# Patient Record
Sex: Male | Born: 1935 | Race: White | Hispanic: No | Marital: Married | Smoking: Former smoker
Health system: Southern US, Community
[De-identification: ages and names within clinical notes are randomized; demographics above are authoritative.]

## PROBLEM LIST (undated history)

## (undated) DIAGNOSIS — B019 Varicella without complication: Secondary | ICD-10-CM

## (undated) DIAGNOSIS — I10 Essential (primary) hypertension: Secondary | ICD-10-CM

## (undated) DIAGNOSIS — I2510 Atherosclerotic heart disease of native coronary artery without angina pectoris: Secondary | ICD-10-CM

## (undated) DIAGNOSIS — K219 Gastro-esophageal reflux disease without esophagitis: Secondary | ICD-10-CM

## (undated) DIAGNOSIS — Z85820 Personal history of malignant melanoma of skin: Secondary | ICD-10-CM

## (undated) DIAGNOSIS — I252 Old myocardial infarction: Secondary | ICD-10-CM

## (undated) DIAGNOSIS — N289 Disorder of kidney and ureter, unspecified: Secondary | ICD-10-CM

## (undated) DIAGNOSIS — N189 Chronic kidney disease, unspecified: Secondary | ICD-10-CM

## (undated) DIAGNOSIS — Z87440 Personal history of urinary (tract) infections: Secondary | ICD-10-CM

## (undated) DIAGNOSIS — B059 Measles without complication: Secondary | ICD-10-CM

## (undated) DIAGNOSIS — B269 Mumps without complication: Secondary | ICD-10-CM

## (undated) HISTORY — PX: OTHER SURGICAL HISTORY: SHX169

## (undated) HISTORY — DX: Mumps without complication: B26.9

## (undated) HISTORY — DX: Old myocardial infarction: I25.2

## (undated) HISTORY — PX: HIP SURGERY: SHX245

## (undated) HISTORY — DX: Measles without complication: B05.9

## (undated) HISTORY — DX: Varicella without complication: B01.9

## (undated) HISTORY — DX: Personal history of malignant melanoma of skin: Z85.820

## (undated) HISTORY — DX: Personal history of urinary (tract) infections: Z87.440

## (undated) SURGERY — DIALYSIS/PERMA CATHETER REMOVAL
Laterality: Right

---
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
## 1985-01-14 HISTORY — PX: OTHER SURGICAL HISTORY: SHX169

## 1996-12-22 HISTORY — PX: CORONARY ANGIOPLASTY: SHX604

## 1999-02-09 HISTORY — PX: OTHER SURGICAL HISTORY: SHX169

## 2000-12-05 HISTORY — PX: OTHER SURGICAL HISTORY: SHX169

## 2002-04-05 LAB — HM COLONOSCOPY

## 2002-05-25 HISTORY — PX: MELANOMA EXCISION: SHX5266

## 2006-07-14 ENCOUNTER — Ambulatory Visit: Payer: Self-pay

## 2006-11-10 ENCOUNTER — Ambulatory Visit: Payer: Self-pay | Admitting: Family Medicine

## 2006-11-24 DIAGNOSIS — E1121 Type 2 diabetes mellitus with diabetic nephropathy: Secondary | ICD-10-CM | POA: Insufficient documentation

## 2008-02-25 ENCOUNTER — Ambulatory Visit: Payer: Self-pay | Admitting: Family Medicine

## 2008-11-14 HISTORY — PX: TYMPANOPLASTY: SHX33

## 2009-01-25 ENCOUNTER — Ambulatory Visit: Payer: Self-pay | Admitting: Family Medicine

## 2009-02-09 ENCOUNTER — Ambulatory Visit: Payer: Self-pay | Admitting: Unknown Physician Specialty

## 2009-03-07 ENCOUNTER — Ambulatory Visit: Payer: Self-pay | Admitting: Unknown Physician Specialty

## 2010-08-02 ENCOUNTER — Ambulatory Visit: Payer: Self-pay | Admitting: Unknown Physician Specialty

## 2010-08-27 ENCOUNTER — Ambulatory Visit: Payer: Self-pay | Admitting: Unknown Physician Specialty

## 2010-08-27 DIAGNOSIS — N281 Cyst of kidney, acquired: Secondary | ICD-10-CM | POA: Insufficient documentation

## 2010-10-22 ENCOUNTER — Ambulatory Visit: Payer: Self-pay | Admitting: Vascular Surgery

## 2012-01-14 DIAGNOSIS — A048 Other specified bacterial intestinal infections: Secondary | ICD-10-CM | POA: Insufficient documentation

## 2012-02-15 HISTORY — PX: OTHER SURGICAL HISTORY: SHX169

## 2012-03-04 ENCOUNTER — Emergency Department: Payer: Self-pay | Admitting: Emergency Medicine

## 2012-03-04 LAB — CBC
HCT: 44.2 % (ref 40.0–52.0)
MCH: 29.6 pg (ref 26.0–34.0)
MCV: 89 fL (ref 80–100)
Platelet: 254 10*3/uL (ref 150–440)
RBC: 4.95 10*6/uL (ref 4.40–5.90)
RDW: 14.9 % — ABNORMAL HIGH (ref 11.5–14.5)

## 2012-03-04 LAB — COMPREHENSIVE METABOLIC PANEL
Albumin: 3.4 g/dL (ref 3.4–5.0)
Anion Gap: 9 (ref 7–16)
BUN: 29 mg/dL — ABNORMAL HIGH (ref 7–18)
Calcium, Total: 9 mg/dL (ref 8.5–10.1)
Creatinine: 1.84 mg/dL — ABNORMAL HIGH (ref 0.60–1.30)
EGFR (African American): 40 — ABNORMAL LOW
EGFR (Non-African Amer.): 35 — ABNORMAL LOW
Glucose: 176 mg/dL — ABNORMAL HIGH (ref 65–99)
Sodium: 143 mmol/L (ref 136–145)
Total Protein: 6.8 g/dL (ref 6.4–8.2)

## 2012-03-04 LAB — APTT: Activated PTT: 27.9 secs (ref 23.6–35.9)

## 2012-03-04 LAB — PROTIME-INR: Prothrombin Time: 12.6 secs (ref 11.5–14.7)

## 2012-03-04 LAB — CK TOTAL AND CKMB (NOT AT ARMC)
CK, Total: 58 U/L (ref 35–232)
CK-MB: 2 ng/mL (ref 0.5–3.6)

## 2012-03-04 LAB — TROPONIN I: Troponin-I: <0.02 ng/mL

## 2012-05-14 DIAGNOSIS — I729 Aneurysm of unspecified site: Secondary | ICD-10-CM | POA: Insufficient documentation

## 2012-05-14 DIAGNOSIS — I739 Peripheral vascular disease, unspecified: Secondary | ICD-10-CM | POA: Insufficient documentation

## 2012-05-14 DIAGNOSIS — I714 Abdominal aortic aneurysm, without rupture: Secondary | ICD-10-CM | POA: Insufficient documentation

## 2012-05-20 ENCOUNTER — Ambulatory Visit: Payer: Self-pay | Admitting: Cardiology

## 2012-10-05 ENCOUNTER — Other Ambulatory Visit: Payer: Self-pay | Admitting: Unknown Physician Specialty

## 2012-10-05 LAB — CLOSTRIDIUM DIFFICILE BY PCR

## 2012-10-07 LAB — STOOL CULTURE

## 2013-02-01 ENCOUNTER — Ambulatory Visit (INDEPENDENT_AMBULATORY_CARE_PROVIDER_SITE_OTHER): Payer: Medicare HMO | Admitting: Podiatry

## 2013-02-01 ENCOUNTER — Ambulatory Visit (INDEPENDENT_AMBULATORY_CARE_PROVIDER_SITE_OTHER): Payer: Medicare HMO

## 2013-02-01 ENCOUNTER — Encounter: Payer: Self-pay | Admitting: Podiatry

## 2013-02-01 VITALS — BP 188/86 | HR 51 | Resp 16 | Ht 71.0 in | Wt 163.0 lb

## 2013-02-01 DIAGNOSIS — M7750 Other enthesopathy of unspecified foot: Secondary | ICD-10-CM

## 2013-02-01 DIAGNOSIS — M778 Other enthesopathies, not elsewhere classified: Secondary | ICD-10-CM

## 2013-02-01 DIAGNOSIS — M109 Gout, unspecified: Secondary | ICD-10-CM

## 2013-02-01 DIAGNOSIS — M79671 Pain in right foot: Secondary | ICD-10-CM

## 2013-02-01 DIAGNOSIS — M779 Enthesopathy, unspecified: Secondary | ICD-10-CM

## 2013-02-01 DIAGNOSIS — M79609 Pain in unspecified limb: Secondary | ICD-10-CM

## 2013-02-01 NOTE — Progress Notes (Signed)
   Subjective:    Patient ID: John Robinson, male    DOB: [birth year], 80 y.o.   MRN: [id number]  HPI Comments: Its my right foot and ankle. It hurts and it was swollen. The swelling has gotten better. The swelling happened over night. i cant wear dress shoes only sneakers. All this started happening two weeks ago yesterday. i seen dr Caryn Section and he rubbed it and had blood work done. i went back to him for the results and he said it was infection in my foot or ankle. Dr Caryn Section gave me cephalexin for my foot.  Foot Pain Associated symptoms include coughing and headaches.      Review of Systems  HENT: Positive for sinus pressure and trouble swallowing.   Eyes: Negative.   Respiratory: Positive for cough.   Cardiovascular:       Calf pain when walking  Gastrointestinal: Positive for diarrhea.  Endocrine: Negative.   Genitourinary: Negative.   Musculoskeletal:       Joint pain Difficulty walking  Skin: Negative.   Allergic/Immunologic: Negative.   Neurological: Positive for headaches.  Hematological: Negative.   Psychiatric/Behavioral: Negative.        Objective:   Physical Exam: I have reviewed his past medical history medications allergies surgeries social history. His vital signs are slightly elevated today with mild increase in blood pressure. He states that this is been continuing to increase over the past few weeks. Pulses are strongly palpable right foot diminished left foot. He does have a history of peripheral vascular disease. Neurologic sensorium is intact bilateral. Deep tendon reflexes are intact bilateral muscle strength is 5 over 5 dorsiflexors plantar flexors inverters everters all intrinsic musculature is intact. Orthopedic evaluation demonstrates some tenderness on palpation overlying the lateral ankle and the dorsal lateral aspect of the right foot. Radiographic evaluation does not demonstrate any type of osseous abnormalities other than some early osteoarthritic  changes about the distal fibula. The foot is mildly warm to touch but the patient states that his for ever getting better. Radiographic evaluation does not demonstrate any major anomalies.        Assessment & Plan:  Assessment: Probable gouty capsulitis right lateral foot and ankle. Care rule out some type of associated hypertension. Concerned about renal artery stenosis possibly resulting in his hypertension.  Plan: We discussed the etiology pathology conservative versus surgical therapies at this point with this ever getting better I don't think there is any reason to treat the foot. However I did suggest that the hydrochlorothiazide may be a culprit. I also suggested that he followup with his primary Dr. or his vascular surgeon for his hypertension and to continue checking the blood pressure daily and making notes of it.

## 2013-02-02 ENCOUNTER — Emergency Department: Payer: Self-pay | Admitting: Emergency Medicine

## 2013-02-02 LAB — BASIC METABOLIC PANEL
Anion Gap: 4 — ABNORMAL LOW (ref 7–16)
BUN: 38 mg/dL — AB (ref 7–18)
CO2: 26 mmol/L (ref 21–32)
Calcium, Total: 9.5 mg/dL (ref 8.5–10.1)
Chloride: 106 mmol/L (ref 98–107)
Creatinine: 2.11 mg/dL — ABNORMAL HIGH (ref 0.60–1.30)
EGFR (African American): 34 — ABNORMAL LOW
EGFR (Non-African Amer.): 29 — ABNORMAL LOW
Glucose: 149 mg/dL — ABNORMAL HIGH (ref 65–99)
Osmolality: 284 (ref 275–301)
Potassium: 4.4 mmol/L (ref 3.5–5.1)
Sodium: 136 mmol/L (ref 136–145)

## 2013-02-02 LAB — CBC WITH DIFFERENTIAL/PLATELET
BASOS ABS: 0.1 10*3/uL (ref 0.0–0.1)
Basophil %: 0.9 %
Eosinophil #: 0.2 10*3/uL (ref 0.0–0.7)
Eosinophil %: 2.2 %
HCT: 39.5 % — ABNORMAL LOW (ref 40.0–52.0)
HGB: 13.3 g/dL (ref 13.0–18.0)
LYMPHS ABS: 2 10*3/uL (ref 1.0–3.6)
Lymphocyte %: 20.4 %
MCH: 30.1 pg (ref 26.0–34.0)
MCHC: 33.5 g/dL (ref 32.0–36.0)
MCV: 90 fL (ref 80–100)
MONOS PCT: 6.3 %
Monocyte #: 0.6 x10 3/mm (ref 0.2–1.0)
Neutrophil #: 7 10*3/uL — ABNORMAL HIGH (ref 1.4–6.5)
Neutrophil %: 70.2 %
PLATELETS: 356 10*3/uL (ref 150–440)
RBC: 4.4 10*6/uL (ref 4.40–5.90)
RDW: 14.2 % (ref 11.5–14.5)
WBC: 10 10*3/uL (ref 3.8–10.6)

## 2013-03-26 LAB — HEMOGLOBIN A1C, FINGERSTICK: Hemoglobin A1C: 6.2

## 2013-05-05 ENCOUNTER — Ambulatory Visit: Payer: Self-pay | Admitting: Family Medicine

## 2013-05-19 ENCOUNTER — Ambulatory Visit: Payer: Self-pay | Admitting: Family Medicine

## 2013-06-23 ENCOUNTER — Ambulatory Visit: Payer: Self-pay | Admitting: Family Medicine

## 2013-07-09 LAB — HEMOGLOBIN A1C, FINGERSTICK
HEMOGLOBIN A1C: 6.4
Microalb, Ur: 50

## 2013-07-14 ENCOUNTER — Ambulatory Visit: Payer: Self-pay | Admitting: Family Medicine

## 2013-10-05 LAB — HEMOGLOBIN A1C, FINGERSTICK: HEMOGLOBIN A1C: 5.7

## 2013-11-04 ENCOUNTER — Ambulatory Visit: Payer: Self-pay | Admitting: Vascular Surgery

## 2013-11-04 LAB — CBC WITH DIFFERENTIAL/PLATELET
Basophil #: 0.1 10*3/uL (ref 0.0–0.1)
Basophil %: 0.8 %
EOS ABS: 0.3 10*3/uL (ref 0.0–0.7)
Eosinophil %: 4.4 %
HCT: 30.2 % — AB (ref 40.0–52.0)
HGB: 9.9 g/dL — ABNORMAL LOW (ref 13.0–18.0)
LYMPHS ABS: 1.5 10*3/uL (ref 1.0–3.6)
Lymphocyte %: 19.5 %
MCH: 29.3 pg (ref 26.0–34.0)
MCHC: 32.7 g/dL (ref 32.0–36.0)
MCV: 90 fL (ref 80–100)
Monocyte #: 0.6 x10 3/mm (ref 0.2–1.0)
Monocyte %: 8 %
Neutrophil #: 5 10*3/uL (ref 1.4–6.5)
Neutrophil %: 67.3 %
Platelet: 233 10*3/uL (ref 150–440)
RBC: 3.37 10*6/uL — AB (ref 4.40–5.90)
RDW: 14.4 % (ref 11.5–14.5)
WBC: 7.5 10*3/uL (ref 3.8–10.6)

## 2013-11-04 LAB — BASIC METABOLIC PANEL
Anion Gap: 9 (ref 7–16)
BUN: 65 mg/dL — ABNORMAL HIGH (ref 7–18)
CO2: 23 mmol/L (ref 21–32)
Calcium, Total: 8.4 mg/dL — ABNORMAL LOW (ref 8.5–10.1)
Chloride: 112 mmol/L — ABNORMAL HIGH (ref 98–107)
Creatinine: 4.02 mg/dL — ABNORMAL HIGH (ref 0.60–1.30)
EGFR (Non-African Amer.): 15 — ABNORMAL LOW
GFR CALC AF AMER: 19 — AB
Glucose: 179 mg/dL — ABNORMAL HIGH (ref 65–99)
Osmolality: 310 (ref 275–301)
POTASSIUM: 4.6 mmol/L (ref 3.5–5.1)
Sodium: 144 mmol/L (ref 136–145)

## 2013-11-04 LAB — PROTIME-INR
INR: 1
PROTHROMBIN TIME: 12.9 s (ref 11.5–14.7)

## 2013-11-11 ENCOUNTER — Ambulatory Visit: Payer: Self-pay | Admitting: Vascular Surgery

## 2013-11-28 ENCOUNTER — Emergency Department: Payer: Self-pay | Admitting: Emergency Medicine

## 2013-11-28 LAB — COMPREHENSIVE METABOLIC PANEL
ALK PHOS: 166 U/L — AB
ALT: 20 U/L
ANION GAP: 7 (ref 7–16)
AST: 24 U/L (ref 15–37)
Albumin: 3.3 g/dL — ABNORMAL LOW (ref 3.4–5.0)
BILIRUBIN TOTAL: 0.4 mg/dL (ref 0.2–1.0)
BUN: 70 mg/dL — ABNORMAL HIGH (ref 7–18)
CALCIUM: 8.7 mg/dL (ref 8.5–10.1)
CHLORIDE: 112 mmol/L — AB (ref 98–107)
Co2: 25 mmol/L (ref 21–32)
Creatinine: 4.39 mg/dL — ABNORMAL HIGH (ref 0.60–1.30)
EGFR (African American): 17 — ABNORMAL LOW
EGFR (Non-African Amer.): 14 — ABNORMAL LOW
GLUCOSE: 148 mg/dL — AB (ref 65–99)
Osmolality: 310 (ref 275–301)
Potassium: 4.3 mmol/L (ref 3.5–5.1)
Sodium: 144 mmol/L (ref 136–145)
Total Protein: 7 g/dL (ref 6.4–8.2)

## 2013-11-28 LAB — PROTIME-INR
INR: 0.9
Prothrombin Time: 12.3 secs (ref 11.5–14.7)

## 2013-11-28 LAB — CBC
HCT: 36.6 % — ABNORMAL LOW (ref 40.0–52.0)
HGB: 11.7 g/dL — ABNORMAL LOW (ref 13.0–18.0)
MCH: 28.7 pg (ref 26.0–34.0)
MCHC: 31.9 g/dL — ABNORMAL LOW (ref 32.0–36.0)
MCV: 90 fL (ref 80–100)
Platelet: 307 10*3/uL (ref 150–440)
RBC: 4.06 10*6/uL — ABNORMAL LOW (ref 4.40–5.90)
RDW: 14.1 % (ref 11.5–14.5)
WBC: 11.6 10*3/uL — ABNORMAL HIGH (ref 3.8–10.6)

## 2013-11-28 LAB — TROPONIN I: Troponin-I: <0.02 ng/mL

## 2013-11-28 LAB — APTT: Activated PTT: 30.9 secs (ref 23.6–35.9)

## 2014-02-02 LAB — HEMOGLOBIN A1C, FINGERSTICK: Hemoglobin A1C: 6.4

## 2014-03-18 LAB — LIPID PANEL
BUN: 68
CREATININE: 5.66
Glucose: 188
HCT: 30.7
HEMOGLOBIN A1C: 6.3
HGB: 10 g/dL
PLATELETS: 217
POTASSIUM: 5.1 mmol/L
Sodium: 140
WBC: 8.5

## 2014-03-30 ENCOUNTER — Inpatient Hospital Stay: Payer: Self-pay | Admitting: Internal Medicine

## 2014-04-01 DIAGNOSIS — R079 Chest pain, unspecified: Secondary | ICD-10-CM | POA: Diagnosis not present

## 2014-04-06 DIAGNOSIS — I745 Embolism and thrombosis of iliac artery: Secondary | ICD-10-CM | POA: Insufficient documentation

## 2014-04-06 DIAGNOSIS — Z9889 Other specified postprocedural states: Secondary | ICD-10-CM | POA: Insufficient documentation

## 2014-04-06 DIAGNOSIS — I219 Acute myocardial infarction, unspecified: Secondary | ICD-10-CM | POA: Insufficient documentation

## 2014-04-08 ENCOUNTER — Ambulatory Visit: Payer: Self-pay | Admitting: Family Medicine

## 2014-04-10 ENCOUNTER — Emergency Department: Payer: Self-pay | Admitting: Emergency Medicine

## 2014-04-10 LAB — CBC
HCT: 29.2 % — ABNORMAL LOW (ref 40.0–52.0)
HGB: 9.7 g/dL — AB (ref 13.0–18.0)
MCH: 29.2 pg (ref 26.0–34.0)
MCHC: 33 g/dL (ref 32.0–36.0)
MCV: 89 fL (ref 80–100)
Platelet: 262 10*3/uL (ref 150–440)
RBC: 3.3 10*6/uL — ABNORMAL LOW (ref 4.40–5.90)
RDW: 15.4 % — AB (ref 11.5–14.5)
WBC: 9.1 10*3/uL (ref 3.8–10.6)

## 2014-04-10 LAB — BASIC METABOLIC PANEL
Anion Gap: 9 (ref 7–16)
BUN: 99 mg/dL — ABNORMAL HIGH
CHLORIDE: 112 mmol/L — AB
CO2: 20 mmol/L — AB
CREATININE: 7.13 mg/dL — AB
Calcium, Total: 9 mg/dL
EGFR (Non-African Amer.): 7 — ABNORMAL LOW
GFR CALC AF AMER: 8 — AB
GLUCOSE: 164 mg/dL — AB
POTASSIUM: 4.7 mmol/L
Sodium: 141 mmol/L

## 2014-04-10 LAB — PROTIME-INR
INR: 0.9
Prothrombin Time: 12 secs

## 2014-04-10 LAB — TROPONIN I: TROPONIN-I: 0.03 ng/mL

## 2014-04-11 LAB — CBC WITH DIFFERENTIAL/PLATELET
BASOS ABS: 0.1 10*3/uL (ref 0.0–0.1)
Basophil %: 0.9 %
EOS PCT: 4.4 %
Eosinophil #: 0.3 10*3/uL (ref 0.0–0.7)
HCT: 28.2 % — AB (ref 40.0–52.0)
HGB: 8.8 g/dL — AB (ref 13.0–18.0)
LYMPHS PCT: 17 %
Lymphocyte #: 1.2 10*3/uL (ref 1.0–3.6)
MCH: 28.3 pg (ref 26.0–34.0)
MCHC: 31.1 g/dL — ABNORMAL LOW (ref 32.0–36.0)
MCV: 91 fL (ref 80–100)
Monocyte #: 0.6 x10 3/mm (ref 0.2–1.0)
Monocyte %: 8.7 %
NEUTROS PCT: 69 %
Neutrophil #: 4.9 10*3/uL (ref 1.4–6.5)
Platelet: 236 10*3/uL (ref 150–440)
RBC: 3.1 10*6/uL — AB (ref 4.40–5.90)
RDW: 16.1 % — ABNORMAL HIGH (ref 11.5–14.5)
WBC: 7.2 10*3/uL (ref 3.8–10.6)

## 2014-04-11 LAB — TROPONIN I: TROPONIN-I: 0.05 ng/mL — AB

## 2014-04-11 LAB — APTT: Activated PTT: 33.2 secs (ref 23.6–35.9)

## 2014-04-11 LAB — HEPARIN LEVEL (UNFRACTIONATED): Anti-Xa(Unfractionated): 2 IU/mL — ABNORMAL HIGH (ref 0.30–0.70)

## 2014-04-12 DIAGNOSIS — I2510 Atherosclerotic heart disease of native coronary artery without angina pectoris: Secondary | ICD-10-CM | POA: Insufficient documentation

## 2014-04-12 DIAGNOSIS — I160 Hypertensive urgency: Secondary | ICD-10-CM | POA: Insufficient documentation

## 2014-04-12 DIAGNOSIS — N186 End stage renal disease: Secondary | ICD-10-CM | POA: Insufficient documentation

## 2014-04-12 DIAGNOSIS — R079 Chest pain, unspecified: Secondary | ICD-10-CM | POA: Insufficient documentation

## 2014-05-02 ENCOUNTER — Ambulatory Visit
Admit: 2014-05-02 | Disposition: A | Discharge status: Home / Self Care | Payer: Self-pay | Attending: Vascular Surgery | Admitting: Vascular Surgery

## 2014-05-07 NOTE — Op Note (Signed)
PATIENT NAME:  John Robinson, SIPPEL MR#:  [id number] DATE OF BIRTH:  1932-02-06  DATE OF PROCEDURE:  11/11/2013  PREOPERATIVE DIAGNOSES: 1.  Chronic kidney disease nearing dialysis dependence.  2.  Abdominal aortic aneurysm.  3.  Peripheral vascular disease.  4.  Hypertension.   POSTOPERATIVE DIAGNOSES: 1.  Chronic kidney disease nearing dialysis dependence.  2.  Abdominal aortic aneurysm.  3.  Peripheral vascular disease.  4.  Hypertension.   PROCEDURE:  Left brachiocephalic arteriovenous fistula creation.   SURGEON:  Algernon Huxley, MD   ANESTHESIA:  General.   ESTIMATED BLOOD LOSS:  25 mL.   INDICATION FOR PROCEDURE:  This is a 81 year old gentleman with chronic kidney disease nearing dialysis dependence. He was referred to [place] for evaluation for AV fistula creation. We planned left arm AV fistula creation. On his noninvasive studies, his cephalic vein looked marginal or unusable with basilic vein that looked to be adequate. I have told him we would explore his left arm, and if the cephalic vein was usable, we would use this, and if not, we would plan on placing a basilic AV fistula, which would require a second stage operation. He agreed with proceeding. Risks and benefits were discussed. Informed consent was obtained.   DESCRIPTION OF PROCEDURE:  The patient was brought to the operative suite, and after an adequate level of general anesthesia was obtained, the left upper extremity was sterilely prepped and draped and a sterile surgical field was created. A small incision was created at the antecubital fossa, and I identified a basilic vein which was usable. I then extended the incision about 1 to 1.5 cm laterally to evaluate the cephalic vein. The median antecubital vein and the cephalic vein came over. The cephalic vein actually looked like a potentially usable vein. It was a little smaller than the basilic vein but appeared to be of reasonable size. I elected to explore this and pass dilators,  and if this were usable, use this for fistula creation. The brachial artery was dissected out and encircled with vessel loops proximally and distally. Control was then pulled up on the vessel loops, and and anterior wall arteriotomy was created with an 11 blade and extended with Potts scissors. The patient was given 3000 units of intravenous heparin. Two cephalic vein branches were ligated and divided between silk ties. It was ligated distally in the median antecubital vein prior to its joining the basilic vein. It was marked for orientation. I then sequentially passed [facility] dilators up to 3 mm without any difficulty and felt this would be our best option for fistula creation. It was cut and beveled to match the arteriotomy, and both vessels were locally heparinized. An anastomosis was created with a running 6-0 Prolene suture in the usual fashion. The vessel was flushed and de-aired prior to release of control. Two 6-0 Prolene patch sutures were used for hemostasis, and hemostasis was achieved. Surgicel was placed. There was an excellent thrill present within the AV fistula and a good pulse in the brachial artery distally. At this point, I elected to terminate the procedure. The wound was closed with 3-0 Vicryl and 4-0 Monocryl. Dermabond was placed as a dressing. The patient was awakened from anesthesia and taken to the recovery room in stable condition, having tolerated the procedure well.    ____________________________ Algernon Huxley, MD jsd:nb D: 11/11/2013 14:31:24 ET T: 11/12/2013 00:21:28 ET JOB#: [id number]  cc: Algernon Huxley, MD, <Dictator> Algernon Huxley MD ELECTRONICALLY SIGNED 11/17/2013 8:56

## 2014-05-15 NOTE — Discharge Summary (Signed)
PATIENT NAME:  John Robinson, John Robinson MR#:  [id number] DATE OF BIRTH:  Sep 19, 1932  DATE OF ADMISSION:  03/30/2014 DATE OF DISCHARGE:  04/02/2014  ADMITTING DIAGNOSIS:   Malignant hypertension.   DISCHARGE DIAGNOSES:    1. Malignant essential hypertension.  2. Hypertensive encephalopathy with headache.  3. Hyperkalemia due to renal failure. 4. Acidosis due to renal failure.  5. Chest pain with hypertension episodes while in the hospital, resolved.  6. Normal echocardiogram. 7. History of hypertension. 8. History of hyperlipidemia. 9. History of coronary artery disease status post coronary artery bypass grafting.  10. Peripheral vascular disease status post multiple bypass surgeries.  11. Tobacco abuse in remote past   12. Renal artery stenosis status post stent placement in the remote past. 13. Chronic kidney disease stage V. 14. History of gout, stable.    DISCHARGE CONDITION:  Stable.   DISCHARGE MEDICATIONS:  The patient is to continue omeprazole 20 mg p.o. daily as needed, Brilinta 90 mg twice daily, clonidine 0.1 mg 3 times daily, cilostazol 100 mg twice daily, Nitrostat 0.4 mg sublingual every 5 minutes as needed, atorvastatin 80 mg p.o. daily,  glipizide 5 mg p.o. daily, Welchol 625 mg 3 tablets twice daily, multivitamin once daily, aspirin 81 mg p.o. daily, sodium bicarbonate 650 mg twice daily, labetalol 100 mg twice daily.   The patient is not to take metoprolol or hydrochlorothiazide.     HOME OXYGEN:   None.  DIET:   Low sodium, low fat, low cholesterol, carbohydrate controlled diet, regular consistency.  ACTIVITY LIMITATIONS:  As tolerated.   FOLLOWUP APPOINTMENT: With Dr. Caryn Section in 2 days after discharge, Dr. Merita Norton 2 days after discharge, primary cardiology in 2 days after discharge.    CONSULTANTS:  Care management, social work, Dr. Candiss Norse.   RADIOLOGIC STUDIES:   Chest x-ray PA and lateral 03/30/2014, revealing chronic bronchitic changes without acute abnormality.    The  patient is a 81 year old Caucasian male with past medical history significant for history of CKD who presents to the hospital with complaints of elevated blood pressure as well as headaches. Please refer to Dr. Keenan Bachelor admission note on 03/30/2014.  On arrival to the hospital, the patient's vital signs:  Temperature was 97.9, pulse was 64, respiratory rate was 18, blood pressure 214/74.  O2 saturations were 100% on room air.   Physical exam was unremarkable.   The patient's lab data done on arrival to the Emergency Room revealed BUN and creatinine of 81 and 6.45, glucose 146, potassium 5.3, bicarbonate 18. Calcium level was low at 8.8. Liver enzymes: Albumin level of 2.9, otherwise liver enzymes were unremarkable. Cardiac enzymes x 5 were within normal limits.  The patient's white blood cell count was normal at 9. Hemoglobin was 10. Platelet count was 266,000.   Absolute neutrophil count was 6.6. Urinalysis revealed more than 500 protein, 1 red blood cell, 1 white blood cell, no bacteria, less than 1 epithelial cell was found.  EKG showed sinus rhythm with first degree AV block, right bundle branch block, as well as right anterior fascicular block.   The patient's radiologic studies, chest x-ray, showed chronic bronchitic changes.  The patient was admitted to the hospital for further evaluation.  He was started on labetalol and hydralazine and was initiated on diuretic Lasix. With this, his condition significantly improved.   His blood pressure improved and he felt much more comfortable.   With blood pressure improvement, his headache also resolved.  He was seen and followed by Dr. Merita Norton while  in the hospital and felt that patient is approaching very closely to end-stage renal disease, need for hemodialysis, although no hemodialysis was performed while he was in the hospital.  Since the patient's malignant essential hypertension resolved and his condition overall improved, it was felt the patient is stable to be  discharged home today on 04/02/2014.  He is to continue on medications for blood pressure management. In regards to hyperkalemia, the patient's hyperkalemia resolved with diuretic Lasix. It is recommended to initiate the patient on Lasix if needed if he develops more swelling or more acceleration of blood pressure. In regards to acidosis, the patient's acidosis improved with bicarbonate orally. The patient is to continue bicarbonate at home and followup with nephrologist for further recommendations.  Regarding chest pains, the patient developed an episode of hypertension while he was in the hospital and with that, he had mild chest discomfort. His cardiac enzymes were cycled and they were completely within normal limits.   The patient was seen by Dr. Clayborn Bigness although no note by Dr. Clayborn Bigness was available.  I  had a chance to discuss the case with DrClayborn Bigness who felt the patient should followup with his primary cardiologist for further recommendations, possible stress test as outpatient.   The patient did have echocardiogram while he was in the hospital which revealed ejection fraction of 65-70%, normal global left ventricular systolic function, impaired relaxation pattern of left ventricular diastolic filling, mild concentric left ventricular hypertrophy was noted as well as mildly increased left ventricular posterior wall thickness.  No changes in medication regimen except of blood pressure management were recommended by Dr. Clayborn Bigness.   In regards to his chronic medical problems such as hyperlipidemia, coronary artery disease, peripheral vascular disease, CKD, gout, the patient is to continue his outpatient management and followup.   The patient is being discharged in stable condition with the above mentioned medications and followup.   On the date of discharge, temperature was 97.6, pulse was 76, respiration equals 18, blood pressure ranging from 09-735 systolic and 32-99 diastolic.  O2 saturations were 97-98%  on room air at rest as well as on exertion.     TIME SPENT:   Forty minutes.   CC:   Primary cardiology.    ____________________________ Theodoro Grist, MD rv:tr D: 04/02/2014 16:12:21 ET T: 04/02/2014 16:18:15 ET JOB#: [id number]  cc: Theodoro Grist, MD, <Dictator> Kirstie Peri. Caryn Section, MD Darrick Penna. Woodfin Ganja, MD (Dictation Anomaly)  Tiye Huwe MD ELECTRONICALLY SIGNED 04/17/2014 20:38

## 2014-05-15 NOTE — Op Note (Signed)
PATIENT NAME:  John Robinson, John Robinson MR#:  [id number] DATE OF BIRTH:  December 20, 1932  DATE OF PROCEDURE:  05/02/2014  PREOPERATIVE DIAGNOSES: 1. End-stage renal disease.  2. Poorly maturing.  3. Infiltration with attempted dialysis left arm arteriovenous fistula.  4. Abdominal aortic aneurysm.  5. Peripheral arterial disease, status post previous surgery.  6. Coronary disease.  7. Hypertension.  8. Diabetes.   POSTOPERATIVE DIAGNOSES: 1. End-stage renal disease.  2. Poorly maturing.  3. Infiltration with attempted dialysis left arm arteriovenous fistula.  4. Abdominal aortic aneurysm.  5. Peripheral arterial disease, status post previous surgery.  6. Coronary disease.  7. Hypertension.  8. Diabetes.   PROCEDURES: 1. Ultrasound guidance for vascular access to left brachiocephalic arteriovenous fistula.  2. Left upper extremity fistulogram and central venogram.  3. Percutaneous transluminal angioplasty of proximal and mid upper arm cephalic vein with a 5 mm diameter angioplasty balloon.  4. Percutaneous transluminal angioplasty of cephalic vein and subclavian confluence with 5 mm diameter angioplasty balloon.  5. Viabahn covered stent placement to proximal to mid upper arm cephalic vein with 6 mm diameter x 10 cm length Viabahn covered stent for stenosis and extravasation after angioplasty.  6. Viabahn covered stent placement 6 mm diameter x 15 cm length covered stent to the cephalic vein and subclavian vein confluence and the proximal upper arm cephalic vein for greater than 50% residual stenosis in 2 locations after angioplasty.   SURGEON: Algernon Huxley, MD   ANESTHESIA: Local with moderate conscious sedation.   ESTIMATED BLOOD LOSS: 25 mL.   INDICATION FOR PROCEDURE: An 81 year old gentleman with end-stage renal disease. He had a left brachiocephalic AV fistula that was recently attempted to use for dialysis with immediate infiltration and poor maturation of the AV fistula. Even though it  had been  present for  several months, it was not yet usable for dialysis.  Fistulogram was performed for further evaluation and potential treatment. Risks and benefits were discussed. Informed consent was obtained.   DESCRIPTION OF PROCEDURE: The patient is brought to the vascular suite. The left upper extremity was sterilely prepped and draped and a sterile surgical field was created.  It was accessed near the arterial and venous anastomosis with a micropuncture needle under direct ultrasound guidance and a permanent image was recorded. A micropuncture wire and sheath were placed. Imaging through this showed a near occlusive stenosis in the proximal upper arm cephalic vein with collaterals around this. The vessel then continued with significant narrowing and tortuosity.  There was then a separate and distinct near occlusive stenosis within about 3 cm from the cephalic vein and subclavian vein confluence from the cephalic vein. The central venous circulation was then patent.  This was actually quite difficult to navigate through due to the small size of the vessel and the tortuosity.  Using Glidewire and a Kumpe catheter, I was able to navigate through the first stenosis below was unable to get  past the cephalic vein and subclavian confluence stenosis. I had to use a gold tip Glidewire and a Glide catheter to get across this and confirm intraluminal flow in the subclavian vein. I then replaced an 0.018 wire. A 5 mm diameter x 15 cm length angioplasty balloon was then taken.  I initially treated at the cephalic vein and subclavian vein confluence at burst pressure.  A narrowed area at this location would not budge and remained highly stenotic. I then transition to the separate and distinct location in the proximal and mid upper arm  cephalic vein. A 5 mm diameter angioplasty balloon was again inflated at about 12 to 14 atmospheres.  The waste broke; however, there was extravasation after angioplasty and residual  stenosis was seen. There was also the separate and distinct residual stenosis near the cephalic vein and subclavian confluence.  For both of these lesions, I elected to place two Viabahn covered stents.  A 6 mm diameter x 15 cm length Viabahn covered stent was placed within 1 cm of the cephalic vein and subclavian vein confluence and the cephalic vein encompassing the lesion and then a separate 6 mm diameter x 10 cm length Viabahn covered stent was placed in the proximal down to almost the mid upper arm cephalic vein to cover the area of extravasation and residual stenosis.  High pressure balloon was used to open the stent after placement and a completion angiogram following this showed some spasm in the vein just proximal to the stent were an area of stenosis was not present previously. This improved a little with some nitroglycerin and removal of the wire. The stents were now widely patent with the area of stenosis being resolved and the central venous circulation remained patent. At this point, I elected to terminate the procedure. The sheath was removed, 4-0 Monocryl pursestring suture was placed, pressure was held, sterile dressing was placed. The patient tolerated the procedure well and was taken to the recovery room in stable condition.    ____________________________ Algernon Huxley, MD jsd:tr D: 05/02/2014 17:04:00 ET T: 05/02/2014 20:30:15 ET JOB#: [id number]  cc: Algernon Huxley, MD, <Dictator> Algernon Huxley MD ELECTRONICALLY SIGNED 05/05/2014 12:54

## 2014-05-15 NOTE — H&P (Signed)
PATIENT NAME:  John Robinson, John Robinson MR#:  [id number] DATE OF BIRTH:  Jan 27, 1932  DATE OF ADMISSION:  03/30/2014  PRIMARY CARE PHYSICIAN: Kirstie Peri. Caryn Section, MD   CARDIOLOGIST: Isaias Cowman, MD    NEPHROLOGIST:  Dr. 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 Apparently patient was doing well up until the morning of this admission when he decided to go to his primary care physician, Dr. Caryn Section. He took his usual home medications, however, on arrival to PCP's office his blood pressure was noted to be high at 200s. He was sent home with instructions to take some clonidine. He returned home, took clonidine, however, his blood pressure remained  very high. He was also having headaches and decided to come to the Emergency Room for further evaluation. In the Emergency Room, his creatinine was noted to be 6.45. He was noted to be edematous with lower extremity swelling and his blood pressure remained high 214/74. Hospitalist services were contacted for admission.   PAST MEDICAL HISTORY: Significant for history of coronary artery disease, status post stents, history of aortic aneurysm operation for the same, history of peripheral vascular disease, status post iliofemoral bypass, since 1987 multiple procedures, hypertension, hyperlipidemia, history of carotid artery stenosis, history of CKD stage V.   SURGERIES:  Appendectomy in the remote past, 30 years ago patient had a hip fracture as well as arm fracture after fall, history of stenting in his lower extremities as well as in his kidneys as well as his heart.   ALLERGIES: None.   FAMILY HISTORY: No diabetes in patient's family. The patient's father had black lung, also lung  cancer, died of it. The patient's mother had pancreatic cancer.   SOCIAL HISTORY: The patient is married and has 2 children who live close by, used to smoke 1 pack a day for at least 60 years, quit 1 year ago. He was a Administrator.   REVIEW OF SYSTEMS:  CONSTITUTIONAL: Positive for headaches for the past 1 week, some blurring of vision intermittently, some sleepiness in the daytime and snoring, sinus congestion, coughing as well as intermittent wheezing, some phlegm production, which is white, admits of having some lower extremity swelling, bruising for the past 2 or 3 years. Denies fever, chills, fatigue, weakness, weight loss or gain.   EYES: Denies any double vision, glaucoma, or cataracts.   EARS: Denies tinnitus, sinus pains,  difficulty swallowing.  RESPIRATORY:  Denies any hemoptysis, asthma, COPD.  CARDIOVASCULAR: Denies any chest pains, orthopnea, arrhythmias, palpitations or syncope.  GASTROINTESTINAL: Denies any nausea, vomiting, diarrhea, or constipation.  GENITOURINARY: Denies dysuria, hematuria, frequency, incontinence.  ENDOCRINE: Denies any polydipsia, nocturia, thyroid problems, heat or cold intolerance or thirst.  HEMATOLOGIC: Denies anemia, easy bruising, bleeding, or swollen glands.  SKIN: Denies any acne, rashes, lesions, change in moles.  MUSCULOSKELETAL: Denies arthritis, cramps, swelling.  NEUROLOGIC: Denies numbness, epilepsy, or tremor.  PSYCHIATRIC: Denies anxiety, insomnia, depression.   PHYSICAL EXAMINATION:  VITAL SIGNS:  On arrival to the hospital patient's vital signs: Temperature was 97.9, pulse was 64, respiration rate was 18, blood pressure 170/73, however, upon further evaluation his blood pressure is 214/74, O2 saturations were 100% on room air.  GENERAL: This is a well-developed, well-nourished Caucasian male in no significant distress laying  on the stretcher.  HEENT: His pupils equal, reactive to light. Extraocular movements intact, no icterus , no  conjunctivitis. Has normal hearing. No pharyngeal erythema. Mucosa is moist.  NECK: No masses. Supple, nontender. Thyroid is not enlarged. No adenopathy. No JVD or carotid bruits bilaterally. Full range of motion.  LUNGS: Clear to auscultation but diminished breath sounds bilaterally especially posteriorly at bases.  No rales, rhonchi, or wheezing. No labored inspirations, increased dullness to percussion, not in overt respiratory distress.  CARDIOVASCULAR: S1, S2 appreciated. Rhythm is regular. Faint 2/6 systolic murmur was heard precordial.   CHEST:  Nontender to palpation.  EXTREMITIES:  Two plus lower extremity edema.  No calf tenderness or cyanosis was noted.  ABDOMEN: Soft, nontender. Bowel sounds normal. No splenomegaly or masses were noted.  RECTAL: Deferred.  MUSCLE STRENGTH: Able to move all extremities. No cyanosis, degenerative joint disease, or kyphosis. No CVA tenderness on percussion.  SKIN: Did not reveal any rashes, lesions, erythema, nodularity, induration. It was warm and dry to palpation. The patient did have lots of bruises throughout his upper extremities.  LYMPHATIC: No adenopathy in the cervical region.  NEUROLOGIC: Cranial nerves grossly intact. Sensory is intact. No dysarthria or aphasia. The patient is alert, oriented to time, person, and place, cooperative. Memory is somewhat impaired but no significant confusion, agitation, or depression noted.   EKG: Sinus rhythm with first degree AV block, right bundle branch block, left anterior fascicular block, nonspecific ST, T changes, no significant change since prior EKG done in November 2015.   LABORATORY DATA: BMP: Glucose of 146, BUN and creatinine were 81 and 6.45, potassium 5.3, bicarbonate 18, calcium 8.8. Liver enzymes not checked. Cardiac enzymes: Troponin was less than 0.03. White blood cell count is normal at 9.0, hemoglobin 10.0, platelet count is 256.  Absolute neutrophil count is 6.6. Urinalysis: Straw clear urine, 50  mg/dL of glucose, negative for bilirubin or ketones, specific gravity 1.005, pH was 5.0, 1+ blood, more than 500 protein, negative for nitrites or leukocyte esterase, 1 red blood cell, 1 white blood cell, no bacteria, less than 1 epithelial cell, amorphous crystals were also present.   RADIOLOGIC STUDIES: None.   ASSESSMENT AND PLAN:  1.  Malignant essential hypertension. Admit patient to medical floor. Start him on labetalol IV as well as orally, continue his outpatient medications, add metoprolol, also add hydralazine as needed.  2.  Headache likely hypertensive encephalopathy. Follow headaches clinically with blood pressure improvement.  3.  Acute on chronic renal failure. We will start patient on Lasix for fluid overload. We will follow patient's function closely. We will get nephrology consultation.  4.  Hyperkalemia. We will continue with Lasix and add bicarbonate, following potassium levels closely.  5.  Acidosis likely due to renal failure. Continue sodium bicarbonate orally when blood pressure is better controlled.   TIME SPENT: 50 minutes.    ____________________________ Theodoro Grist, MD rv:AT D: 03/30/2014 19:26:51 ET T: 03/31/2014 00:39:38 ET JOB#: [id number]  cc: Theodoro Grist, MD, <Dictator> Kirstie Peri. Caryn Section, MD  Theodoro Grist MD ELECTRONICALLY SIGNED 04/17/2014 20:30

## 2014-05-15 NOTE — Consult Note (Signed)
PATIENT NAME:  John Robinson, John Robinson MR#:  [id number] DATE OF BIRTH:  1932/07/05  DATE OF CONSULTATION:  04/01/2014  REFERRING PHYSICIAN:  Theodoro Grist, MD  CONSULTING PHYSICIAN:  Ramondo Dietze D. Clayborn Bigness, MD  PRIMARY PHYSICIAN: Kirstie Peri. Caryn Section, MD   CARDIOLOGIST: Isaias Cowman, MD  NEPHROLOGIST: Murlean Iba, MD   INDICATION: Hypertensive urgency and angina.   HISTORY OF PRESENT ILLNESS: The patient is a 81 year old white male with a history of chronic renal insufficiency, stage 5; a history of coronary artery disease; peripheral vascular disease, multiple interventions; hypertension; hyperlipidemia; who presented to the hospital with elevated blood pressure difficult to control at home, systolics of 416. Seen by his primary physician and advised to come to the Emergency Room. He was given clonidine with some improvement, but symptoms persisted. Noted to have elevated creatinine and lower leg swelling with his elevated blood pressure. Complains of vague chest pain symptoms and anginal symptoms as well.   PAST MEDICAL HISTORY: Coronary artery disease, peripheral vascular disease, aortic aneurysm, claudication, hypertension, hyperlipidemia, carotid stenosis, renal insufficiency.   PAST SURGICAL HISTORY: Appendectomy, arm surgery,  fracture, stenting of lower extremity, aneurysm repair.   ALLERGIES: None.   FAMILY HISTORY: Black lung, prostate cancer.   SOCIAL HISTORY: Married, 2 children. Long history of smoking but quit. Retired Administrator.  REVIEW OF SYSTEMS: No blackout spell, no syncope. He has had a headache. Some blurred vision. No nausea or vomiting. Denies fever, chills, or sweats. No weight loss, no weight gain. No hemoptysis, hematemesis. No bright red blood per rectum. No vision change or hearing change. Denies any significant sputum production or cough.   PHYSICAL EXAMINATION:  VITAL SIGNS: Blood pressure was 200/70, pulse of 65, respiratory rate of 14, afebrile.  HEENT:  Normocephalic, atraumatic. Pupils equal, round, reactive to light.  NECK: Supple. No significant JVD, bruits, or adenopathy.  LUNGS: Bilateral rhonchi. No wheezing or rales.  HEART: Regular rate and rhythm, S4, systolic ejection murmur at the apex. PMI nondisplaced.  ABDOMEN: Benign. EXTREMITIES: Bilateral decreased pulses with healed scars.  NEUROLOGIC: Intact.  SKIN: Normal.   EKG: Sinus rhythm, first-degree AV block, right bundle branch block, nonspecific ST-T wave changes, left anterior hemiblock. EKG essentially unchanged.  LABORATORY DATA: Glucose 146, BUN of 81, creatinine of 6.45, potassium 5.3, bicarbonate of 18, calcium 8.8. Troponin 0.03. White count of 9, hemoglobin of 10, platelet count of 256,000. UA unremarkable.   ASSESSMENT:  1.  Malignant hypertension.  2.  Headaches.  3.  Acute on chronic renal insufficiency.  4.  Hyperkalemia.  5.  Mild acidosis.  6.  Peripheral vascular disease.  7.  Chronic obstructive pulmonary disease.   PLAN: 1.  Recommend aggressive blood pressure therapy. Continue labetalol. Hydralazine also will be helpful. Continue metoprolol.  2.  For headaches, probably related to encephalopathy related to hypertension. Blood pressure control should be paramount. Mild to moderate pain, aggressive therapy intravenously will be helpful.  3.  For renal insufficiency, recommend nephrology input. The patient has evident dialysis in his future. Hopefully we will try to avoid that. Avoid ACE inhibitors for now, as well. 4.  Hyperkalemia. Lasix therapy. Add low-dose bicarbonate. If it does not respond, consider Kayexalate.  5.  Acidosis. Probably related to renal insufficiency. 6.  Continue blood pressure management and control as necessary. 7.  Inhalers as necessary for COPD symptoms. 8.  Consider statin therapy for hyperlipidemia. Treat the patient medically for now. Hopefully, the blood pressure will improve.    ____________________________ Loran Senters.  John Pryer,  MD ddc:ST D: 04/02/2014 18:36:00 ET T: 04/02/2014 22:46:01 ET JOB#: [id number]  cc: Smokey Melott D. Clayborn Bigness, MD, <Dictator> Yolonda Kida MD ELECTRONICALLY SIGNED 04/04/2014 13:33

## 2014-05-17 ENCOUNTER — Other Ambulatory Visit: Payer: Self-pay | Admitting: Vascular Surgery

## 2014-05-17 DIAGNOSIS — Z992 Dependence on renal dialysis: Principal | ICD-10-CM

## 2014-05-17 DIAGNOSIS — N186 End stage renal disease: Secondary | ICD-10-CM

## 2014-05-18 ENCOUNTER — Encounter
Admission: RE | Disposition: A | Discharge status: Home / Self Care | Payer: Commercial Managed Care - HMO | Source: Ambulatory Visit | Attending: Vascular Surgery

## 2014-05-18 ENCOUNTER — Ambulatory Visit
Admission: RE | Admit: 2014-05-18 | Discharge: 2014-05-18 | Disposition: A | Discharge status: Home / Self Care | Payer: Commercial Managed Care - HMO | Source: Ambulatory Visit | Attending: Vascular Surgery | Admitting: Vascular Surgery

## 2014-05-18 ENCOUNTER — Encounter: Payer: Self-pay | Admitting: *Deleted

## 2014-05-18 ENCOUNTER — Ambulatory Visit: Payer: Self-pay

## 2014-05-18 DIAGNOSIS — Z992 Dependence on renal dialysis: Secondary | ICD-10-CM | POA: Diagnosis not present

## 2014-05-18 DIAGNOSIS — Y999 Unspecified external cause status: Secondary | ICD-10-CM | POA: Insufficient documentation

## 2014-05-18 DIAGNOSIS — I714 Abdominal aortic aneurysm, without rupture: Secondary | ICD-10-CM | POA: Diagnosis not present

## 2014-05-18 DIAGNOSIS — N186 End stage renal disease: Secondary | ICD-10-CM | POA: Diagnosis not present

## 2014-05-18 DIAGNOSIS — T82898A Other specified complication of vascular prosthetic devices, implants and grafts, initial encounter: Secondary | ICD-10-CM | POA: Diagnosis present

## 2014-05-18 DIAGNOSIS — F17200 Nicotine dependence, unspecified, uncomplicated: Secondary | ICD-10-CM | POA: Diagnosis not present

## 2014-05-18 DIAGNOSIS — I701 Atherosclerosis of renal artery: Secondary | ICD-10-CM | POA: Insufficient documentation

## 2014-05-18 DIAGNOSIS — E785 Hyperlipidemia, unspecified: Secondary | ICD-10-CM | POA: Insufficient documentation

## 2014-05-18 DIAGNOSIS — E119 Type 2 diabetes mellitus without complications: Secondary | ICD-10-CM | POA: Insufficient documentation

## 2014-05-18 DIAGNOSIS — I252 Old myocardial infarction: Secondary | ICD-10-CM | POA: Insufficient documentation

## 2014-05-18 DIAGNOSIS — I739 Peripheral vascular disease, unspecified: Secondary | ICD-10-CM | POA: Insufficient documentation

## 2014-05-18 DIAGNOSIS — I120 Hypertensive chronic kidney disease with stage 5 chronic kidney disease or end stage renal disease: Secondary | ICD-10-CM | POA: Insufficient documentation

## 2014-05-18 DIAGNOSIS — I2510 Atherosclerotic heart disease of native coronary artery without angina pectoris: Secondary | ICD-10-CM | POA: Insufficient documentation

## 2014-05-18 DIAGNOSIS — I774 Celiac artery compression syndrome: Secondary | ICD-10-CM | POA: Insufficient documentation

## 2014-05-18 DIAGNOSIS — T82868A Thrombosis of vascular prosthetic devices, implants and grafts, initial encounter: Secondary | ICD-10-CM

## 2014-05-18 HISTORY — DX: Atherosclerotic heart disease of native coronary artery without angina pectoris: I25.10

## 2014-05-18 HISTORY — DX: Gastro-esophageal reflux disease without esophagitis: K21.9

## 2014-05-18 HISTORY — PX: PERIPHERAL VASCULAR CATHETERIZATION: SHX172C

## 2014-05-18 LAB — GLUCOSE, CAPILLARY: Glucose-Capillary: 96 mg/dL (ref 70–99)

## 2014-05-18 LAB — POTASSIUM: Potassium: 4.2 mmol/L (ref 3.5–5.1)

## 2014-05-18 SURGERY — A/V SHUNTOGRAM/FISTULAGRAM
Anesthesia: Moderate Sedation

## 2014-05-18 MED ORDER — SODIUM CHLORIDE 0.9 % IV SOLN
INTRAVENOUS | Status: DC
  Administered 2014-05-18: 15:00:00 via INTRAVENOUS

## 2014-05-18 MED ORDER — FENTANYL CITRATE (PF) 100 MCG/2ML IJ SOLN
INTRAMUSCULAR | Status: DC | PRN
  Administered 2014-05-18: 50 ug via INTRAVENOUS

## 2014-05-18 MED ORDER — HEPARIN SODIUM (PORCINE) 1000 UNIT/ML IJ SOLN
INTRAMUSCULAR | Status: DC | PRN
  Administered 2014-05-18: 3000 [IU] via INTRAVENOUS

## 2014-05-18 MED ORDER — MIDAZOLAM HCL 2 MG/2ML IJ SOLN
INTRAMUSCULAR | Status: DC | PRN
  Administered 2014-05-18: 2 mg via INTRAVENOUS

## 2014-05-18 MED ORDER — IOHEXOL 300 MG/ML  SOLN
INTRAMUSCULAR | Status: DC | PRN
  Administered 2014-05-18: 50 mL via INTRAVENOUS

## 2014-05-18 MED ORDER — FENTANYL CITRATE (PF) 100 MCG/2ML IJ SOLN
INTRAMUSCULAR | Status: AC
  Filled 2014-05-18: qty 2

## 2014-05-18 MED ORDER — CEFAZOLIN SODIUM 1-5 GM-% IV SOLN: 1.0000 g | Freq: Once | INTRAVENOUS | Status: DC

## 2014-05-18 MED ORDER — MIDAZOLAM HCL 5 MG/5ML IJ SOLN
INTRAMUSCULAR | Status: AC
  Filled 2014-05-18: qty 5

## 2014-05-18 MED ORDER — HEPARIN SODIUM (PORCINE) 1000 UNIT/ML IJ SOLN
INTRAMUSCULAR | Status: AC
  Filled 2014-05-18: qty 1

## 2014-05-18 SURGICAL SUPPLY — 11 items
BALLN LUTONIX DCB 6X80X130 (BALLOONS) ×4
CANNULA 5F STIFF (CANNULA) ×4
DEVICE PRESTO INFLATION (MISCELLANEOUS) ×4
DRAPE BRACHIAL (DRAPES) ×4
KIT 5FR STIFF NT/TG (MISCELLANEOUS) ×4
PACK ANGIOGRAPHY (CUSTOM PROCEDURE TRAY) ×4
SHEATH BRITE TIP 6FRX5.5 (SHEATH) ×4
STENT VIABAHN 6X100X120 (Permanent Stent) ×4 IMPLANT
TOWEL OR 17X26 4PK STRL BLUE (TOWEL DISPOSABLE) ×4
WIRE G 018X200 V18 (WIRE) ×4
WIRE MAGIC TOR.035 180C (WIRE) ×4

## 2014-05-18 NOTE — H&P (Signed)
[facility] History & Physical Update  The patient was interviewed and re-examined.  The patient's previous History and Physical has been reviewed and is unchanged.  There is no change in the plan of care.  DEW,JASON, MD  05/18/2014, 2:41 PM

## 2014-05-18 NOTE — Op Note (Signed)
[facility]    OPERATIVE NOTE   PROCEDURE: 1.  Left brachiocephalic arteriovenous fistula cannulation under ultrasound guidance 2.  left arm fistulagram 3.  PTA of mid upper arm cephalic vein stenosis with 6 mm diameter Lutonix drug coated angioplasty balloon 4.  Covered stent for stenosis and extravasation of mid upper arm cephalic vein after PTA  PRE-OPERATIVE DIAGNOSIS: 1. ESRD 2. Poorly functioning left arm AVF with inability to cannulate and extravasation  POST-OPERATIVE DIAGNOSIS: same as above   SURGEON: Leotis Pain, MD  ANESTHESIA: local with MCS  ESTIMATED BLOOD LOSS: minimal  FINDING(S): 1. 70-80% stenosis of mid upper arm cephalic vein  SPECIMEN(S):  None  CONTRAST: 30 cc  INDICATIONS: John Robinson is a 81 y.o. male who presents with malfunctioning left brachiocephalic arteriovenous fistula.  The patient is scheduled for left arm fistulagram.  The patient is aware the risks include but are not limited to: bleeding, infection, thrombosis of the cannulated access, and possible anaphylactic reaction to the contrast.  The patient is aware of the risks of the procedure and elects to proceed forward.  DESCRIPTION: After full informed written consent was obtained, the patient was brought back to the angiography suite and placed supine upon the angiography table.  The patient was connected to monitoring equipment.  The left arm was prepped and draped in the standard fashion for a percutaneous access intervention.  Under ultrasound guidance, the left brachiocephalic arteriovenous fistula was cannulated with a micropuncture needle near the anastomosis in an antegrade fashion under direct ultrasound guidance and a permanent image was performed.  The microwire was advanced into the fistula and the needle was exchanged for the a microsheath.  I then upsized to a 6 Fr Sheath and imaging was performed.  Hand injections were completed to image the access  including the central venous system. This demonstrated no central venous stenosis issues.  Based on the images, this patient will need: treatment of the mid arm cephalic vein stenosis of about 70-80%. I then gave the patient 3000 units of intravenous heparin.  I then crossed the stenosis with a Magic Tourqe wire.  Based on the imaging, a 6 mm x 80 mm  Lutonix drug coated  angioplasty balloon was selected.  The balloon was centered around the 70-80% mid upper arm cephalic vein stenosis and inflated to 10 ATM for 1 minute(s).  While the balloon was inflated the arterial anastomosis was evaluated and only mild perianastomotic stenosis was seen.  On completion imaging, a 60% residual stenosis as well as extravasation was present.  I then exchanged for an 0.018 wire and placed a 6 mm diameter by 10 cm length Viabahn covered stent with an excellent angiographic completion result and <10% residual stenosis.   Based on the completion imaging, no further intervention is necessary.  The wire and balloon were removed from the sheath.  A 4-0 Monocryl purse-string suture was sewn around the sheath.  The sheath was removed while tying down the suture.  A sterile bandage was applied to the puncture site.  COMPLICATIONS: none  CONDITION: stable   John Robinson  05/18/2014 5:17 PM

## 2014-05-18 NOTE — Procedures (Signed)
Note already done

## 2014-05-18 NOTE — Discharge Instructions (Signed)
Vascular Access for Hemodialysis A vascular access is a connection between two blood vessels that allows blood to be easily removed from the body and returned to the body during hemodialysis. Hemodialysis is a procedure in which a machine outside of the body filters the blood. There are three types of vascular accesses:   Arteriovenous fistula. This is a connection between an artery and a vein (usually in the arm) that is made by sewing them together. Blood in the artery flows directly into the vein, causing it to get larger over time. This makes it easier for the vein to be used for hemodialysis. An arteriovenous fistula takes 1-6 months to develop after surgery.   Arteriovenous graft. This is a connection between an artery and a vein in the arm that is made with a tube. An arteriovenous graft can be used within 2-3 weeks of surgery.   Venous catheter. This is a thin, flexible tube that is placed in a large vein (usually in the neck, chest, or groin). A venous catheter for hemodialysis contains two tubes that come out of the skin. A venous catheter can be used right away. It is usually used as a temporary access if you need hemodialysis before a fistula or graft has developed. It may also be used as a permanent access if a fistula or graft cannot be created. WHICH TYPE OF ACCESS IS BEST FOR ME? The type of access that is best for you depends on the size and strength of your veins.  A fistula is usually the preferred type of access. It can last several years and is less likely than the other types of accesses to become infected or to cause blood clots within a blood vessel (thrombosis). However, a fistula is not an option for everyone. If your veins are not the right size, a graft may be used instead. Grafts require you to have strong veins. If your veins are not strong enough for a graft, a catheter may be used. Catheters are more likely than fistulas and grafts to become infected or to have thrombosis.   Sometimes, only one type of access is an option. Your health care provider will help you determine which type of access is best for you.  HOW IS A VASCULAR ACCESS USED? The way the access is used depends on the type of access:   If the access is a fistula or graft, two needles are inserted through the skin into the access before each hemodialysis session. Blood leaves the body through one of the needles and travels through a tube to the hemodialysis machine (dialyzer). It then flows through another tube and returns to the body through the second needle.   If the access is a catheter, one tube is connected directly to the tube that leads to the dialyzer and the other is connected to a tube that leads away from the dialyzer. Blood leaves the body through one tube and returns to the body through the other.  WHAT KIND OF PROBLEMS CAN OCCUR WITH VASCULAR ACCESSES?  Blood clots within a blood vessel (thrombosis). Thrombosis can lead to a narrowing of a blood vessel or tube (stenosis). If thrombosis occurs frequently, another access site may be created as a backup.   Infection.  These problems are most likely to occur with a venous catheter and least likely to occur with an arteriovenous fistula.  HOW DO I CARE FOR MY VASCULAR ACCESS? Wear a medical alert bracelet. This tells health care providers that you are   a dialysis patient in the case of an emergency and allows them to care for your veins appropriately. If you have a graft or fistula:   A "bruit" is a noise that is heard with a stethoscope and a "thrill" is a vibration felt over the graft or fistula. The presence of the bruit and thrill indicates that the access is working. You will be taught to feel for the thrill each day. If this is not felt, the access may be clotted. Call your health care provider.   You may use the arm where your vascular access is located freely after the site heals. Keep the following in mind:   Avoid pressure on  the arm.   Avoid lifting heavy objects with the arm.   Avoid sleeping on the arm.   Avoid wearing tight-sleeved shirts or jewelry around the graft or fistula.   Do not allow blood pressure monitoring or needle punctures on the side where the graft or fistula is located.   With permission from your health care provider, you may do exercises to help with blood flow through a fistula. These exercises involve squeezing a rubber ball or other soft objects as instructed. SEEK MEDICAL CARE IF:   Chills develop.   You have an oral temperature above 102 F (38.9 C).  Swelling around the graft or fistula gets worse.   New pain develops.   Pus or other fluid (drainage) is seen at the vascular access site.   Skin redness or red streaking is seen on the skin around, above, or below the vascular access. SEEK IMMEDIATE MEDICAL CARE IF:   Pain, numbness, or an unusual pale skin color develops in the hand on the side of your fistula.   Dizziness or weakness develops that you have not had before.   The vascular access has bleeding that cannot be easily controlled. Document Released: 03/23/2002 Document Revised: 05/17/2013 Document Reviewed: 05/19/2012 [employer] Patient Information 2015 [employer]. This information is not intended to replace advice given to you by your health care provider. Make sure you discuss any questions you have with your health care provider.  

## 2014-05-18 NOTE — Progress Notes (Signed)
Pt bp somewhat elevated in post procedure, however has not taken bp meds today, thus checking with Dr Lucky Cowboy, to take meds after he gets home this afternoon, and may go home.

## 2014-05-18 NOTE — Progress Notes (Signed)
Dr schnier informed pt took brilinta yesterday

## 2014-05-24 ENCOUNTER — Encounter: Payer: Self-pay | Admitting: Vascular Surgery

## 2014-06-07 ENCOUNTER — Encounter: Payer: Self-pay | Admitting: *Deleted

## 2014-06-08 ENCOUNTER — Ambulatory Visit
Admission: RE | Admit: 2014-06-08 | Discharge: 2014-06-08 | Disposition: A | Discharge status: Home / Self Care | Payer: Commercial Managed Care - HMO | Source: Ambulatory Visit | Attending: Vascular Surgery | Admitting: Vascular Surgery

## 2014-06-08 ENCOUNTER — Encounter: Payer: Self-pay | Admitting: *Deleted

## 2014-06-08 ENCOUNTER — Encounter
Admission: RE | Disposition: A | Discharge status: Home / Self Care | Payer: Self-pay | Source: Ambulatory Visit | Attending: Vascular Surgery

## 2014-06-08 DIAGNOSIS — L309 Dermatitis, unspecified: Secondary | ICD-10-CM | POA: Insufficient documentation

## 2014-06-08 DIAGNOSIS — Z794 Long term (current) use of insulin: Secondary | ICD-10-CM | POA: Insufficient documentation

## 2014-06-08 DIAGNOSIS — I120 Hypertensive chronic kidney disease with stage 5 chronic kidney disease or end stage renal disease: Secondary | ICD-10-CM | POA: Diagnosis not present

## 2014-06-08 DIAGNOSIS — N186 End stage renal disease: Secondary | ICD-10-CM | POA: Insufficient documentation

## 2014-06-08 DIAGNOSIS — N2889 Other specified disorders of kidney and ureter: Secondary | ICD-10-CM | POA: Insufficient documentation

## 2014-06-08 DIAGNOSIS — I739 Peripheral vascular disease, unspecified: Secondary | ICD-10-CM | POA: Insufficient documentation

## 2014-06-08 DIAGNOSIS — E785 Hyperlipidemia, unspecified: Secondary | ICD-10-CM | POA: Insufficient documentation

## 2014-06-08 DIAGNOSIS — Z452 Encounter for adjustment and management of vascular access device: Secondary | ICD-10-CM | POA: Insufficient documentation

## 2014-06-08 DIAGNOSIS — Z992 Dependence on renal dialysis: Secondary | ICD-10-CM | POA: Diagnosis not present

## 2014-06-08 DIAGNOSIS — I701 Atherosclerosis of renal artery: Secondary | ICD-10-CM | POA: Diagnosis not present

## 2014-06-08 DIAGNOSIS — I70219 Atherosclerosis of native arteries of extremities with intermittent claudication, unspecified extremity: Secondary | ICD-10-CM | POA: Diagnosis not present

## 2014-06-08 DIAGNOSIS — I774 Celiac artery compression syndrome: Secondary | ICD-10-CM | POA: Diagnosis not present

## 2014-06-08 DIAGNOSIS — F17200 Nicotine dependence, unspecified, uncomplicated: Secondary | ICD-10-CM | POA: Diagnosis not present

## 2014-06-08 DIAGNOSIS — I252 Old myocardial infarction: Secondary | ICD-10-CM | POA: Insufficient documentation

## 2014-06-08 DIAGNOSIS — M5412 Radiculopathy, cervical region: Secondary | ICD-10-CM | POA: Insufficient documentation

## 2014-06-08 DIAGNOSIS — Z720 Tobacco use: Secondary | ICD-10-CM | POA: Insufficient documentation

## 2014-06-08 DIAGNOSIS — Z7982 Long term (current) use of aspirin: Secondary | ICD-10-CM | POA: Diagnosis not present

## 2014-06-08 DIAGNOSIS — K219 Gastro-esophageal reflux disease without esophagitis: Secondary | ICD-10-CM | POA: Insufficient documentation

## 2014-06-08 DIAGNOSIS — Z85820 Personal history of malignant melanoma of skin: Secondary | ICD-10-CM | POA: Insufficient documentation

## 2014-06-08 DIAGNOSIS — R42 Dizziness and giddiness: Secondary | ICD-10-CM | POA: Insufficient documentation

## 2014-06-08 DIAGNOSIS — D489 Neoplasm of uncertain behavior, unspecified: Secondary | ICD-10-CM | POA: Insufficient documentation

## 2014-06-08 DIAGNOSIS — M9979 Connective tissue and disc stenosis of intervertebral foramina of abdomen and other regions: Secondary | ICD-10-CM | POA: Insufficient documentation

## 2014-06-08 DIAGNOSIS — I10 Essential (primary) hypertension: Secondary | ICD-10-CM | POA: Insufficient documentation

## 2014-06-08 DIAGNOSIS — N184 Chronic kidney disease, stage 4 (severe): Secondary | ICD-10-CM | POA: Insufficient documentation

## 2014-06-08 DIAGNOSIS — I714 Abdominal aortic aneurysm, without rupture: Secondary | ICD-10-CM | POA: Insufficient documentation

## 2014-06-08 DIAGNOSIS — Z79899 Other long term (current) drug therapy: Secondary | ICD-10-CM | POA: Diagnosis not present

## 2014-06-08 DIAGNOSIS — M5090 Cervical disc disorder, unspecified, unspecified cervical region: Secondary | ICD-10-CM | POA: Insufficient documentation

## 2014-06-08 DIAGNOSIS — R609 Edema, unspecified: Secondary | ICD-10-CM | POA: Insufficient documentation

## 2014-06-08 DIAGNOSIS — J309 Allergic rhinitis, unspecified: Secondary | ICD-10-CM | POA: Insufficient documentation

## 2014-06-08 DIAGNOSIS — S0083XA Contusion of other part of head, initial encounter: Secondary | ICD-10-CM | POA: Insufficient documentation

## 2014-06-08 DIAGNOSIS — E119 Type 2 diabetes mellitus without complications: Secondary | ICD-10-CM | POA: Diagnosis not present

## 2014-06-08 DIAGNOSIS — M436 Torticollis: Secondary | ICD-10-CM | POA: Insufficient documentation

## 2014-06-08 DIAGNOSIS — J410 Simple chronic bronchitis: Secondary | ICD-10-CM | POA: Insufficient documentation

## 2014-06-08 DIAGNOSIS — Z9889 Other specified postprocedural states: Secondary | ICD-10-CM | POA: Insufficient documentation

## 2014-06-08 HISTORY — PX: PERIPHERAL VASCULAR CATHETERIZATION: SHX172C

## 2014-06-08 LAB — GLUCOSE, CAPILLARY: Glucose-Capillary: 126 mg/dL — ABNORMAL HIGH (ref 65–99)

## 2014-06-08 SURGERY — DIALYSIS/PERMA CATHETER REMOVAL
Anesthesia: Moderate Sedation

## 2014-06-08 MED ORDER — LIDOCAINE-EPINEPHRINE 1 %-1:100000 IJ SOLN
INTRAMUSCULAR | Status: DC | PRN
  Administered 2014-06-08: 10 mL via INTRADERMAL

## 2014-06-08 SURGICAL SUPPLY — 3 items
KIT REMOVAL (MISCELLANEOUS) ×2 IMPLANT
PACK ANGIOGRAPHY (CUSTOM PROCEDURE TRAY) ×2 IMPLANT
TOWEL OR 17X26 4PK STRL BLUE (TOWEL DISPOSABLE) ×2 IMPLANT

## 2014-06-08 NOTE — Discharge Instructions (Signed)

## 2014-06-08 NOTE — Op Note (Signed)
        OPERATIVE NOTE   Preoperative diagnosis:   1. ESRD with functional permanent access  Postoperative diagnosis:  1. ESRD with functional permanent access  Procedure:  Removal of right jugular Permcath  Surgeon:  Leotis Pain, MD  Anesthesia:  Local  EBL:  Minimal  Indication for the Procedure:  The patient has a functional permanent dialysis access and no longer needs their permcath.  This can be removed.  Risks and benefits are discussed and informed consent is obtained.  Description of the Procedure:  The patient's right neck, chest and existing catheter were sterilely prepped and draped. The area around the catheter was anesthetized copiously with 1% lidocaine. The catheter was dissected out with curved hemostats until the cuff was freed from the surrounding fibrous sheath. The fiber sheath was transected, and the catheter was then removed in its entirety using gentle traction. Pressure was held and sterile dressings were placed. The patient tolerated the procedure well and was taken to the recovery room in stable condition.     [name]  06/08/2014, 1:56 PM

## 2014-06-08 NOTE — H&P (Signed)
[facility] History & Physical Update  The patient was interviewed and re-examined.  The patient's previous History and Physical has been reviewed and is unchanged.  There is no change in the plan of care.  DEW,JASON, MD  06/08/2014, 1:03 PM

## 2014-06-10 ENCOUNTER — Encounter: Payer: Self-pay | Admitting: Vascular Surgery

## 2014-06-27 ENCOUNTER — Ambulatory Visit: Payer: Self-pay | Admitting: Family Medicine

## 2014-06-28 ENCOUNTER — Ambulatory Visit (INDEPENDENT_AMBULATORY_CARE_PROVIDER_SITE_OTHER): Payer: Commercial Managed Care - HMO | Admitting: Family Medicine

## 2014-06-28 ENCOUNTER — Encounter: Payer: Self-pay | Admitting: Family Medicine

## 2014-06-28 VITALS — BP 120/72 | HR 81 | Temp 98.3°F | Resp 16 | Wt 165.0 lb

## 2014-06-28 DIAGNOSIS — E1121 Type 2 diabetes mellitus with diabetic nephropathy: Secondary | ICD-10-CM

## 2014-06-28 DIAGNOSIS — N186 End stage renal disease: Secondary | ICD-10-CM | POA: Diagnosis not present

## 2014-06-28 DIAGNOSIS — E119 Type 2 diabetes mellitus without complications: Secondary | ICD-10-CM | POA: Diagnosis not present

## 2014-06-28 DIAGNOSIS — Z992 Dependence on renal dialysis: Secondary | ICD-10-CM | POA: Diagnosis not present

## 2014-06-28 DIAGNOSIS — I150 Renovascular hypertension: Secondary | ICD-10-CM

## 2014-06-28 DIAGNOSIS — D692 Other nonthrombocytopenic purpura: Secondary | ICD-10-CM

## 2014-06-28 LAB — POCT GLYCOSYLATED HEMOGLOBIN (HGB A1C): Hemoglobin A1C: 5.9

## 2014-06-28 MED ORDER — LISINOPRIL 10 MG PO TABS: 10.0000 mg | ORAL_TABLET | Freq: Every day | ORAL | Status: DC

## 2014-06-28 MED ORDER — CARVEDILOL 25 MG PO TABS: 50.0000 mg | ORAL_TABLET | Freq: Two times a day (BID) | ORAL | Status: DC

## 2014-06-28 NOTE — Progress Notes (Signed)
Patient: John Robinson Male    DOB: 06-25-1932   81 y.o.   MRN: [id number] Visit Date: 06/28/2014  Today's Provider: Lelon Huh, MD   Chief Complaint  Patient presents with  . Diabetes    follow up  . Hypertension    follow up   Subjective:    Hypertension This is a chronic problem. The current episode started more than 1 year ago. The problem is unchanged. Associated symptoms include malaise/fatigue, palpitations and shortness of breath. Pertinent negatives include no anxiety, chest pain or headaches. Compliance problems include exercise.      Hypertension, follow-up:  BP Readings from Last 3 Encounters:  06/28/14 120/72  06/08/14 130/72  05/16/14 100/54    He was last seen for hypertension 1 months ago.  He was seen at [facility] cardiology in may and had carvedilol increase to 5mg  tiwce a day and lisinopril increased to 10mg  a day which he states he has tolerated fairly well. He needs 90 day rx of both of these medications.  BP at that visit was 100/54. Management changes since that visit include none. He reports good compliance with treatment. He is not having side effects. none  He is exercising. He is adherent to low salt diet.   Outside blood pressures are very labile, but mostly 130s/90s since increase in carvedilol and lisinopril.  ------------------------------------------------------------------------    Diabetes Mellitus Type II, Follow-up:   Last HgbA1C  03/18/2014  6.3 Last seen for diabetes 1 months ago.  Management changes included none. Patient was advised to stay off Glipizide for the time being.  He reports good compliance with treatment. He is not having side effects. none . Home blood sugar records: fasting range: 100-120  Episodes of hypoglycemia? no   Most Recent Eye Exam: within the last year Weight trend: stable Current diet: in general, a "healthy" diet   Current exercise: none  Pertinent Labs:    Component Value Date/Time    CREATININE 7.13* 04/10/2014 2301   CREATININE 5.66 03/18/2014    Wt Readings from Last 3 Encounters:  06/28/14 165 lb (74.844 kg)  06/08/14 170 lb (77.111 kg)  05/16/14 167 lb (75.751 kg)    ------------------------------------------------------------------------   Coronary artery disease, follow up   He reports good compliance with treatment. He is not having side effects.  He is experiencing chest tightness and shortness of breath.  His last vist with his cardiologist was in May  with [facility].   ------------------------------------------------------------------------   End stage kidney failure. He continues on dialysis which he seems to be doing well with, and has had a little more energy lastely.   Past Medical History  Diagnosis Date  . Diabetes   . Heart problem   . HBP (high blood pressure)   . High cholesterol   . Coronary artery disease   . Myocardial infarction   . Anginal pain   . CHF (congestive heart failure)   . GERD (gastroesophageal reflux disease)   . Chicken pox   . Measles   . Mumps   . History of MI (myocardial infarction)   . Renal artery stenosis   . Peripheral arterial disease   . Allergic rhinitis   . History of malignant melanoma   . Eczema   . History of recurrent UTIs     Previous Medications   ASPIRIN 81 MG TABLET    Take 81 mg by mouth daily.   ATORVASTATIN (LIPITOR) 80 MG TABLET    Take 80 mg by  mouth daily.   CLONIDINE (CATAPRES) 0.1 MG TABLET    Take 1 tablet by mouth 3 (three) times daily as needed.   CLOTRIMAZOLE-BETAMETHASONE (LOTRISONE) CREAM    Apply 1 application topically 2 (two) times daily.   COLESEVELAM (WELCHOL) 625 MG TABLET    Take 1,250 mg by mouth 2 (two) times daily with a meal.    EPOETIN ALFA (EPOGEN,PROCRIT) 2000 UNIT/ML INJECTION    2,000 Units 3 (three) times a week.   GLUCOSE BLOOD TEST STRIP    1 strip by Other route daily.   INSULIN GLARGINE (LANTUS) 100 UNIT/ML INJECTION    Inject 8 Units into  the skin at bedtime.   ISOSORBIDE MONONITRATE (IMDUR) 60 MG 24 HR TABLET    Take 60 mg by mouth daily.   MULTIPLE VITAMINS-MINERALS (MULTIVITAMIN WITH MINERALS) TABLET    Take 1 tablet by mouth daily.   NITROGLYCERIN (NITROSTAT) 0.4 MG SL TABLET    Place 1 tablet under the tongue. 1 tablet Sublingual Every Five Mintues As Needed For Chest Pain   OMEPRAZOLE (PRILOSEC) 20 MG CAPSULE    Take 20 mg by mouth daily.   SODIUM BICARBONATE 650 MG TABLET    Take 1 tablet by mouth 2 (two) times daily.   TICAGRELOR (BRILINTA) 90 MG TABS TABLET    Take by mouth 2 (two) times daily.   VITAMIN D, ERGOCALCIFEROL, (DRISDOL) 50000 UNITS CAPS CAPSULE    Take 1 capsule by mouth once a week.       Review of Systems  Constitutional: Positive for malaise/fatigue and fatigue. Negative for fever and chills.  Respiratory: Positive for shortness of breath. Negative for wheezing.   Cardiovascular: Positive for palpitations. Negative for chest pain and leg swelling.  Neurological: Positive for dizziness and light-headedness. Negative for headaches.    History  Substance Use Topics  . Smoking status: Former Smoker -- 0.25 packs/day    Types: Cigarettes    Quit date: 05/17/2012  . Smokeless tobacco: Never Used  . Alcohol Use: Not on file   Objective:   BP 120/72 mmHg  Pulse 81  Temp(Src) 98.3 F (36.8 C) (Oral)  Resp 16  Wt 165 lb (74.844 kg)  SpO2 98%  Physical Exam  General Appearance:    Alert, cooperative, no distress  Eyes:    PERRL, conjunctiva/corneas clear, EOM's intact       Lungs:     Clear to auscultation bilaterally, respirations unlabored  Heart:    Regular rate and rhythm  Neurologic:   Awake, alert, oriented x 3. No apparent focal neurological           defect.   Derm Extensive senile purpura both upper extremities.     Results for orders placed or performed in visit on 06/28/14  POCT glycosylated hemoglobin (Hb A1C)  Result Value Ref Range   Hemoglobin A1C 5.9        Assessment  & Plan:      1. Type 2 diabetes mellitus without complication Well controlled.. Continue current treatment regiment - POCT glycosylated hemoglobin (Hb A1C)  2. Senile purpura Stable  3. ESRD on dialysis Continue current plan of care and nephrology follow up.   4. Type 2 diabetes mellitus with diabetic nephropathy  5. Hypertension BP remains labile, but much better with increase in betablocker and lisinopril at most recent cardiology visit.   6. CAD- Symptomatically controlled. He reports he recently had stress test at [facility] and has follow up next week.   Follow up: Return  in about 3 months (around 09/28/2014).

## 2014-07-22 ENCOUNTER — Other Ambulatory Visit
Admission: RE | Admit: 2014-07-22 | Discharge: 2014-07-22 | Disposition: A | Discharge status: Home / Self Care | Payer: Commercial Managed Care - HMO | Source: Other Acute Inpatient Hospital | Attending: Internal Medicine | Admitting: Internal Medicine

## 2014-07-22 DIAGNOSIS — N186 End stage renal disease: Secondary | ICD-10-CM | POA: Diagnosis present

## 2014-07-22 DIAGNOSIS — E875 Hyperkalemia: Secondary | ICD-10-CM | POA: Diagnosis present

## 2014-07-22 LAB — POTASSIUM: Potassium: 5.1 mmol/L (ref 3.5–5.1)

## 2014-07-25 NOTE — Patient Outreach (Signed)
[facility]) Care Management  07/25/2014  John Robinson [birth year] [id number]   Referral from [facility] Tier 4 list, assigned to Maury Dus, RN for patient outreach.  Jeronda Don L. Elbert Ewings [facility] Care Management Assistant [phone]

## 2014-08-11 ENCOUNTER — Ambulatory Visit: Payer: Commercial Managed Care - HMO | Admitting: Family Medicine

## 2014-08-11 ENCOUNTER — Other Ambulatory Visit: Payer: Self-pay

## 2014-08-11 ENCOUNTER — Ambulatory Visit (INDEPENDENT_AMBULATORY_CARE_PROVIDER_SITE_OTHER): Payer: Commercial Managed Care - HMO | Admitting: Family Medicine

## 2014-08-11 ENCOUNTER — Encounter: Payer: Self-pay | Admitting: Family Medicine

## 2014-08-11 VITALS — BP 98/62 | HR 84 | Resp 18 | Wt 166.6 lb

## 2014-08-11 DIAGNOSIS — R0982 Postnasal drip: Secondary | ICD-10-CM | POA: Diagnosis not present

## 2014-08-11 MED ORDER — LORATADINE 10 MG PO TABS: 10.0000 mg | ORAL_TABLET | Freq: Every day | ORAL | Status: DC

## 2014-08-11 NOTE — Progress Notes (Signed)
Patient ID: John Robinson, male   DOB: [birth year], 81 y.o.   MRN: [id number]   Patient: John Robinson Male    DOB: 1933-01-01   82 y.o.   MRN: [id number] Visit Date: 08/11/2014  Today's Provider: Vernie Murders, PA   Chief Complaint  Patient presents with  . Cough    X 10 days  . Nasal Congestion    X 10 days   Subjective:    HPI This 81 year old male on dialysis three times a week since March 2016 for renal failure developed cough and congestion 10 days ago. Denies fever but has some wheezing sounds heard frequently. Occasionally cough without sputum production and wheeze will clear. No shortness of breath or fevers. Feels postnasal drip and rhinorrhea.   Previous Medications   ASPIRIN 81 MG TABLET    Take 81 mg by mouth daily.   ATORVASTATIN (LIPITOR) 80 MG TABLET    Take 80 mg by mouth daily.   CARVEDILOL (COREG) 25 MG TABLET    Take 2 tablets (50 mg total) by mouth 2 (two) times daily.   CLONIDINE (CATAPRES) 0.1 MG TABLET    Take 1 tablet by mouth 3 (three) times daily as needed.   CLOTRIMAZOLE-BETAMETHASONE (LOTRISONE) CREAM    Apply 1 application topically 2 (two) times daily.   COLESEVELAM (WELCHOL) 625 MG TABLET    Take 1,250 mg by mouth 2 (two) times daily with a meal.    EPOETIN ALFA (EPOGEN,PROCRIT) 2000 UNIT/ML INJECTION    2,000 Units 3 (three) times a week.   GLUCOSE BLOOD TEST STRIP    1 strip by Other route daily.   INSULIN GLARGINE (LANTUS) 100 UNIT/ML INJECTION    Inject 8 Units into the skin at bedtime.   ISOSORBIDE MONONITRATE (IMDUR) 60 MG 24 HR TABLET    Take 60 mg by mouth daily.   LISINOPRIL (PRINIVIL,ZESTRIL) 10 MG TABLET    Take 1 tablet (10 mg total) by mouth daily.   MULTIPLE VITAMINS-MINERALS (MULTIVITAMIN WITH MINERALS) TABLET    Take 1 tablet by mouth daily.   NITROGLYCERIN (NITROSTAT) 0.4 MG SL TABLET    Place 1 tablet under the tongue. 1 tablet Sublingual Every Five Mintues As Needed For Chest Pain   OMEPRAZOLE (PRILOSEC) 20 MG CAPSULE    Take  20 mg by mouth daily.   SODIUM BICARBONATE 650 MG TABLET    Take 1 tablet by mouth 2 (two) times daily.   TICAGRELOR (BRILINTA) 90 MG TABS TABLET    Take by mouth 2 (two) times daily.   VITAMIN D, ERGOCALCIFEROL, (DRISDOL) 50000 UNITS CAPS CAPSULE    Take 1 capsule by mouth once a week.   Past Medical History  Diagnosis Date  . Diabetes   . Heart problem   . HBP (high blood pressure)   . High cholesterol   . Coronary artery disease   . Myocardial infarction   . Anginal pain   . CHF (congestive heart failure)   . GERD (gastroesophageal reflux disease)   . Chicken pox   . Measles   . Mumps   . History of MI (myocardial infarction)   . Renal artery stenosis   . Peripheral arterial disease   . Allergic rhinitis   . History of malignant melanoma   . Eczema   . History of recurrent UTIs    Past Surgical History  Procedure Laterality Date  . Rca stent s/p mi  12/05/2000  . Arm surgery Left   . Hip  surgery Left   . Appendectomy  1957  . Coronary angioplasty Right 12/22/1996    Right Iliac with stent  . Peripheral vascular catheterization N/A 05/18/2014    Procedure: A/V Shuntogram/Fistulagram;  Surgeon: Algernon Huxley, MD;  Location: [facility];  Service: Cardiovascular;  Laterality: N/A;  . Tympanoplasty Left 11/2008    Left ear/Dr. Tami Ribas  . Aorta-iliac-femoral bypass  02/2012    Aorta-left iliac bypass with iliac right femoral bypass. Repair pseudoanuerysm. Done at [facility]  . Aorta-iliac-femoral bypass  1987  . Melanoma excision  05/25/2002    Chest wall  . Lad and rca stent  02/09/1999  . Peripheral vascular catheterization N/A 06/08/2014    Procedure: Dialysis/Perma Catheter Removal;  Surgeon: Algernon Huxley, MD;  Location: [place] [facility];  Service: Cardiovascular;  Laterality: N/A;   Family History  Problem Relation Age of Onset  . Cancer Mother     Lymphatic cancer  . Lung cancer Father   . Diabetes Brother     type 2  . Heart attack Brother    Review of  Systems  Constitutional: Negative.   HENT: Positive for congestion, postnasal drip and rhinorrhea.   Respiratory: Positive for cough and wheezing. Negative for shortness of breath.   Cardiovascular: Negative.   Genitourinary: Negative.    History  Substance Use Topics  . Smoking status: Former Smoker -- 0.25 packs/day    Types: Cigarettes    Quit date: 05/17/2012  . Smokeless tobacco: Never Used  . Alcohol Use: Not on file   Objective:   BP 98/62 mmHg  Pulse 84  Resp 18  Wt 166 lb 9.6 oz (75.569 kg)  SpO2 100%  Physical Exam  Constitutional: He is oriented to person, place, and time. He appears well-developed and well-nourished.  HENT:  Head: Normocephalic and atraumatic.  Right Ear: External ear normal.  Left Ear: External ear normal.  Nose: Nose normal.  Mouth/Throat: Oropharynx is clear and moist.  Eyes: EOM are normal. Pupils are equal, round, and reactive to light.  Neck: Normal range of motion. Neck supple.  Cardiovascular: Normal rate and regular rhythm.   Pulmonary/Chest: Effort normal and breath sounds normal.  Abdominal: Soft. Bowel sounds are normal.  Neurological: He is alert and oriented to person, place, and time.  Psychiatric: He has a normal mood and affect. His behavior is normal.      Assessment & Plan:    1. Post-nasal drip Recent rhinorrhea, and post nasal drip giving him a ticklish cough occasionally. May use Claritin. Concerned about any medications with his degree of renal failure. Continue follow up with nephrologist and 3 times a week dialysis. Recheck if any worsening of symptoms or signs of infection (purulent sputum or fever). - loratadine (CLARITIN) 10 MG tablet; Take 1 tablet (10 mg total) by mouth daily.  Dispense: 30 tablet; Refill: 11

## 2014-08-11 NOTE — Patient Outreach (Signed)
[facility]) Care Management  08/11/2014  John Robinson 1932-12-10 [id number]  First telephone call to patient regarding [employer] Tier 4 list.  Person answering states patient not at home.  HIPAA compliant message left.    Plan: Will attempt patient within 1-2 weeks.  Jone Baseman, RN, MSN [place] [phone]

## 2014-08-15 ENCOUNTER — Other Ambulatory Visit: Payer: Self-pay

## 2014-08-15 NOTE — Patient Outreach (Signed)
[facility] [facility]) Care Management  08/15/2014  Anddy Wingert [birth year] [id number]   Second telephone call to patient regarding [employer] referral.  No answer voice message left.  HIPAA compliant message left.    Plan: RN will make third attempt within 1-2 weeks.    Jone Baseman, RN, MSN [place] [phone]

## 2014-08-16 ENCOUNTER — Other Ambulatory Visit: Payer: Self-pay

## 2014-08-16 NOTE — Patient Outreach (Signed)
[facility]) Care Management  08/16/2014  John Robinson [birth year] [id number]   Telephone call to patient to explain and offer [place] Management services.  Spoke with wife John Robinson, who takes care of patient medical needs.  She reports patient has done better recently with starting dialysis.  Patient blood sugar and blood pressure are doing a lot better. She refused [facility] Care Management services at this time but willing to receive information via mail.  Plan:  RN Health coach will forward patient to [place] Del to close patient due to refusal of services. RN Health Coach will notify patient primary MD of refusal of services.  RN Health Coach will send patient [place] Management contact information as requested.  Jone Baseman, RN, MSN [place] [phone]

## 2014-08-22 NOTE — Patient Outreach (Signed)
[place] [facility]) Care Management  08/16/2014  Tramane Gorum 09-30-1932 [id number]   Notification from Jon Billings, RN to close case due to patient refused [place] Management services.  Thanks, Ronnell Freshwater. [place], [place] Assistant Phone: [phone] Fax: [phone]

## 2014-09-13 ENCOUNTER — Ambulatory Visit (INDEPENDENT_AMBULATORY_CARE_PROVIDER_SITE_OTHER): Payer: Commercial Managed Care - HMO | Admitting: Family Medicine

## 2014-09-13 ENCOUNTER — Encounter: Payer: Self-pay | Admitting: Family Medicine

## 2014-09-13 VITALS — BP 104/76 | HR 84 | Temp 97.7°F | Resp 18 | Wt 164.8 lb

## 2014-09-13 DIAGNOSIS — L309 Dermatitis, unspecified: Secondary | ICD-10-CM | POA: Diagnosis not present

## 2014-09-13 DIAGNOSIS — N186 End stage renal disease: Secondary | ICD-10-CM | POA: Diagnosis not present

## 2014-09-13 DIAGNOSIS — Z992 Dependence on renal dialysis: Secondary | ICD-10-CM

## 2014-09-13 DIAGNOSIS — J309 Allergic rhinitis, unspecified: Secondary | ICD-10-CM

## 2014-09-13 DIAGNOSIS — I252 Old myocardial infarction: Secondary | ICD-10-CM | POA: Diagnosis not present

## 2014-09-13 MED ORDER — CLOTRIMAZOLE-BETAMETHASONE 1-0.05 % EX CREA: 1.0000 "application " | TOPICAL_CREAM | Freq: Two times a day (BID) | CUTANEOUS | Status: DC

## 2014-09-13 NOTE — Progress Notes (Signed)
Patient ID: John Robinson, male   DOB: 1932/12/31, 81 y.o.   MRN: [id number]  Chief Complaint  Patient presents with  . Follow-up    chest and sinus congestion   Subjective:  HPI  This 81 year old male with history of renal failure and getting dialysis three times a week. Feels weak for the day of dialysis but better energy level by the next day. Had last dialysis yesterday. Last OV 08-11-14 for similar cough and post nasal drip/nasal congestion was helped with the addition of Claritin daily. Finished the first prescription of it 5 days ago and feel some recurrence of symptoms. Denies purulent sputum or fever. Major symptom is the rhinorrhea.  Also, want to try to get Brilinta (originally prescribed by cardiologist earlier this year with coronary stent placement) through the [place] program.  Prior to Admission medications   Medication Sig Start Date End Date Taking? Authorizing Provider  aspirin 81 MG tablet Take 81 mg by mouth daily.   Yes Historical Provider, MD  atorvastatin (LIPITOR) 80 MG tablet Take 80 mg by mouth daily.   Yes Historical Provider, MD  carvedilol (COREG) 25 MG tablet Take 2 tablets (50 mg total) by mouth 2 (two) times daily. 06/28/14  Yes Birdie Sons, MD  colesevelam [facility]) 625 MG tablet Take 1,250 mg by mouth 2 (two) times daily with a meal.    Yes Historical Provider, MD  isosorbide mononitrate (IMDUR) 60 MG 24 hr tablet Take 60 mg by mouth daily.   Yes Historical Provider, MD  lisinopril (PRINIVIL,ZESTRIL) 10 MG tablet Take 1 tablet (10 mg total) by mouth daily. 06/28/14  Yes Birdie Sons, MD  Multiple Vitamins-Minerals (MULTIVITAMIN WITH MINERALS) tablet Take 1 tablet by mouth daily.   Yes Historical Provider, MD  nitroGLYCERIN (NITROSTAT) 0.4 MG SL tablet Place 1 tablet under the tongue. 1 tablet Sublingual Every Five Mintues As Needed For Chest Pain 04/08/14  Yes Historical Provider, MD  omeprazole (PRILOSEC) 20 MG capsule Take 20 mg by  mouth daily.   Yes Historical Provider, MD  Ticagrelor (BRILINTA) 90 MG TABS tablet Take by mouth 2 (two) times daily.   Yes Historical Provider, MD  valsartan (DIOVAN) 160 MG tablet 1 tablet daily. 09/11/14  Yes Historical Provider, MD    Patient Active Problem List   Diagnosis Date Noted  . Senile purpura 06/28/2014  . Allergic rhinitis 06/08/2014  . Cervical neck pain with evidence of disc disease 06/08/2014  . Chronic kidney disease (CKD), stage IV (severe) 06/08/2014  . Contusion of forehead 06/08/2014  . Narrowing of intervertebral disc space 06/08/2014  . Dermatitis 06/08/2014  . Dizziness 06/08/2014  . Edema 06/08/2014  . ESRD on dialysis 06/08/2014  . GERD (gastroesophageal reflux disease) 06/08/2014  . History of abdominal aortic aneurysm repair 06/08/2014  . History of acute myocardial infarction 06/08/2014  . History of malignant melanoma 06/08/2014  . Hyperlipidemia 06/08/2014  . Hypertension 06/08/2014  . Neoplasm of uncertain behavior 22/29/7989  . Neck stiffness 06/08/2014  . Peripheral vascular disease 06/08/2014  . Cervical nerve root disorder 06/08/2014  . Renal artery stenosis 06/08/2014  . Renal vascular disease 06/08/2014  . Smokers' cough 06/08/2014  . Tobacco consumption 06/08/2014  . CAD (coronary artery disease) 04/12/2014  . Chest pain 04/12/2014  . End stage kidney disease 04/12/2014  . Hypertensive urgency 04/12/2014  . Acute MI 04/06/2014  . H/O cardiac catheterization 04/06/2014  . Iliac artery occlusion 04/06/2014  . Aneurysm 05/14/2012  . AAA (  abdominal aortic aneurysm) 05/14/2012  . Angiopathy, peripheral 05/14/2012  . H. pylori infection 01/14/2012  . Renal cyst 08/27/2010  . Diabetes mellitus with nephropathy 11/24/2006    Past Medical History  Diagnosis Date  . Diabetes   . Heart problem   . HBP (high blood pressure)   . High cholesterol   . Coronary artery disease   . Myocardial infarction   . Anginal pain   . CHF (congestive  heart failure)   . GERD (gastroesophageal reflux disease)   . Chicken pox   . Measles   . Mumps   . History of MI (myocardial infarction)   . Renal artery stenosis   . Peripheral arterial disease   . Allergic rhinitis   . History of malignant melanoma   . Eczema   . History of recurrent UTIs     Social History   Social History  . Marital Status: Married    Spouse Name: N/A  . Number of Children: N/A  . Years of Education: 10th grade   Occupational History  . Not on file.   Social History Main Topics  . Smoking status: Former Smoker -- 0.25 packs/day    Types: Cigarettes    Quit date: 05/17/2012  . Smokeless tobacco: Never Used  . Alcohol Use: Not on file  . Drug Use: No  . Sexual Activity: Not on file   Other Topics Concern  . Not on file   Social History Narrative    Allergies  Allergen Reactions  . Cilostazol Other (See Comments)    Disorientation/dizzy/crazy Disorientation/dizzy/crazy  Update 04/13/14 - spoke with wife who confirmed patient has been taking this for a long time without issue Disorientation/dizzy/crazy    Review of Systems  Constitutional:       Intermittent weakness associated with dialysis.  HENT: Positive for congestion.        Sneezing.  Respiratory: Positive for cough.   Cardiovascular: Negative for chest pain and palpitations.  Gastrointestinal: Negative.   Genitourinary: Negative.   Neurological: Negative.   Psychiatric/Behavioral: Negative.     Immunization History  Administered Date(s) Administered  . Influenza-Unspecified 09/14/2013  . Pneumococcal Conjugate-13 02/22/2013  . Pneumococcal Polysaccharide-23 11/13/2000   Objective:  BP 104/76 mmHg  Pulse 84  Temp(Src) 97.7 F (36.5 C) (Oral)  Resp 18  Wt 164 lb 12.8 oz (74.753 kg)  SpO2 99%  Physical Exam  Constitutional: He is oriented to person, place, and time and well-developed, well-nourished, and in no distress.  HENT:  Head: Normocephalic and atraumatic.    Nose: Nose normal.  Mouth/Throat: Oropharynx is clear and moist.  Eyes: Conjunctivae are normal. Pupils are equal, round, and reactive to light.  Neck: Normal range of motion. Neck supple. No thyromegaly present.  Cardiovascular: Normal rate and regular rhythm.   Pulmonary/Chest: Effort normal and breath sounds normal.  Abdominal: Soft. Bowel sounds are normal.  Musculoskeletal: Normal range of motion.  Left arm AVF for dialysis.  Neurological: He is alert and oriented to person, place, and time.  Skin: Skin is warm and dry. Rash noted.    Lab Results  Component Value Date   WBC 7.2 04/11/2014   HGB 8.8* 04/11/2014   HCT 28.2* 04/11/2014   PLT 236 04/11/2014   GLUCOSE 164* 04/10/2014   INR 0.9 04/10/2014   HGBA1C 5.9 06/28/2014   MICROALBUR 50 07/09/2013    CMP     Component Value Date/Time   NA 141 04/10/2014 2301   K 5.1 07/22/2014 1315  K 4.7 04/10/2014 2301   K 5.1 03/18/2014   CL 112* 04/10/2014 2301   CO2 20* 04/10/2014 2301   GLUCOSE 164* 04/10/2014 2301   BUN 99* 04/10/2014 2301   CREATININE 7.13* 04/10/2014 2301   CREATININE 5.66 03/18/2014   CALCIUM 9.0 04/10/2014 2301   PROT 7.0 11/28/2013 1010   ALBUMIN 3.3* 11/28/2013 1010   AST 24 11/28/2013 1010   ALT 20 11/28/2013 1010   ALKPHOS 166* 11/28/2013 1010   BILITOT 0.4 11/28/2013 1010   GFRNONAA 7* 04/10/2014 2301   GFRNONAA 14* 11/28/2013 1010   GFRAA 8* 04/10/2014 2301   GFRAA 17* 11/28/2013 1010    Assessment and Plan :  1. Allergic rhinitis, unspecified allergic rhinitis type Recent flare. No fever, cough or sputum production. May continue Claritin prn nasal congestion or sneezing. Should get nephrologist's opinion for other medications if no better with this.  2. Dermatitis History of eczema. Well controlled with occasional use of Lotrisone. Will refill and follow up if rash recurs and does not respond to this medication. - clotrimazole-betamethasone (LOTRISONE) cream; Apply 1 application  topically 2 (two) times daily. As needed for rash  Dispense: 30 g; Refill: 3  3. ESRD on dialysis States he feels good except the few hours after dialysis. Wonders if he still needs dialysis. Will recheck labs and advised to continue follow up with nephrologist (Dr. Candiss Norse). Doubt he can go very long without dialysis now. - CBC with Differential/Platelet - COMPLETE METABOLIC PANEL WITH GFR  4. History of acute myocardial infarction Had an MI in 2001 with stenting of RCA in 2002 and 2004. Had stenting of LAD in 2004 and 2014. Usually followed by Dr. Saralyn Pilar or Dr. Alanda Amass in [place].   Miguel Aschoff MD [facility] 09/13/2014 10:21 AM

## 2014-09-14 LAB — COMPREHENSIVE METABOLIC PANEL
ALT: 13 IU/L (ref 0–44)
AST: 15 IU/L (ref 0–40)
Albumin/Globulin Ratio: 2.1 (ref 1.1–2.5)
Albumin: 3.9 g/dL (ref 3.5–4.7)
Alkaline Phosphatase: 136 IU/L — ABNORMAL HIGH (ref 39–117)
BILIRUBIN TOTAL: 0.3 mg/dL (ref 0.0–1.2)
BUN/Creatinine Ratio: 10 (ref 10–22)
BUN: 40 mg/dL — ABNORMAL HIGH (ref 8–27)
CALCIUM: 9.1 mg/dL (ref 8.6–10.2)
CO2: 28 mmol/L (ref 18–29)
Chloride: 99 mmol/L (ref 97–108)
Creatinine, Ser: 4.14 mg/dL — ABNORMAL HIGH (ref 0.76–1.27)
GFR calc Af Amer: 15 mL/min/{1.73_m2} — ABNORMAL LOW (ref >59)
GFR, EST NON AFRICAN AMERICAN: 13 mL/min/{1.73_m2} — AB (ref >59)
GLOBULIN, TOTAL: 1.9 g/dL (ref 1.5–4.5)
Glucose: 152 mg/dL — ABNORMAL HIGH (ref 65–99)
POTASSIUM: 5.2 mmol/L (ref 3.5–5.2)
SODIUM: 146 mmol/L — AB (ref 134–144)
Total Protein: 5.8 g/dL — ABNORMAL LOW (ref 6.0–8.5)

## 2014-09-14 LAB — CBC WITH DIFFERENTIAL/PLATELET
BASOS: 1 %
Basophils Absolute: 0.1 10*3/uL (ref 0.0–0.2)
EOS (ABSOLUTE): 0.3 10*3/uL (ref 0.0–0.4)
Eos: 3 %
Hematocrit: 39 % (ref 37.5–51.0)
Hemoglobin: 12.6 g/dL (ref 12.6–17.7)
Immature Grans (Abs): 0 10*3/uL (ref 0.0–0.1)
Immature Granulocytes: 0 %
LYMPHS ABS: 1.9 10*3/uL (ref 0.7–3.1)
Lymphs: 21 %
MCH: 27.9 pg (ref 26.6–33.0)
MCHC: 32.3 g/dL (ref 31.5–35.7)
MCV: 86 fL (ref 79–97)
MONOS ABS: 0.7 10*3/uL (ref 0.1–0.9)
Monocytes: 7 %
NEUTROS PCT: 68 %
Neutrophils Absolute: 6.4 10*3/uL (ref 1.4–7.0)
Platelets: 215 10*3/uL (ref 150–379)
RBC: 4.52 x10E6/uL (ref 4.14–5.80)
RDW: 18.8 % — AB (ref 12.3–15.4)
WBC: 9.3 10*3/uL (ref 3.4–10.8)

## 2014-09-15 ENCOUNTER — Telehealth: Payer: Self-pay

## 2014-09-15 NOTE — Telephone Encounter (Signed)
-----   Message from Margo Common, [place] sent at 09/15/2014 12:32 AM EDT ----- No anemia and normal RBC's and WBC's. Blood sugar elevated and sodium up a little. Creatinine and BUN improved but still high. Looks as if the dialysis is improving these numbers but still need the dialysis to maintain improvement. May share these lab values with your nephrologist.

## 2014-09-15 NOTE — Telephone Encounter (Signed)
Patient wife and patient advised as directed below. Patient has an appointment tomorrow with the Nephrologist. Print copy of labs and put up in the front for pick up.  Thanks,  -Grenda Lora

## 2014-09-27 ENCOUNTER — Encounter: Payer: Self-pay | Admitting: Family Medicine

## 2014-09-27 ENCOUNTER — Ambulatory Visit (INDEPENDENT_AMBULATORY_CARE_PROVIDER_SITE_OTHER): Payer: Commercial Managed Care - HMO | Admitting: Family Medicine

## 2014-09-27 VITALS — BP 170/82 | HR 83 | Temp 98.4°F | Resp 16 | Wt 169.0 lb

## 2014-09-27 DIAGNOSIS — N186 End stage renal disease: Secondary | ICD-10-CM

## 2014-09-27 DIAGNOSIS — R053 Chronic cough: Secondary | ICD-10-CM

## 2014-09-27 DIAGNOSIS — R05 Cough: Secondary | ICD-10-CM | POA: Diagnosis not present

## 2014-09-27 NOTE — Progress Notes (Signed)
Patient ID: Alarik Radu, male   DOB: February 15, 1932, 81 y.o.   MRN: [id number] Name: John Robinson   MRN: [id number]    DOB: 1932-04-20   Date:09/27/2014      Progress Note  Subjective  Chief Complaint  Chief Complaint  Patient presents with  . Cough    follow up   HPI   Past Medical History  Diagnosis Date  . Diabetes   . Heart problem   . HBP (high blood pressure)   . High cholesterol   . Coronary artery disease   . Myocardial infarction   . Anginal pain   . CHF (congestive heart failure)   . GERD (gastroesophageal reflux disease)   . Chicken pox   . Measles   . Mumps   . History of MI (myocardial infarction)   . Renal artery stenosis   . Peripheral arterial disease   . Allergic rhinitis   . History of malignant melanoma   . Eczema   . History of recurrent UTIs    Family History  Problem Relation Age of Onset  . Cancer Mother     Lymphatic cancer  . Lung cancer Father   . Diabetes Brother     type 2  . Heart attack Brother     Social History  Substance Use Topics  . Smoking status: Former Smoker -- 0.25 packs/day    Types: Cigarettes    Quit date: 05/17/2012  . Smokeless tobacco: Never Used  . Alcohol Use: Not on file   Past Surgical History  Procedure Laterality Date  . Rca stent s/p mi  12/05/2000  . Arm surgery Left   . Hip surgery Left   . Appendectomy  1957  . Coronary angioplasty Right 12/22/1996    Right Iliac with stent  . Peripheral vascular catheterization N/A 05/18/2014    Procedure: A/V Shuntogram/Fistulagram;  Surgeon: Algernon Huxley, MD;  Location: [facility];  Service: Cardiovascular;  Laterality: N/A;  . Tympanoplasty Left 11/2008    Left ear/Dr. Tami Ribas  . Aorta-iliac-femoral bypass  02/2012    Aorta-left iliac bypass with iliac right femoral bypass. Repair pseudoanuerysm. Done at [facility]  . Aorta-iliac-femoral bypass  1987  . Melanoma excision  05/25/2002    Chest wall  . Lad and rca stent  02/09/1999  . Peripheral vascular  catheterization N/A 06/08/2014    Procedure: Dialysis/Perma Catheter Removal;  Surgeon: Algernon Huxley, MD;  Location: [facility];  Service: Cardiovascular;  Laterality: N/A;    Current outpatient prescriptions:  .  aspirin 81 MG tablet, Take 81 mg by mouth daily., Disp: , Rfl:  .  atorvastatin (LIPITOR) 80 MG tablet, Take 80 mg by mouth daily., Disp: , Rfl:  .  carvedilol (COREG) 25 MG tablet, Take 2 tablets (50 mg total) by mouth 2 (two) times daily., Disp: 369 tablet, Rfl: 3 .  colesevelam (WELCHOL) 625 MG tablet, Take 1,250 mg by mouth 2 (two) times daily with a meal. , Disp: , Rfl:  .  isosorbide mononitrate (IMDUR) 60 MG 24 hr tablet, Take 60 mg by mouth daily., Disp: , Rfl:  .  lisinopril (PRINIVIL,ZESTRIL) 10 MG tablet, Take 1 tablet (10 mg total) by mouth daily., Disp: 90 tablet, Rfl: 3 .  Multiple Vitamins-Minerals (MULTIVITAMIN WITH MINERALS) tablet, Take 1 tablet by mouth daily., Disp: , Rfl:  .  nitroGLYCERIN (NITROSTAT) 0.4 MG SL tablet, Place 1 tablet under the tongue. 1 tablet Sublingual Every Five Mintues As Needed For  Chest Pain, Disp: , Rfl:  .  omeprazole (PRILOSEC) 20 MG capsule, Take 20 mg by mouth daily., Disp: , Rfl:  .  Ticagrelor (BRILINTA) 90 MG TABS tablet, Take by mouth 2 (two) times daily., Disp: , Rfl:  .  valsartan (DIOVAN) 160 MG tablet, 1 tablet daily., Disp: , Rfl:   Allergies  Allergen Reactions  . Cilostazol Other (See Comments)    Disorientation/dizzy/crazy Disorientation/dizzy/crazy  Update 04/13/14 - spoke with wife who confirmed patient has been taking this for a long time without issue Disorientation/dizzy/crazy    Review of Systems  Constitutional: Negative.   HENT: Positive for congestion.   Eyes: Negative.   Cardiovascular: Negative.   Gastrointestinal: Negative.   Genitourinary: Negative.   Musculoskeletal: Negative.   Skin: Negative.   Neurological: Negative.   Endo/Heme/Allergies: Negative.    Objective  Filed Vitals:    09/27/14 1343  BP: 170/82  Pulse: 83  Temp: 98.4 F (36.9 C)  TempSrc: Oral  Resp: 16  Weight: 169 lb (76.658 kg)  SpO2: 97%   Physical Exam  Recent Results (from the past 2160 hour(s))  Potassium     Status: None   Collection Time: 07/22/14  1:15 PM  Result Value Ref Range   Potassium 5.1 3.5 - 5.1 mmol/L  CBC with Differential/Platelet     Status: Abnormal   Collection Time: 09/13/14 12:05 PM  Result Value Ref Range   WBC 9.3 3.4 - 10.8 x10E3/uL   RBC 4.52 4.14 - 5.80 x10E6/uL   Hemoglobin 12.6 12.6 - 17.7 g/dL   Hematocrit 39.0 37.5 - 51.0 %   MCV 86 79 - 97 fL   MCH 27.9 26.6 - 33.0 pg   MCHC 32.3 31.5 - 35.7 g/dL   RDW 18.8 (H) 12.3 - 15.4 %   Platelets 215 150 - 379 x10E3/uL   Neutrophils 68 %   Lymphs 21 %   Monocytes 7 %   Eos 3 %   Basos 1 %   Neutrophils Absolute 6.4 1.4 - 7.0 x10E3/uL   Lymphocytes Absolute 1.9 0.7 - 3.1 x10E3/uL   Monocytes Absolute 0.7 0.1 - 0.9 x10E3/uL   EOS (ABSOLUTE) 0.3 0.0 - 0.4 x10E3/uL   Basophils Absolute 0.1 0.0 - 0.2 x10E3/uL   Immature Granulocytes 0 %   Immature Grans (Abs) 0.0 0.0 - 0.1 x10E3/uL  Comprehensive metabolic panel     Status: Abnormal   Collection Time: 09/13/14 12:05 PM  Result Value Ref Range   Glucose 152 (H) 65 - 99 mg/dL   BUN 40 (H) 8 - 27 mg/dL   Creatinine, Ser 4.14 (H) 0.76 - 1.27 mg/dL   GFR calc non Af Amer 13 (L) >59 mL/min/1.73   GFR calc Af Amer 15 (L) >59 mL/min/1.73   BUN/Creatinine Ratio 10 10 - 22   Sodium 146 (H) 134 - 144 mmol/L   Potassium 5.2 3.5 - 5.2 mmol/L   Chloride 99 97 - 108 mmol/L   CO2 28 18 - 29 mmol/L   Calcium 9.1 8.6 - 10.2 mg/dL   Total Protein 5.8 (L) 6.0 - 8.5 g/dL   Albumin 3.9 3.5 - 4.7 g/dL   Globulin, Total 1.9 1.5 - 4.5 g/dL   Albumin/Globulin Ratio 2.1 1.1 - 2.5   Bilirubin Total 0.3 0.0 - 1.2 mg/dL   Alkaline Phosphatase 136 (H) 39 - 117 IU/L   AST 15 0 - 40 IU/L   ALT 13 0 - 44 IU/L   Assessment & Plan  1. Persistent cough  Still having some cough  with clear sputum production occasionally. No fever, sore throat or nasal congestion. May use Mucinex-DM and Claritin for post nasal drip and cough suppression. Follow up prn.  2. End stage kidney disease Last creatinine on 09-13-14 was 4.14. Encouraged to continue dialysis as instructed by Dr. Candiss Norse. Patient still has hopes of coming off the dialysis some day. Advised it is not likely.

## 2014-09-27 NOTE — Patient Instructions (Signed)
Cough, Adult  A cough is a reflex that helps clear your throat and airways. It can help heal the body or may be a reaction to an irritated airway. A cough may only last 2 or 3 weeks (acute) or may last more than 8 weeks (chronic).  CAUSES Acute cough:  Viral or bacterial infections. Chronic cough:  Infections.  Allergies.  Asthma.  Post-nasal drip.  Smoking.  Heartburn or acid reflux.  Some medicines.  Chronic lung problems (COPD).  Cancer. SYMPTOMS   Cough.  Fever.  Chest pain.  Increased breathing rate.  High-pitched whistling sound when breathing (wheezing).  Colored mucus that you cough up (sputum). TREATMENT   A bacterial cough may be treated with antibiotic medicine.  A viral cough must run its course and will not respond to antibiotics.  Your caregiver may recommend other treatments if you have a chronic cough. HOME CARE INSTRUCTIONS   Only take over-the-counter or prescription medicines for pain, discomfort, or fever as directed by your caregiver. Use cough suppressants only as directed by your caregiver.  Use a cold steam vaporizer or humidifier in your bedroom or home to help loosen secretions.  Sleep in a semi-upright position if your cough is worse at night.  Rest as needed.  Stop smoking if you smoke. SEEK IMMEDIATE MEDICAL CARE IF:   You have pus in your sputum.  Your cough starts to worsen.  You cannot control your cough with suppressants and are losing sleep.  You begin coughing up blood.  You have difficulty breathing.  You develop pain which is getting worse or is uncontrolled with medicine.  You have a fever. MAKE SURE YOU:   Understand these instructions.  Will watch your condition.  Will get help right away if you are not doing well or get worse. Document Released: 06/29/2010 Document Revised: 03/25/2011 Document Reviewed: 06/29/2010 ExitCare Patient Information 2015 [employer]. This information is not intended  to replace advice given to you by your health care provider. Make sure you discuss any questions you have with your health care provider.  

## 2014-10-03 ENCOUNTER — Ambulatory Visit (INDEPENDENT_AMBULATORY_CARE_PROVIDER_SITE_OTHER): Payer: Commercial Managed Care - HMO | Admitting: Family Medicine

## 2014-10-03 ENCOUNTER — Other Ambulatory Visit: Payer: Self-pay | Admitting: Vascular Surgery

## 2014-10-03 ENCOUNTER — Encounter: Payer: Self-pay | Admitting: Family Medicine

## 2014-10-03 VITALS — BP 134/80 | HR 85 | Temp 98.9°F | Resp 16 | Wt 165.0 lb

## 2014-10-03 DIAGNOSIS — J449 Chronic obstructive pulmonary disease, unspecified: Secondary | ICD-10-CM

## 2014-10-03 DIAGNOSIS — R05 Cough: Secondary | ICD-10-CM | POA: Diagnosis not present

## 2014-10-03 DIAGNOSIS — R059 Cough, unspecified: Secondary | ICD-10-CM

## 2014-10-03 MED ORDER — BUDESONIDE-FORMOTEROL FUMARATE 160-4.5 MCG/ACT IN AERO: 1.0000 | INHALATION_SPRAY | Freq: Two times a day (BID) | RESPIRATORY_TRACT | Status: DC

## 2014-10-03 NOTE — Progress Notes (Signed)
Patient: John Robinson Male    DOB: January 14, 1933   82 y.o.   MRN: [id number] Visit Date: 10/03/2014  Today's Provider: Lelon Huh, MD   Chief Complaint  Patient presents with  . Follow-up  . Cough  . Headache   Subjective:    Cough This is a recurrent problem. The current episode started 1 to 4 weeks ago. The problem has been unchanged. The problem occurs constantly. The cough is productive of blood-tinged sputum. Associated symptoms include headaches, postnasal drip and wheezing. Pertinent negatives include no chest pain, ear congestion, ear pain, fever, sore throat or shortness of breath. The symptoms are aggravated by lying down. He has tried OTC cough suppressant for the symptoms.  Headache  Associated symptoms include coughing. Pertinent negatives include no ear pain, fever or sore throat.    Follow-up for cough from 09/27/2014; advised to use Mucinex-DM and Claritin for post nasal drip and cough suppression. .  Allergies  Allergen Reactions  . Cilostazol Other (See Comments)    Disorientation/dizzy/crazy Disorientation/dizzy/crazy  Update 04/13/14 - spoke with wife who confirmed patient has been taking this for a long time without issue Disorientation/dizzy/crazy   Previous Medications   ASPIRIN 81 MG TABLET    Take 81 mg by mouth daily.   ATORVASTATIN (LIPITOR) 80 MG TABLET    Take 80 mg by mouth daily.   CARVEDILOL (COREG) 25 MG TABLET    Take 2 tablets (50 mg total) by mouth 2 (two) times daily.   COLESEVELAM (WELCHOL) 625 MG TABLET    Take 1,250 mg by mouth 2 (two) times daily with a meal.    ISOSORBIDE MONONITRATE (IMDUR) 60 MG 24 HR TABLET    Take 60 mg by mouth daily.   LISINOPRIL (PRINIVIL,ZESTRIL) 10 MG TABLET    Take 1 tablet (10 mg total) by mouth daily.   MULTIPLE VITAMINS-MINERALS (MULTIVITAMIN WITH MINERALS) TABLET    Take 1 tablet by mouth daily.   NITROGLYCERIN (NITROSTAT) 0.4 MG SL TABLET    Place 1 tablet under the tongue. 1 tablet Sublingual  Every Five Mintues As Needed For Chest Pain   OMEPRAZOLE (PRILOSEC) 20 MG CAPSULE    Take 20 mg by mouth daily.   TICAGRELOR (BRILINTA) 90 MG TABS TABLET    Take by mouth 2 (two) times daily.   VALSARTAN (DIOVAN) 160 MG TABLET    1 tablet daily.    Review of Systems  Constitutional: Negative for fever.  HENT: Positive for postnasal drip. Negative for ear pain and sore throat.   Respiratory: Positive for cough and wheezing. Negative for shortness of breath.   Cardiovascular: Negative for chest pain and palpitations.  Neurological: Positive for headaches.    Social History  Substance Use Topics  . Smoking status: Former Smoker -- 0.25 packs/day    Types: Cigarettes    Quit date: 05/17/2012  . Smokeless tobacco: Never Used  . Alcohol Use: Not on file   Objective:   BP 134/80 mmHg  Pulse 85  Temp(Src) 98.9 F (37.2 C) (Oral)  Resp 16  Wt 165 lb (74.844 kg)  SpO2 96%  Physical Exam   General Appearance:    Alert, cooperative, no distress  Eyes:    PERRL, conjunctiva/corneas clear, EOM's intact       Lungs:     Diffuse expiratory wheezes. No rales. Poor air movement.   Heart:    Regular rate and rhythm  Neurologic:   Awake, alert, oriented x 3. No  apparent focal neurological           defect.           Assessment & Plan:     1. Cough  - Spirometry with Graph - budesonide-formoterol (SYMBICORT) 160-4.5 MCG/ACT inhaler; Inhale 1 puff into the lungs 2 (two) times daily.  Dispense: 1 Inhaler; Refill: 0  2. Chronic obstructive pulmonary disease, unspecified COPD, unspecified chronic bronchitis type Likely diagnosis.  - budesonide-formoterol (SYMBICORT) 160-4.5 MCG/ACT inhaler; Inhale 1 puff into the lungs 2 (two) times daily.  Dispense: 1 Inhaler; Refill: 0     Return in about 4 weeks (around 10/31/2014).   Lelon Huh, MD  [facility]

## 2014-10-06 ENCOUNTER — Encounter
Admission: RE | Disposition: A | Discharge status: Home / Self Care | Payer: Self-pay | Source: Ambulatory Visit | Attending: Vascular Surgery

## 2014-10-06 ENCOUNTER — Encounter: Payer: Self-pay | Admitting: *Deleted

## 2014-10-06 ENCOUNTER — Ambulatory Visit
Admission: RE | Admit: 2014-10-06 | Discharge: 2014-10-06 | Disposition: A | Discharge status: Home / Self Care | Payer: Commercial Managed Care - HMO | Source: Ambulatory Visit | Attending: Vascular Surgery | Admitting: Vascular Surgery

## 2014-10-06 DIAGNOSIS — T82858A Stenosis of vascular prosthetic devices, implants and grafts, initial encounter: Secondary | ICD-10-CM | POA: Diagnosis not present

## 2014-10-06 DIAGNOSIS — K219 Gastro-esophageal reflux disease without esophagitis: Secondary | ICD-10-CM | POA: Diagnosis not present

## 2014-10-06 DIAGNOSIS — Z992 Dependence on renal dialysis: Secondary | ICD-10-CM | POA: Diagnosis not present

## 2014-10-06 DIAGNOSIS — Y832 Surgical operation with anastomosis, bypass or graft as the cause of abnormal reaction of the patient, or of later complication, without mention of misadventure at the time of the procedure: Secondary | ICD-10-CM | POA: Diagnosis not present

## 2014-10-06 DIAGNOSIS — E1122 Type 2 diabetes mellitus with diabetic chronic kidney disease: Secondary | ICD-10-CM | POA: Diagnosis not present

## 2014-10-06 DIAGNOSIS — Z87891 Personal history of nicotine dependence: Secondary | ICD-10-CM | POA: Diagnosis not present

## 2014-10-06 DIAGNOSIS — I120 Hypertensive chronic kidney disease with stage 5 chronic kidney disease or end stage renal disease: Secondary | ICD-10-CM | POA: Insufficient documentation

## 2014-10-06 DIAGNOSIS — Z85820 Personal history of malignant melanoma of skin: Secondary | ICD-10-CM | POA: Insufficient documentation

## 2014-10-06 DIAGNOSIS — E780 Pure hypercholesterolemia: Secondary | ICD-10-CM | POA: Insufficient documentation

## 2014-10-06 DIAGNOSIS — I2510 Atherosclerotic heart disease of native coronary artery without angina pectoris: Secondary | ICD-10-CM | POA: Diagnosis not present

## 2014-10-06 DIAGNOSIS — I509 Heart failure, unspecified: Secondary | ICD-10-CM | POA: Insufficient documentation

## 2014-10-06 DIAGNOSIS — N186 End stage renal disease: Secondary | ICD-10-CM | POA: Diagnosis not present

## 2014-10-06 DIAGNOSIS — I739 Peripheral vascular disease, unspecified: Secondary | ICD-10-CM | POA: Diagnosis not present

## 2014-10-06 DIAGNOSIS — I252 Old myocardial infarction: Secondary | ICD-10-CM | POA: Diagnosis not present

## 2014-10-06 DIAGNOSIS — I701 Atherosclerosis of renal artery: Secondary | ICD-10-CM | POA: Diagnosis not present

## 2014-10-06 HISTORY — PX: PERIPHERAL VASCULAR CATHETERIZATION: SHX172C

## 2014-10-06 LAB — GLUCOSE, CAPILLARY: GLUCOSE-CAPILLARY: 142 mg/dL — AB (ref 65–99)

## 2014-10-06 LAB — POTASSIUM (ARMC VASCULAR LAB ONLY): POTASSIUM (ARMC VASCULAR LAB): 4.3

## 2014-10-06 SURGERY — A/V SHUNTOGRAM/FISTULAGRAM
Anesthesia: Moderate Sedation

## 2014-10-06 MED ORDER — SODIUM CHLORIDE 0.9 % IV SOLN
INTRAVENOUS | Status: DC
  Administered 2014-10-06: 09:00:00 via INTRAVENOUS

## 2014-10-06 MED ORDER — DEXTROSE 5 % IV SOLN
INTRAVENOUS | Status: AC
  Administered 2014-10-06: 1.5 g via INTRAVENOUS
  Filled 2014-10-06: qty 1.5

## 2014-10-06 MED ORDER — LIDOCAINE-EPINEPHRINE (PF) 1 %-1:200000 IJ SOLN
INTRAMUSCULAR | Status: AC
  Filled 2014-10-06: qty 30

## 2014-10-06 MED ORDER — HEPARIN SODIUM (PORCINE) 1000 UNIT/ML IJ SOLN
INTRAMUSCULAR | Status: AC
  Filled 2014-10-06: qty 1

## 2014-10-06 MED ORDER — MIDAZOLAM HCL 2 MG/2ML IJ SOLN
INTRAMUSCULAR | Status: DC | PRN
Start: 2014-10-06 — End: 2014-10-06
  Administered 2014-10-06: 1 mg via INTRAVENOUS
  Administered 2014-10-06: 2 mg via INTRAVENOUS

## 2014-10-06 MED ORDER — FENTANYL CITRATE (PF) 100 MCG/2ML IJ SOLN
INTRAMUSCULAR | Status: DC | PRN
  Administered 2014-10-06: 25 ug via INTRAVENOUS
  Administered 2014-10-06: 50 ug via INTRAVENOUS

## 2014-10-06 MED ORDER — INSULIN ASPART 100 UNIT/ML ~~LOC~~ SOLN: 0.0000 [IU] | SUBCUTANEOUS | Status: DC

## 2014-10-06 MED ORDER — SODIUM CHLORIDE 0.9 % IV SOLN
INTRAVENOUS | Status: DC
  Administered 2014-10-06: 08:00:00 via INTRAVENOUS

## 2014-10-06 MED ORDER — FENTANYL CITRATE (PF) 100 MCG/2ML IJ SOLN
INTRAMUSCULAR | Status: AC
  Filled 2014-10-06: qty 2

## 2014-10-06 MED ORDER — IOHEXOL 300 MG/ML  SOLN
INTRAMUSCULAR | Status: DC | PRN
  Administered 2014-10-06: 25 mL via INTRA_ARTERIAL

## 2014-10-06 MED ORDER — HEPARIN SODIUM (PORCINE) 1000 UNIT/ML IJ SOLN
3000.0000 [IU] | Freq: Once | INTRAMUSCULAR | Status: AC
  Administered 2014-10-06: 3000 [IU] via INTRAVENOUS

## 2014-10-06 MED ORDER — HEPARIN (PORCINE) IN NACL 2-0.9 UNIT/ML-% IJ SOLN
INTRAMUSCULAR | Status: AC
  Filled 2014-10-06: qty 1000

## 2014-10-06 MED ORDER — MIDAZOLAM HCL 5 MG/5ML IJ SOLN
INTRAMUSCULAR | Status: AC
  Filled 2014-10-06: qty 5

## 2014-10-06 MED ORDER — DEXTROSE 5 % IV SOLN
1.5000 g | INTRAVENOUS | Status: AC
  Administered 2014-10-06: 1.5 g via INTRAVENOUS

## 2014-10-06 SURGICAL SUPPLY — 11 items
BALLN LUTONIX DCB 6X60X130 (BALLOONS) ×4
BALLN LUTONIX DCB 7X60X130 (BALLOONS) ×4
BALLOON LUTONIX DCB 6X60X130 (BALLOONS) IMPLANT
BALLOON LUTONIX DCB 7X60X130 (BALLOONS) IMPLANT
CANNULA 5F STIFF (CANNULA) ×2 IMPLANT
DEVICE PRESTO INFLATION (MISCELLANEOUS) ×2 IMPLANT
DRAPE BRACHIAL (DRAPES) ×2 IMPLANT
GLIDEWIRE ADV .035X260CM (WIRE) ×2 IMPLANT
PACK ANGIOGRAPHY (CUSTOM PROCEDURE TRAY) ×2 IMPLANT
SHEATH BRITE TIP 6FRX5.5 (SHEATH) ×2 IMPLANT
TOWEL OR 17X26 4PK STRL BLUE (TOWEL DISPOSABLE) ×2 IMPLANT

## 2014-10-06 NOTE — H&P (Signed)
[facility] Admission History & Physical  MRN : [id number]  John Robinson is a 81 y.o. (06/24/1932) male who presents with chief complaint of No chief complaint on file. Marland Kitchen  History of Present Illness: Patient with end-stage renal disease is sent over from his dialysis access center for poorly functioning left arm AV fistula. This fistula required multiple interventions to get it up and running earlier this year, but had been working well until the past few weeks. They have requested a fistulogram be performed for further evaluation. He has no other complaints today.  Current Facility-Administered Medications  Medication Dose Route Frequency Provider Last Rate Last Dose  . 0.9 %  sodium chloride infusion   Intravenous Continuous Algernon Huxley, MD      . 0.9 %  sodium chloride infusion   Intravenous Continuous Kimberly A Stegmayer, PA-C      . cefUROXime (ZINACEF) 1.5 g in dextrose 5 % 50 mL IVPB  1.5 g Intravenous 30 min Pre-Op Kimberly A Stegmayer, PA-C      . dextrose 5 % with cefUROXime (ZINACEF) ADS Med             Past Medical History  Diagnosis Date  . Diabetes   . Heart problem   . HBP (high blood pressure)   . High cholesterol   . Coronary artery disease   . Myocardial infarction   . Anginal pain   . CHF (congestive heart failure)   . GERD (gastroesophageal reflux disease)   . Chicken pox   . Measles   . Mumps   . History of MI (myocardial infarction)   . Renal artery stenosis   . Peripheral arterial disease   . Allergic rhinitis   . History of malignant melanoma   . Eczema   . History of recurrent UTIs     Past Surgical History  Procedure Laterality Date  . Rca stent s/p mi  12/05/2000  . Arm surgery Left   . Hip surgery Left   . Appendectomy  1957  . Coronary angioplasty Right 12/22/1996    Right Iliac with stent  . Peripheral vascular catheterization N/A 05/18/2014    Procedure: A/V Shuntogram/Fistulagram;  Surgeon: Algernon Huxley,  MD;  Location: [facility];  Service: Cardiovascular;  Laterality: N/A;  . Tympanoplasty Left 11/2008    Left ear/Dr. Tami Ribas  . Aorta-iliac-femoral bypass  02/2012    Aorta-left iliac bypass with iliac right femoral bypass. Repair pseudoanuerysm. Done at [facility]  . Aorta-iliac-femoral bypass  1987  . Melanoma excision  05/25/2002    Chest wall  . Lad and rca stent  02/09/1999  . Peripheral vascular catheterization N/A 06/08/2014    Procedure: Dialysis/Perma Catheter Removal;  Surgeon: Algernon Huxley, MD;  Location: [facility];  Service: Cardiovascular;  Laterality: N/A;    Social History Social History  Substance Use Topics  . Smoking status: Former Smoker -- 0.25 packs/day    Types: Cigarettes    Quit date: 05/17/2012  . Smokeless tobacco: Never Used  . Alcohol Use: No  married, lives with wife  Family History Family History  Problem Relation Age of Onset  . Cancer Mother     Lymphatic cancer  . Lung cancer Father   . Diabetes Brother     type 2  . Heart attack Brother     Allergies  Allergen Reactions  . Cilostazol Other (See Comments)    Disorientation/dizzy/crazy Disorientation/dizzy/crazy  Update 04/13/14 -  spoke with wife who confirmed patient has been taking this for a long time without issue Disorientation/dizzy/crazy     REVIEW OF SYSTEMS (Negative unless checked)  Constitutional: [] Weight loss  [] Fever  [] Chills Cardiac: [] Chest pain   [] Chest pressure   [] Palpitations   [] Shortness of breath when laying flat   [] Shortness of breath at rest   [] Shortness of breath with exertion. Vascular:  [x] Pain in legs with walking   [] Pain in legs at rest   [] Pain in legs when laying flat   [x] Claudication   [] Pain in feet when walking  [] Pain in feet at rest  [] Pain in feet when laying flat   [] History of DVT   [] Phlebitis   [] Swelling in legs   [] Varicose veins   [] Non-healing ulcers Pulmonary:   [] Uses home oxygen   [] Productive cough   [] Hemoptysis    [] Wheeze  [] COPD   [] Asthma Neurologic:  [] Dizziness  [] Blackouts   [] Seizures   [] History of stroke   [] History of TIA  [] Aphasia   [] Temporary blindness   [] Dysphagia   [] Weakness or numbness in arms   [] Weakness or numbness in legs Musculoskeletal:  [] Arthritis   [] Joint swelling   [] Joint pain   [] Low back pain Hematologic:  [] Easy bruising  [] Easy bleeding   [] Hypercoagulable state   [] Anemic  [] Hepatitis Gastrointestinal:  [] Blood in stool   [] Vomiting blood  [] Gastroesophageal reflux/heartburn   [] Difficulty swallowing. Genitourinary:  [x] Chronic kidney disease   [] Difficult urination  [] Frequent urination  [] Burning with urination   [] Blood in urine Skin:  [] Rashes   [] Ulcers   [] Wounds Psychological:  [] History of anxiety   []  History of major depression.  Physical Examination  Filed Vitals:   10/06/14 0733  BP: 129/69  Pulse: 86  Temp: 98.4 F (36.9 C)  TempSrc: Oral  Resp: 21  Height: 5\' 11"  (1.803 m)  Weight: 76.204 kg (168 lb)   Body mass index is 23.44 kg/(m^2). Gen: WD/WN, NAD Head: [place]/AT, No temporalis wasting. Prominent temp pulse not noted. Ear/Nose/Throat: Hearing grossly intact, nares w/o erythema or drainage, oropharynx w/o Erythema/Exudate,  Eyes: PERRLA, EOMI.  Neck: Supple, no nuchal rigidity.  No bruit or JVD.  Pulmonary:  Good air movement, clear to auscultation bilaterally, no use of accessory muscles.  Cardiac: RRR, normal S1, S2, no Murmurs, rubs or gallops. Vascular: left arm AVF with bruit, soft thrill but more pulsatile Vessel Right Left  Radial Palpable Palpable  Ulnar Palpable Palpable  Brachial Palpable Palpable  Carotid Palpable, without bruit Palpable, without bruit  Aorta Not palpable N/A  Femoral Palpable Palpable  Popliteal Not Palpable Not Palpable  PT Palpable Not Palpable  DP Not Palpable Palpable   Gastrointestinal: soft, non-tender/non-distended. No guarding/reflex.  Musculoskeletal: M/S 5/5 throughout.  Extremities without  ischemic changes.  No deformity or atrophy.  Neurologic: CN 2-12 intact. Pain and light touch intact in extremities.  Symmetrical.  Speech is fluent. Motor exam as listed above. Psychiatric: Judgment intact, Mood & affect appropriate for pt's clinical situation. Dermatologic: No rashes or ulcers noted.  No cellulitis or open wounds. Lymph : No Cervical, Axillary, or Inguinal lymphadenopathy.     CBC Lab Results  Component Value Date   WBC 9.3 09/13/2014   HGB 8.8* 04/11/2014   HCT 39.0 09/13/2014   MCV 91 04/11/2014   PLT 236 04/11/2014    BMET    Component Value Date/Time   NA 146* 09/13/2014 1205   NA 141 04/10/2014 2301   K 5.2 09/13/2014 1205  K 4.7 04/10/2014 2301   K 5.1 03/18/2014   CL 99 09/13/2014 1205   CL 112* 04/10/2014 2301   CO2 28 09/13/2014 1205   CO2 20* 04/10/2014 2301   GLUCOSE 152* 09/13/2014 1205   GLUCOSE 164* 04/10/2014 2301   BUN 40* 09/13/2014 1205   BUN 99* 04/10/2014 2301   CREATININE 4.14* 09/13/2014 1205   CREATININE 7.13* 04/10/2014 2301   CREATININE 5.66 03/18/2014   CALCIUM 9.1 09/13/2014 1205   CALCIUM 9.0 04/10/2014 2301   GFRNONAA 13* 09/13/2014 1205   GFRNONAA 7* 04/10/2014 2301   GFRNONAA 14* 11/28/2013 1010   GFRAA 15* 09/13/2014 1205   GFRAA 8* 04/10/2014 2301   GFRAA 17* 11/28/2013 1010   CrCl cannot be calculated (Patient has no serum creatinine result on file.).  COAG Lab Results  Component Value Date   INR 0.9 04/10/2014   INR 0.9 11/28/2013   INR 1.0 11/04/2013    Radiology No results found.    Assessment/Plan 1. ESRD. Using left arm AVF 2. Dysfunction of dialysis access: will perform fistulagram today.  Risks and benefits discussed 3. PAD. S/p multiple previous interventions 4. HTN. stable   DEW,JASON, MD  10/06/2014 8:08 AM

## 2014-10-06 NOTE — Discharge Instructions (Signed)
Fistulogram, Care After °Refer to this sheet in the next few weeks. These instructions provide you with information on caring for yourself after your procedure. Your health care provider may also give you more specific instructions. Your treatment has been planned according to current medical practices, but problems sometimes occur. Call your health care provider if you have any problems or questions after your procedure. °WHAT TO EXPECT AFTER THE PROCEDURE °After your procedure, it is typical to have the following: °· A small amount of discomfort in the area where the catheters were placed. °· A small amount of bruising around the fistula. °· Sleepiness and fatigue. °HOME CARE INSTRUCTIONS °· Rest at home for the day following your procedure. °· Do not drive or operate heavy machinery while taking pain medicine. °· Take medicines only as directed by your health care provider. °· Do not take baths, swim, or use a hot tub until your health care provider approves. You may shower 24 hours after the procedure or as directed by your health care provider. °· There are many different ways to close and cover an incision, including stitches, skin glue, and adhesive strips. Follow your health care provider's instructions on: °¨ Incision care. °¨ Bandage (dressing) changes and removal. °¨ Incision closure removal. °· Monitor your dialysis fistula carefully. °SEEK MEDICAL CARE IF: °· You have drainage, redness, swelling, or pain at your catheter site. °· You have a fever. °· You have chills. °SEEK IMMEDIATE MEDICAL CARE IF: °· You feel weak. °· You have trouble balancing. °· You have trouble moving your arms or legs. °· You have problems with your speech or vision. °· You can no longer feel a vibration or buzz when you put your fingers over your dialysis fistula. °· The limb that was used for the procedure: °¨ Swells. °¨ Is painful. °¨ Is cold. °¨ Is discolored, such as blue or pale white. °Document Released: 05/17/2013  Document Reviewed: 02/19/2013 °Exit[employer]® Patient Information ©2015 [employer]. This information is not intended to replace advice given to you by your health care provider. Make sure you discuss any questions you have with your health care provider. ° °

## 2014-10-06 NOTE — OR Nursing (Signed)
Brilinta taken yesterday, reported to vascular staff, C, Gibbons,RCIS and MD being texted. H&P out of date....reported to vascular staff.

## 2014-10-06 NOTE — Op Note (Signed)
[facility]    OPERATIVE NOTE   PROCEDURE: 1.   Left brachiocephalic arteriovenous fistula cannulation under ultrasound guidance 2.   Left arm fistulagram including central venogram 3.   Percutaneous transluminal angioplasty of mid upper arm cephalic vein in-stent stenosis with 7 mm diameter by 6 cm length Lutonix drug-coated angioplasty balloon 4.   Percutaneous transluminal angioplasty of cephalic vein subclavian vein confluence with 6 mm diameter by 6 cm length Lutonix drug-coated angioplasty balloon  PRE-OPERATIVE DIAGNOSIS: 1. ESRD 2. Poorly functional left brachiocephalic AVF  POST-OPERATIVE DIAGNOSIS: same as above   SURGEON: Leotis Pain, MD  ANESTHESIA: local with MCS  ESTIMATED BLOOD LOSS: 25 cc  FINDING(S): 1. In-stent stenosis in the mid upper arm cephalic vein of about 44% and stenosis just proximal to the previously placed stent at the cephalic vein subclavian vein confluence of 70-75%. Both significantly improved after angioplasty  SPECIMEN(S):  None  CONTRAST: 25 cc  INDICATIONS: John Robinson is a 81 y.o. male who presents with malfunctioning  left brachiocephalic arteriovenous fistula.  The patient is scheduled for  left arm fistulagram.  The patient is aware the risks include but are not limited to: bleeding, infection, thrombosis of the cannulated access, and possible anaphylactic reaction to the contrast.  The patient is aware of the risks of the procedure and elects to proceed forward.  DESCRIPTION: After full informed written consent was obtained, the patient was brought back to the angiography suite and placed supine upon the angiography table.  The patient was connected to monitoring equipment.  The  left arm was prepped and draped in the standard fashion for a percutaneous access intervention.  Under ultrasound guidance, the  left brachiocephalic arteriovenous fistula was cannulated with a micropuncture needle under direct  ultrasound guidance and a permanent image was performed.  The microwire was advanced into the fistula and the needle was exchanged for the a microsheath.  I then upsized to a 6 Fr Sheath and imaging was performed.  Hand injections were completed to image the access including the central venous system. This demonstrated about a 60% in-stent stenosis at the access site and about a 70-75% stenosis at the cephalic vein subclavian vein confluence at the proximal edge of the previously placed stent.  The remainder of the fistula appeared widely patent. Based on the images, this patient will need intervention to these areas. I then gave the patient 3000 units of intravenous heparin.  I then crossed the stenoses with a Magic Tourqe wire.  Based on the imaging, a 7 mm x 6 cm  Lutonix drug-coated angioplasty balloon was selected for the in-stent stenosis.  The balloon was centered around the in-stent stenosis and inflated to 12 ATM for 1 minute(s).  On completion imaging, a less than 10 % residual stenosis was present.   I then turned my attention to the cephalic vein/subclavian vein confluence. I had already crossed this lesion with the advantage wire. A 6 mm diameter by 6 cm length Lutonix drug-coated angioplasty balloon was selected and centered around the cephalic vein subclavian vein confluence stenosis. It was inflated to 10 atm for 1 minute. On completion angiogram, a 10-15% residual stenosis was all that was present.  Based on the completion imaging, no further intervention is necessary.  The wire and balloon were removed from the sheath.  A 4-0 Monocryl purse-string suture was sewn around the sheath.  The sheath was removed while tying down the suture.  A sterile bandage was applied to  the puncture site.  COMPLICATIONS: None  CONDITION: Stable   DEW,JASON  10/06/2014 9:03 AM

## 2014-10-07 ENCOUNTER — Encounter: Payer: Self-pay | Admitting: Vascular Surgery

## 2014-10-18 ENCOUNTER — Ambulatory Visit: Payer: Commercial Managed Care - HMO | Admitting: Family Medicine

## 2014-10-18 NOTE — Patient Outreach (Signed)
[facility]) Care Management  10/18/2014  Chriss Mannan [birth year] [id number]   Referral from [facility] tier 4 list, assigned to Quinn Plowman, RN for patient outreach.  Giorgi Debruin L. Louetta Hollingshead, [place] Care Management Assistant

## 2014-10-20 ENCOUNTER — Other Ambulatory Visit: Payer: Self-pay

## 2014-10-20 NOTE — Patient Outreach (Signed)
[name]) Care Management  10/20/2014  John Robinson [birth year] [id number]  Telephone outreach to patient regarding [employer] HMO high risk.  Unable to reach patient. HIPAA compliant voice message left with call back phone number.   PLAN: RNCM will attempt 2nd telephone outreach to patient within  3 business days.   Quinn Plowman RN,BSN,CCM [place] Coordinator [phone]

## 2014-10-25 ENCOUNTER — Ambulatory Visit (INDEPENDENT_AMBULATORY_CARE_PROVIDER_SITE_OTHER): Payer: Commercial Managed Care - HMO | Admitting: Family Medicine

## 2014-10-25 ENCOUNTER — Other Ambulatory Visit: Payer: Self-pay

## 2014-10-25 ENCOUNTER — Encounter: Payer: Self-pay | Admitting: Family Medicine

## 2014-10-25 VITALS — BP 84/48 | HR 84 | Temp 98.6°F | Resp 16 | Wt 167.0 lb

## 2014-10-25 DIAGNOSIS — E1121 Type 2 diabetes mellitus with diabetic nephropathy: Secondary | ICD-10-CM | POA: Diagnosis not present

## 2014-10-25 DIAGNOSIS — N186 End stage renal disease: Secondary | ICD-10-CM | POA: Diagnosis not present

## 2014-10-25 DIAGNOSIS — Z992 Dependence on renal dialysis: Secondary | ICD-10-CM

## 2014-10-25 DIAGNOSIS — I150 Renovascular hypertension: Secondary | ICD-10-CM | POA: Diagnosis not present

## 2014-10-25 DIAGNOSIS — J449 Chronic obstructive pulmonary disease, unspecified: Secondary | ICD-10-CM | POA: Diagnosis not present

## 2014-10-25 DIAGNOSIS — R05 Cough: Secondary | ICD-10-CM | POA: Diagnosis not present

## 2014-10-25 DIAGNOSIS — R059 Cough, unspecified: Secondary | ICD-10-CM

## 2014-10-25 DIAGNOSIS — I252 Old myocardial infarction: Secondary | ICD-10-CM

## 2014-10-25 MED ORDER — GLIPIZIDE 5 MG PO TABS: 5.0000 mg | ORAL_TABLET | Freq: Every day | ORAL | Status: DC

## 2014-10-25 MED ORDER — BUDESONIDE-FORMOTEROL FUMARATE 160-4.5 MCG/ACT IN AERO: 1.0000 | INHALATION_SPRAY | Freq: Two times a day (BID) | RESPIRATORY_TRACT | Status: DC

## 2014-10-25 MED ORDER — VALSARTAN 160 MG PO TABS: 80.0000 mg | ORAL_TABLET | Freq: Every day | ORAL | Status: DC

## 2014-10-25 NOTE — Patient Outreach (Signed)
[facility]) Care Management  10/25/2014  John Robinson 12-Sep-1932 [id number]   SUBJECTIVE: Telephone call to patient regarding [facility] tier 4 referral.  HIPAA verified with patient.  Patient request RNCM speak with his wife [name]  Regarding all of his medical information.  Wife states may be interested in program but will be away for a few weeks.  Wife declined services at this time.  Wife expressed agreement to receive [facility] care management outreach letter and brochure for review.  RNCM advised patient/wife to contact [facility] care management if services are needed upon patients return.  PLAN; RNCM will forward patient to [place] to close due to patient being out of town until November 2016. RNCM will send patient outreach letter and brochure for review. Patient will contact [place] if services are needed.  RNCM will notify primary MD of referral status.    Quinn Plowman RN,BSN,CCM [place] Coordinator [phone]

## 2014-10-25 NOTE — Progress Notes (Signed)
Patient: John Robinson Male    DOB: 1932-12-28   81 y.o.   MRN: [id number] Visit Date: 10/25/2014  Today's Provider: Lelon Huh, MD   Chief Complaint  Patient presents with  . Follow-up  . COPD   Subjective:    HPI  Patient was seen on 10/03/2014 for persistent cough since July. Spirometry was done that was consistent with severe obstruction, but of poor quality. He was started on samples of Symbicort 160 BID. He states he is much less short of breath, but continues to have persistent cough. Has also developed some sinus congestion and drainage the last several days  Blood pressure: His wife reports that his blood pressure has been very volatile lately. It is often in the 80s/40s in the morning, and after getting dialysis, but other days systolic BP gets into the 160s. He feels very fatigued and weak on days that his blood pressure dropped. Nephrologist put him on valsartan several months ago due to very blood pressure, but did not take this or carvedilol today due to BP being high this morning.     Allergies  Allergen Reactions  . Cilostazol Other (See Comments)    Disorientation/dizzy/crazy Disorientation/dizzy/crazy  Update 04/13/14 - spoke with wife who confirmed patient has been taking this for a long time without issue Disorientation/dizzy/crazy   Previous Medications   ASPIRIN 81 MG TABLET    Take 81 mg by mouth daily.   ATORVASTATIN (LIPITOR) 80 MG TABLET    Take 80 mg by mouth daily.   BUDESONIDE-FORMOTEROL (SYMBICORT) 160-4.5 MCG/ACT INHALER    Inhale 1 puff into the lungs 2 (two) times daily.   CARVEDILOL (COREG) 25 MG TABLET    Take 2 tablets (50 mg total) by mouth 2 (two) times daily.   COLESEVELAM (WELCHOL) 625 MG TABLET    Take 1,250 mg by mouth 2 (two) times daily with a meal.    INSULIN GLARGINE (LANTUS) 100 UNIT/ML SOLOSTAR PEN    Inject 5 Units into the skin daily at 10 pm.   ISOSORBIDE MONONITRATE (IMDUR) 60 MG 24 HR TABLET    Take 60 mg by  mouth daily.   LIDOCAINE-PRILOCAINE, BULK, 2.5-2.5 % CREA    by Does not apply route.   LISINOPRIL (PRINIVIL,ZESTRIL) 10 MG TABLET    Take 1 tablet (10 mg total) by mouth daily.   MULTIPLE VITAMINS-MINERALS (MULTIVITAMIN WITH MINERALS) TABLET    Take 1 tablet by mouth daily.   NITROGLYCERIN (NITROSTAT) 0.4 MG SL TABLET    Place 1 tablet under the tongue. 1 tablet Sublingual Every Five Mintues As Needed For Chest Pain   OMEPRAZOLE (PRILOSEC) 20 MG CAPSULE    Take 20 mg by mouth daily.   TICAGRELOR (BRILINTA) 90 MG TABS TABLET    Take by mouth 2 (two) times daily.   VALSARTAN (DIOVAN) 160 MG TABLET    1 tablet daily.    Review of Systems  Constitutional: Positive for fatigue.  Respiratory: Positive for cough and wheezing.   Cardiovascular: Negative for chest pain and palpitations.  Neurological: Positive for weakness. Negative for dizziness and light-headedness.    Social History  Substance Use Topics  . Smoking status: Former Smoker -- 0.25 packs/day    Types: Cigarettes    Quit date: 05/17/2012  . Smokeless tobacco: Never Used  . Alcohol Use: No   Objective:   BP 84/48 mmHg  Pulse 84  Temp(Src) 98.6 F (37 C) (Oral)  Resp 16  Wt 167  lb (75.751 kg)  SpO2 98%  Physical Exam  General Appearance:    Alert, cooperative, no distress  Eyes:    PERRL, conjunctiva/corneas clear, EOM's intact       Lungs:     Clear to auscultation bilaterally, respirations unlabored  Heart:    Regular rate and rhythm  Neurologic:   Awake, alert, oriented x 3. No apparent focal neurological           defect.            Assessment & Plan:     1. Cough Improved but not resolved since starting Symbicort. Likely aggravated by seasonal changes.   2. Chronic obstructive pulmonary disease, unspecified COPD, unspecified chronic bronchitis type Dyspnea significantly improved. Continue Symbicort - budesonide-formoterol (SYMBICORT) 160-4.5 MCG/ACT inhaler; Inhale 1 puff into the lungs 2 (two) times  daily.  Dispense: 1 Inhaler; Refill: 5   3. ESRD on dialysis [facility]) Continue dialysis  4. Renovascular hypertension Labile blood pressure. Will reduce routine dose of valsartan to 1/2 tablet and take the other half only if SBP >140 for the time being. - valsartan (DIOVAN) 160 MG tablet; Take 0.5-1 tablets (80-160 mg total) by mouth daily.  Dispense: 1 tablet; Refill: 1  5. Diabetes mellitus with nephropathy (HCC) BP has been running in the upper 100s and wife has been given him Lantus again, but cannot affort to get it refilled. They would like to change to oral medication.  - glipiZIDE (GLUCOTROL) 5 MG tablet; Take 1 tablet (5 mg total) by mouth daily before breakfast.  Dispense: 30 tablet; Refill: Lynden, MD  [facility]

## 2014-10-25 NOTE — Patient Outreach (Signed)
[facility]) Care Management  10/25/2014  John Robinson Jun 05, 1932 [id number]   Notification received from Quinn Plowman, RN to close case due to patient refusing services.  Chaise Passarella L. Courtnie Brenes, [place] Care Management Assistant

## 2014-11-01 ENCOUNTER — Ambulatory Visit: Payer: Commercial Managed Care - HMO | Admitting: Family Medicine

## 2014-12-02 ENCOUNTER — Emergency Department: Payer: Commercial Managed Care - HMO

## 2014-12-02 ENCOUNTER — Emergency Department
Admission: EM | Admit: 2014-12-02 | Discharge: 2014-12-03 | Disposition: A | Discharge status: Other Acute Inpatient Hospital | Payer: Commercial Managed Care - HMO | Attending: Emergency Medicine | Admitting: Emergency Medicine

## 2014-12-02 DIAGNOSIS — I252 Old myocardial infarction: Secondary | ICD-10-CM | POA: Diagnosis not present

## 2014-12-02 DIAGNOSIS — I120 Hypertensive chronic kidney disease with stage 5 chronic kidney disease or end stage renal disease: Secondary | ICD-10-CM | POA: Diagnosis not present

## 2014-12-02 DIAGNOSIS — Z7951 Long term (current) use of inhaled steroids: Secondary | ICD-10-CM | POA: Diagnosis not present

## 2014-12-02 DIAGNOSIS — E119 Type 2 diabetes mellitus without complications: Secondary | ICD-10-CM | POA: Insufficient documentation

## 2014-12-02 DIAGNOSIS — Z87891 Personal history of nicotine dependence: Secondary | ICD-10-CM | POA: Diagnosis not present

## 2014-12-02 DIAGNOSIS — Z79899 Other long term (current) drug therapy: Secondary | ICD-10-CM | POA: Insufficient documentation

## 2014-12-02 DIAGNOSIS — I161 Hypertensive emergency: Secondary | ICD-10-CM | POA: Diagnosis not present

## 2014-12-02 DIAGNOSIS — N186 End stage renal disease: Secondary | ICD-10-CM | POA: Diagnosis not present

## 2014-12-02 DIAGNOSIS — E875 Hyperkalemia: Secondary | ICD-10-CM | POA: Diagnosis not present

## 2014-12-02 DIAGNOSIS — R778 Other specified abnormalities of plasma proteins: Secondary | ICD-10-CM

## 2014-12-02 DIAGNOSIS — R7989 Other specified abnormal findings of blood chemistry: Secondary | ICD-10-CM

## 2014-12-02 DIAGNOSIS — R079 Chest pain, unspecified: Secondary | ICD-10-CM | POA: Insufficient documentation

## 2014-12-02 DIAGNOSIS — E1121 Type 2 diabetes mellitus with diabetic nephropathy: Secondary | ICD-10-CM | POA: Insufficient documentation

## 2014-12-02 DIAGNOSIS — Z992 Dependence on renal dialysis: Secondary | ICD-10-CM | POA: Insufficient documentation

## 2014-12-02 LAB — CBC WITH DIFFERENTIAL/PLATELET
BASOS PCT: 1 %
Basophils Absolute: 0.1 10*3/uL (ref 0–0.1)
Eosinophils Absolute: 0.5 10*3/uL (ref 0–0.7)
Eosinophils Relative: 3 %
HEMATOCRIT: 39.3 % — AB (ref 40.0–52.0)
Hemoglobin: 13 g/dL (ref 13.0–18.0)
LYMPHS ABS: 1.7 10*3/uL (ref 1.0–3.6)
LYMPHS PCT: 11 %
MCH: 31.5 pg (ref 26.0–34.0)
MCHC: 33 g/dL (ref 32.0–36.0)
MCV: 95.5 fL (ref 80.0–100.0)
MONO ABS: 0.8 10*3/uL (ref 0.2–1.0)
MONOS PCT: 5 %
NEUTROS ABS: 12.1 10*3/uL — AB (ref 1.4–6.5)
Neutrophils Relative %: 80 %
Platelets: 213 10*3/uL (ref 150–440)
RBC: 4.12 MIL/uL — ABNORMAL LOW (ref 4.40–5.90)
RDW: 16.3 % — AB (ref 11.5–14.5)
WBC: 15.3 10*3/uL — ABNORMAL HIGH (ref 3.8–10.6)

## 2014-12-02 LAB — COMPREHENSIVE METABOLIC PANEL
ALT: 21 U/L (ref 17–63)
AST: 33 U/L (ref 15–41)
Albumin: 4 g/dL (ref 3.5–5.0)
Alkaline Phosphatase: 132 U/L — ABNORMAL HIGH (ref 38–126)
Anion gap: 10 (ref 5–15)
BUN: 64 mg/dL — AB (ref 6–20)
CHLORIDE: 102 mmol/L (ref 101–111)
CO2: 26 mmol/L (ref 22–32)
CREATININE: 6.74 mg/dL — AB (ref 0.61–1.24)
Calcium: 9 mg/dL (ref 8.9–10.3)
GFR calc Af Amer: 8 mL/min — ABNORMAL LOW (ref >60)
GFR calc non Af Amer: 7 mL/min — ABNORMAL LOW (ref >60)
Glucose, Bld: 293 mg/dL — ABNORMAL HIGH (ref 65–99)
Potassium: 6.1 mmol/L — ABNORMAL HIGH (ref 3.5–5.1)
SODIUM: 138 mmol/L (ref 135–145)
Total Bilirubin: 0.9 mg/dL (ref 0.3–1.2)
Total Protein: 7.1 g/dL (ref 6.5–8.1)

## 2014-12-02 LAB — TROPONIN I: Troponin I: 0.05 ng/mL — ABNORMAL HIGH (ref <0.031)

## 2014-12-02 LAB — APTT: aPTT: 31 seconds (ref 24–36)

## 2014-12-02 LAB — PROTIME-INR
INR: 0.99
Prothrombin Time: 13.3 seconds (ref 11.4–15.0)

## 2014-12-02 MED ORDER — NITROGLYCERIN 0.4 MG SL SUBL
0.4000 mg | SUBLINGUAL_TABLET | SUBLINGUAL | Status: DC | PRN
  Administered 2014-12-02 (×2): 0.4 mg via SUBLINGUAL

## 2014-12-02 MED ORDER — CALCIUM GLUCONATE 10 % IV SOLN
1.0000 g | Freq: Once | INTRAVENOUS | Status: AC
  Administered 2014-12-02: 1 g via INTRAVENOUS
  Filled 2014-12-02: qty 10

## 2014-12-02 MED ORDER — NITROGLYCERIN IN D5W 200-5 MCG/ML-% IV SOLN
0.0000 ug/min | Freq: Once | INTRAVENOUS | Status: AC
  Administered 2014-12-02: 5 ug/min via INTRAVENOUS
  Filled 2014-12-02: qty 250

## 2014-12-02 MED ORDER — SODIUM BICARBONATE 8.4 % IV SOLN
50.0000 meq | Freq: Once | INTRAVENOUS | Status: AC
  Administered 2014-12-03: 50 meq via INTRAVENOUS
  Filled 2014-12-02: qty 50

## 2014-12-02 MED ORDER — ASPIRIN 81 MG PO CHEW
162.0000 mg | CHEWABLE_TABLET | Freq: Once | ORAL | Status: AC
Start: 2014-12-02 — End: 2014-12-02
  Administered 2014-12-02: 162 mg via ORAL

## 2014-12-02 MED ORDER — NITROGLYCERIN 0.4 MG SL SUBL
SUBLINGUAL_TABLET | SUBLINGUAL | Status: AC
  Administered 2014-12-02: 0.4 mg via SUBLINGUAL
  Filled 2014-12-02: qty 1

## 2014-12-02 MED ORDER — INSULIN ASPART 100 UNIT/ML ~~LOC~~ SOLN
10.0000 [IU] | Freq: Once | SUBCUTANEOUS | Status: AC
  Administered 2014-12-03: 10 [IU] via INTRAVENOUS
  Filled 2014-12-02: qty 10

## 2014-12-02 MED ORDER — NITROGLYCERIN 0.4 MG SL SUBL
0.4000 mg | SUBLINGUAL_TABLET | Freq: Once | SUBLINGUAL | Status: AC
  Administered 2014-12-02: 0.4 mg via SUBLINGUAL

## 2014-12-02 MED ORDER — ASPIRIN 81 MG PO CHEW
81.0000 mg | CHEWABLE_TABLET | Freq: Once | ORAL | Status: AC
  Administered 2014-12-02: 81 mg via ORAL

## 2014-12-02 MED ORDER — DEXTROSE 50 % IV SOLN
1.0000 | Freq: Once | INTRAVENOUS | Status: AC
  Administered 2014-12-03: 50 mL via INTRAVENOUS
  Filled 2014-12-02: qty 50

## 2014-12-02 MED ORDER — CARVEDILOL 25 MG PO TABS: 25.0000 mg | ORAL_TABLET | Freq: Two times a day (BID) | ORAL | Status: DC

## 2014-12-02 NOTE — ED Notes (Signed)
Troponin 0.05 per lab. Dr. Reita Cliche notified.

## 2014-12-02 NOTE — ED Provider Notes (Addendum)
[facility] Emergency Department Provider Note   ____________________________________________  Time seen: Upon arrival in ED room I have reviewed the triage vital signs and the triage nursing note.  HISTORY  Chief Complaint Chest Pain   Historian Patient and wife  HPI John Robinson is a 81 y.o. male who has a history of prior MI years ago, hypertension, peripheral arterial disease, who had acute onset of central chest pain partially one hour prior to arrival. He took 1 baby aspirin this morning and a second baby aspirin when the chest pain started. He took 3 sublingual nitros which helped a small amount home.he's not been recently ill. Chest pain was nonexertional. He is having some nausea. He is having diaphoresis. He's had mild shortness of breath. No cough. No fever.symptoms are moderate to severe. He is a dialysis patient.    Past Medical History  Diagnosis Date  . Diabetes ([place])   . Heart problem   . HBP (high blood pressure)   . High cholesterol   . Coronary artery disease   . Myocardial infarction ([place])   . Anginal pain ([place])   . CHF (congestive heart failure) ([place])   . GERD (gastroesophageal reflux disease)   . Chicken pox   . Measles   . Mumps   . History of MI (myocardial infarction)   . Renal artery stenosis ([place])   . Peripheral arterial disease ([place])   . Allergic rhinitis   . History of malignant melanoma   . Eczema   . History of recurrent UTIs     Patient Active Problem List   Diagnosis Date Noted  . COPD (chronic obstructive pulmonary disease) ([place]) 10/03/2014  . Senile purpura ([place]) 06/28/2014  . Allergic rhinitis 06/08/2014  . Cervical neck pain with evidence of disc disease 06/08/2014  . Chronic kidney disease (CKD), stage IV (severe) ([place]) 06/08/2014  . Contusion of forehead 06/08/2014  . Narrowing of intervertebral disc space 06/08/2014  . Dermatitis 06/08/2014  . Dizziness 06/08/2014  . Edema 06/08/2014  . ESRD on  dialysis ([place]) 06/08/2014  . GERD (gastroesophageal reflux disease) 06/08/2014  . History of abdominal aortic aneurysm repair 06/08/2014  . History of acute myocardial infarction 06/08/2014  . History of malignant melanoma 06/08/2014  . Hyperlipidemia 06/08/2014  . Hypertension 06/08/2014  . Neoplasm of uncertain behavior [id number]  . Neck stiffness 06/08/2014  . Peripheral vascular disease ([place]) 06/08/2014  . Cervical nerve root disorder 06/08/2014  . Renal artery stenosis ([place]) 06/08/2014  . Renal vascular disease 06/08/2014  . Smokers' cough ([place]) 06/08/2014  . Tobacco consumption 06/08/2014  . CAD (coronary artery disease) 04/12/2014  . Chest pain 04/12/2014  . End stage kidney disease ([place]) 04/12/2014  . Hypertensive urgency 04/12/2014  . Acute MI ([place]) 04/06/2014  . H/O cardiac catheterization 04/06/2014  . Iliac artery occlusion (HCC) 04/06/2014  . Aneurysm ([place]) 05/14/2012  . AAA (abdominal aortic aneurysm) ([place]) 05/14/2012  . Angiopathy, peripheral ([place]) 05/14/2012  . H. pylori infection 01/14/2012  . Renal cyst 08/27/2010  . Diabetes mellitus with nephropathy ([place]) 11/24/2006    Past Surgical History  Procedure Laterality Date  . Rca stent s/p mi  12/05/2000  . Arm surgery Left   . Hip surgery Left   . Appendectomy  1957  . Coronary angioplasty Right 12/22/1996    Right Iliac with stent  . Peripheral vascular catheterization N/A 05/18/2014    Procedure: A/V Shuntogram/Fistulagram;  Surgeon: Algernon Huxley, MD;  Location: [place] [facility];  Service: Cardiovascular;  Laterality: N/A;  . Tympanoplasty Left 11/2008    Left ear/Dr. Tami Ribas  . Aorta-iliac-femoral bypass  02/2012    Aorta-left iliac bypass with iliac right femoral bypass. Repair pseudoanuerysm. [facility] at [facility]  . Aorta-iliac-femoral bypass  1987  . Melanoma excision  05/25/2002    Chest wall  . Lad and rca stent  02/09/1999  . Peripheral vascular catheterization N/A 06/08/2014    Procedure: Dialysis/[place]  Catheter Removal;  Surgeon: Algernon Huxley, MD;  Location: [facility];  Service: Cardiovascular;  Laterality: N/A;  . Peripheral vascular catheterization N/A 10/06/2014    Procedure: A/V Shuntogram/Fistulagram;  Surgeon: Algernon Huxley, MD;  Location: [place] [facility];  Service: Cardiovascular;  Laterality: N/A;  . Peripheral vascular catheterization Left 10/06/2014    Procedure: A/V Shunt Intervention;  Surgeon: Algernon Huxley, MD;  Location: [place] [facility];  Service: Cardiovascular;  Laterality: Left;    Current Outpatient Rx  Name  Route  Sig  Dispense  Refill  . aspirin EC 81 MG tablet   Oral   Take 81 mg by mouth daily.         Marland Kitchen atorvastatin (LIPITOR) 80 MG tablet   Oral   Take 80 mg by mouth at bedtime.          . budesonide-formoterol (SYMBICORT) 160-4.5 MCG/ACT inhaler   Inhalation   Inhale 1 puff into the lungs 2 (two) times daily.   1 Inhaler   5   . carvedilol (COREG) 25 MG tablet   Oral   Take 2 tablets (50 mg total) by mouth 2 (two) times daily.   369 tablet   3   . colesevelam (WELCHOL) 625 MG tablet   Oral   Take 1,875 mg by mouth 2 (two) times daily with a meal.          . glipiZIDE (GLUCOTROL) 5 MG tablet   Oral   Take 1 tablet (5 mg total) by mouth daily before breakfast.   30 tablet   5   . isosorbide mononitrate (IMDUR) 60 MG 24 hr tablet   Oral   Take 60 mg by mouth every evening.          . lidocaine-prilocaine (EMLA) cream   Topical   Apply 1 application topically as needed (prior to accessing port).          . loratadine (CLARITIN) 10 MG tablet   Oral   Take 10 mg by mouth every evening.         . Multiple Vitamins-Minerals (MULTIVITAMIN WITH MINERALS) tablet   Oral   Take 1 tablet by mouth daily.         . nitroGLYCERIN (NITROSTAT) 0.4 MG SL tablet   Sublingual   Place 0.4 mg under the tongue every 5 (five) minutes as needed for chest pain.         . Ticagrelor (BRILINTA) 90 MG TABS tablet   Oral   Take 90  mg by mouth 2 (two) times daily.          . valsartan (DIOVAN) 160 MG tablet   Oral   Take 160 mg by mouth every evening.           Allergies Review of patient's allergies indicates no known allergies.  Family History  Problem Relation Age of Onset  . Cancer Mother     Lymphatic cancer  . Lung cancer Father   . Diabetes Brother     type  2  . Heart attack Brother     Social History Social History  Substance Use Topics  . Smoking status: Former Smoker -- 0.25 packs/day    Types: Cigarettes    Quit date: 05/17/2012  . Smokeless tobacco: Never Used  . Alcohol Use: No    Review of Systems  Constitutional: Negative for fever. Eyes: Negative for visual changes. ENT: Negative for sore throat. Cardiovascular: positivefor chest pain.no palpitations Respiratory: Negative for scough Gastrointestinal: Negative for abdominal pain, vomiting and diarrhea. Genitourinary: Negative for dysuria. Musculoskeletal: Negative for back pain. Skin: Negative for rash. Neurological: Negative for headache. 10 point Review of Systems otherwise negative ____________________________________________   PHYSICAL EXAM:  VITAL SIGNS: ED Triage Vitals  Enc Vitals Group     BP 12/02/14 2123 192/118 mmHg     Pulse Rate 12/02/14 2121 106     Resp 12/02/14 2121 22     Temp --      Temp src --      SpO2 12/02/14 2133 100 %     Weight 12/02/14 2121 170 lb 6 oz (77.282 kg)     Height --      Head Cir --      Peak Flow --      Pain Score 12/02/14 2122 9     Pain Loc --      Pain Edu? --      Excl. in [place]? --      Constitutional: Alert and oriented.in moderate distress due to pain.positive diaphoretic Eyes: Conjunctivae are normal. PERRL. Normal extraocular movements. ENT   Head: Normocephalic and atraumatic.   Nose: No congestion/rhinnorhea.   Mouth/Throat: Mucous membranes are moist.   Neck: No stridor. Cardiovascular/Chest: Normal rate, regular rhythm.  No murmurs, rubs,  or gallops. Respiratory: Normal respiratory effort without tachypnea nor retractions. Breath sounds are clear and equal bilaterally. No wheezes/rales/rhonchi. Gastrointestinal: Soft. No distention, no guarding, no rebound. Nontender   Genitourinary/rectal:Deferred Musculoskeletal: Nontender with normal range of motion in all extremities. No joint effusions.  No lower extremity tenderness.  No edema. Neurologic:  Normal speech and language. No gross or focal neurologic deficits are appreciated. Skin:  Skin is warm, diaphoreticand intact. No rash noted. Psychiatric: Mood and affect are normal. Speech and behavior are normal. Patient exhibits appropriate insight and judgment.  ____________________________________________   EKG I, Lisa Roca, MD, the attending physician have personally viewed and interpreted all ECGs.  113 bpm. Sinus rhythm with first-degree AV block. Right bundle branch block. Left axis deviation. Nonspecific ST and T-wave. ST segment minimally elevated in V4, isolated ____________________________________________  LABS (pertinent positives/negatives)  White blood count 50.3 with left shift. Hemoglobin 13.0 and platelet count 2:30 Comprehensive metabolic panel significant for BUN 64 and creatinine 6.74, potassium 6.1 Troponin 0.05  ____________________________________________  RADIOLOGY All Xrays were viewed by me. Imaging interpreted by Radiologist.  Chest x-ray 1 view:   IMPRESSION: Mild cardiomegaly and interstitial edema, suspect mild CHF; superimposed on background chronic change. __________________________________________  PROCEDURES  Procedure(s) performed: None  Critical Care performed: CRITICAL CARE Performed by: Lisa Roca   Total critical care time: 30 minutes  Critical care time was exclusive of separately billable procedures and treating other patients.  Critical care was necessary to treat or prevent imminent or life-threatening  deterioration.  Critical care was time spent personally by me on the following activities: development of treatment plan with patient and/or surrogate as well as nursing, discussions with consultants, evaluation of patient's response to treatment, examination of patient, obtaining  history from patient or surrogate, ordering and performing treatments and interventions, ordering and review of laboratory studies, ordering and review of radiographic studies, pulse oximetry and re-evaluation of patient's condition.   ____________________________________________   ED COURSE / ASSESSMENT AND PLAN  CONSULTATIONS: Dr. Holley Raring, nephrology -- recommends no emergency dialysis at this point --- will follow and dialyse tomorrow, consulted hospitalist for admission  Pertinent labs & imaging results that were available during my care of the patient were reviewed by me and considered in my medical decision making (see chart for details).  Patient arrived with gray color, and active bleed diaphoretic with chest pain, concerning for ACS is a patient with a history of prior MI that felt like this.  His initial blood pressure was severely elevated, and he did receive some symptomatic relief of his chest discomfort with nitroglycerin, which was also associated with improvement in blood pressure.  No hypoxia.  Chest x-ray showing possible mild CHF, but no wide mediastinum.  Once sublingual nitroglycerin would wear off, his blood pressure did spike back up and he expressed chest pain again. After several doses of sublingual nitroglycerin, patient was changed to a nitroglycerin drip.  His EKG is nonspecific, and his troponin did come back elevated 0.05. He does have renal failure, and a history of minimally elevated troponin, and so I'm unclear whether or not this is the beginning of an STEMI, or baseline minimal elevated troponin. Given his clinical presentation, patient was placed on nitroglycerin drip for hypertensive  emergency and possible ACS, as well as heparin bolus and drip for possible ACS.  His BUN/creatinine are somewhat elevated and his potassium was 6.1. Patient was given iv bicarbonate, glucose, insulin and calcium.  I discussed this case with the on-call nephrologist, and given that the patient has no hypoxia, and he had dialysis yesterday, and the 6.1 may be still related to hemolysis, patient will not go for emergency dialysis. He will be admitted to the hospital and likely have dialysis tomorrow.   ----------------------------------------- 11:23 PM on 12/02/2014 -----------------------------------------  [facility] Patient and family preferred to be transferred to [facility], where his cardiologist is located, Dr. Denman George.  Spoke with Dr. Wendee Beavers, accepted in transfer to Dr. Nils Pyle to step down.  Recommended coreg po dose.  Patient to be transported by [facility] critical care transport.   Patient / Family / Caregiver informed of clinical course, medical decision-making process, and agree with plan.    ___________________________________________   FINAL CLINICAL IMPRESSION(S) / ED DIAGNOSES   Final diagnoses:  Chest pain, unspecified chest pain type  Troponin I above reference range  Hyperkalemia  Hypertensive emergency       Lisa Roca, MD 12/02/14 [id number]  Lisa Roca, MD 12/02/14 2324

## 2014-12-02 NOTE — ED Notes (Signed)
Nitroglycerin 50 mg in 5% dextrose at 10 mcg/min

## 2014-12-02 NOTE — ED Notes (Signed)
Pt with central chest pain that began approx 30 min pta. Pt grey, diaphoretic.

## 2014-12-03 DIAGNOSIS — R079 Chest pain, unspecified: Secondary | ICD-10-CM | POA: Diagnosis present

## 2014-12-03 DIAGNOSIS — Z87891 Personal history of nicotine dependence: Secondary | ICD-10-CM | POA: Diagnosis not present

## 2014-12-03 DIAGNOSIS — I120 Hypertensive chronic kidney disease with stage 5 chronic kidney disease or end stage renal disease: Secondary | ICD-10-CM | POA: Diagnosis not present

## 2014-12-03 DIAGNOSIS — E875 Hyperkalemia: Secondary | ICD-10-CM | POA: Diagnosis not present

## 2014-12-03 DIAGNOSIS — Z7951 Long term (current) use of inhaled steroids: Secondary | ICD-10-CM | POA: Diagnosis not present

## 2014-12-03 DIAGNOSIS — N186 End stage renal disease: Secondary | ICD-10-CM | POA: Diagnosis not present

## 2014-12-03 DIAGNOSIS — I161 Hypertensive emergency: Secondary | ICD-10-CM | POA: Diagnosis not present

## 2014-12-03 DIAGNOSIS — I252 Old myocardial infarction: Secondary | ICD-10-CM | POA: Diagnosis not present

## 2014-12-03 DIAGNOSIS — Z79899 Other long term (current) drug therapy: Secondary | ICD-10-CM | POA: Diagnosis not present

## 2014-12-03 DIAGNOSIS — E1121 Type 2 diabetes mellitus with diabetic nephropathy: Secondary | ICD-10-CM | POA: Diagnosis not present

## 2014-12-03 DIAGNOSIS — Z992 Dependence on renal dialysis: Secondary | ICD-10-CM | POA: Diagnosis not present

## 2014-12-03 DIAGNOSIS — E119 Type 2 diabetes mellitus without complications: Secondary | ICD-10-CM | POA: Diagnosis not present

## 2014-12-03 MED ORDER — HEPARIN SODIUM (PORCINE) 5000 UNIT/ML IJ SOLN
INTRAMUSCULAR | Status: AC
  Filled 2014-12-03: qty 1

## 2014-12-03 MED ORDER — HEPARIN BOLUS VIA INFUSION
4000.0000 [IU] | Freq: Once | INTRAVENOUS | Status: AC
  Administered 2014-12-03: 4000 [IU] via INTRAVENOUS
  Filled 2014-12-03: qty 4000

## 2014-12-03 MED ORDER — HEPARIN (PORCINE) IN NACL 100-0.45 UNIT/ML-% IJ SOLN
900.0000 [IU]/h | INTRAMUSCULAR | Status: DC
  Administered 2014-12-03: 900 [IU]/h via INTRAVENOUS
  Filled 2014-12-03: qty 250

## 2014-12-03 NOTE — Progress Notes (Signed)
ANTICOAGULATION CONSULT NOTE - Initial Consult  Pharmacy Consult for heparin drip Indication: chest pain/ACS  No Known Allergies  Patient Measurements: Weight: 170 lb 6 oz (77.282 kg) Heparin Dosing Weight: 77kg  Vital Signs: Temp: 97.9 F (36.6 C) (11/19 0038) BP: 147/83 mmHg (11/19 0045) Pulse Rate: 93 (11/19 0045)  Labs:  Recent Labs  12/02/14 2134  HGB 13.0  HCT 39.3*  PLT 213  APTT 31  LABPROT 13.3  INR 0.99  CREATININE 6.74*  TROPONINI 0.05*    Estimated Creatinine Clearance: 9.3 mL/min (by C-G formula based on Cr of 6.74).   Medical History: Past Medical History  Diagnosis Date  . Diabetes ([place])   . Heart problem   . HBP (high blood pressure)   . High cholesterol   . Coronary artery disease   . Myocardial infarction ([place])   . Anginal pain ([place])   . CHF (congestive heart failure) ([place])   . GERD (gastroesophageal reflux disease)   . Chicken pox   . Measles   . Mumps   . History of MI (myocardial infarction)   . Renal artery stenosis ([place])   . Peripheral arterial disease ([place])   . Allergic rhinitis   . History of malignant melanoma   . Eczema   . History of recurrent UTIs     Medications:    Assessment: HGb13.0  plt 213 INR 0.99 aPTT 31  Goal of Therapy:  Heparin level 0.3-0.7 units/ml Monitor platelets by anticoagulation protocol: Yes   Plan:  4000 unit bolus and initial rate of 900 units/hr. First anti-Xa 8 hours after start of infusion.  Patient transferred out 11/19.  Nelissa Bolduc S 12/03/2014,1:49 AM

## 2014-12-14 ENCOUNTER — Encounter: Payer: Self-pay | Admitting: Family Medicine

## 2014-12-14 ENCOUNTER — Ambulatory Visit (INDEPENDENT_AMBULATORY_CARE_PROVIDER_SITE_OTHER): Payer: Commercial Managed Care - HMO | Admitting: Family Medicine

## 2014-12-14 VITALS — BP 120/58 | HR 66 | Temp 97.9°F | Resp 16 | Wt 172.0 lb

## 2014-12-14 DIAGNOSIS — N186 End stage renal disease: Secondary | ICD-10-CM

## 2014-12-14 DIAGNOSIS — E1121 Type 2 diabetes mellitus with diabetic nephropathy: Secondary | ICD-10-CM

## 2014-12-14 DIAGNOSIS — I25118 Atherosclerotic heart disease of native coronary artery with other forms of angina pectoris: Secondary | ICD-10-CM | POA: Diagnosis not present

## 2014-12-14 MED ORDER — GLIPIZIDE 5 MG PO TABS: 5.0000 mg | ORAL_TABLET | Freq: Every day | ORAL | Status: DC

## 2014-12-14 NOTE — Progress Notes (Signed)
Patient: John Robinson Male    DOB: 06/02/1932   82 y.o.   MRN: [id number] Visit Date: 12/14/2014  Today's Provider: Lelon Huh, MD   Chief Complaint  Patient presents with  . Hospitalization Follow-up   Subjective:    HPI   Follow up Hospitalization  Patient was admitted to [facility] on 12/02/2014 and discharged on 12/06/2014 with chest pain and malignant hypertension and ruled in for STEMI  He had cardiac stent place. States he has felt mostly well since discharge. Has had no more chest pain and breathing has been pretty good. He states his blood pressure gets really low during dialysis so he has not been taking Carvedilol regularly. He has also cut back to 1/2 of 160mg  Diovan due to low pressure. His SBP at home has been mostly in the low 100s, but sometimes in low 90s and occasionally in 170s since discharge.   ----------------------------------------------------------------------  Chest congestion Complains of constant feeling of congestion and mucous in chest and back of throat. He has been taking OTC loratadine which hasn't really helped. He has been using Symbicort regularly which has really helped his breathing, but not helping with chest congestion. He is not coughing up any sputum and does not feel short of breath.   No Known Allergies Previous Medications   ASPIRIN EC 81 MG TABLET    Take 81 mg by mouth daily.   ATORVASTATIN (LIPITOR) 80 MG TABLET    Take 80 mg by mouth at bedtime.    BUDESONIDE-FORMOTEROL (SYMBICORT) 160-4.5 MCG/ACT INHALER    Inhale 1 puff into the lungs 2 (two) times daily.   CARVEDILOL (COREG) 25 MG TABLET    Take 2 tablets (50 mg total) by mouth 2 (two) times daily.   COLESEVELAM (WELCHOL) 625 MG TABLET    Take 1,875 mg by mouth 2 (two) times daily with a meal.    GLIPIZIDE (GLUCOTROL) 5 MG TABLET    Take 1 tablet (5 mg total) by mouth daily before breakfast.   ISOSORBIDE MONONITRATE (IMDUR) 60 MG 24 HR TABLET    Take 60 mg by mouth every  evening.    LIDOCAINE-PRILOCAINE (EMLA) CREAM    Apply 1 application topically as needed (prior to accessing port).    LORATADINE (CLARITIN) 10 MG TABLET    Take 10 mg by mouth every evening.   MULTIPLE VITAMINS-MINERALS (MULTIVITAMIN WITH MINERALS) TABLET    Take 1 tablet by mouth daily.   NITROGLYCERIN (NITROSTAT) 0.4 MG SL TABLET    Place 0.4 mg under the tongue every 5 (five) minutes as needed for chest pain.   TICAGRELOR (BRILINTA) 90 MG TABS TABLET    Take 90 mg by mouth 2 (two) times daily.    VALSARTAN (DIOVAN) 160 MG TABLET    Take 160 mg by mouth every evening.    Review of Systems  Constitutional: Negative for fever, chills and appetite change.  Respiratory: Negative for chest tightness, shortness of breath and wheezing.   Cardiovascular: Negative for chest pain, palpitations and leg swelling.  Gastrointestinal: Negative for nausea, vomiting and abdominal pain.  Neurological: Negative for dizziness and light-headedness.    Social History  Substance Use Topics  . Smoking status: Former Smoker -- 0.25 packs/day    Types: Cigarettes    Quit date: 05/17/2012  . Smokeless tobacco: Never Used  . Alcohol Use: No   Objective:   BP 120/58 mmHg  Pulse 66  Temp(Src) 97.9 F (36.6 C) (Oral)  Resp  16  Wt 172 lb (78.019 kg)  Physical Exam   General Appearance:    Alert, cooperative, no distress  Eyes:    PERRL, conjunctiva/corneas clear, EOM's intact       Lungs:     Clear to auscultation bilaterally, respirations unlabored  Heart:    Regular rate and rhythm  Neurologic:   Awake, alert, oriented x 3. No apparent focal neurological           defect.          Assessment & Plan:     1. Coronary artery disease involving native coronary artery of native heart with other form of angina pectoris ([place]) No chest pain since discharge. He has follow up with cardiology scheduled in mid December.  His home blood pressures remain labile and states he feels horrible when BP drops  during dialysis. He has not been taking carvedilol consistently.Counseled on cardiac benefits of this medications. We agreed he could skip carvedilol in the morning on days he has dialysis unless his BP is elevated. He is to  take evening dose if SBP >100. Continue 1/2 x 160 valsartan. Should take other half if BP >140/90.   2. End stage kidney disease ([place]) Continue twice a week dialysis.   3. Diabetes mellitus with nephropathy ([place]) Lab Results  Component Value Date   HGBA1C 5.9 06/28/2014    Doing well on glipizide. Return in 6-8 weeks to check A1c.  - glipiZIDE (GLUCOTROL) 5 MG tablet; Take 1 tablet (5 mg total) by mouth daily before breakfast.  Dispense: 30 tablet; Refill: [place], MD  [facility]

## 2014-12-14 NOTE — Patient Instructions (Signed)
You may skip the morning dose of carvedilol on the days you have dialysis, unless your blood pressure is over 140/90 You may skip blood pressure medication if you blood pressure <100/50. OTC Mucinex (guaifensen) to help break up chest congestion

## 2014-12-20 ENCOUNTER — Encounter: Payer: Self-pay | Admitting: Family Medicine

## 2014-12-27 ENCOUNTER — Ambulatory Visit: Payer: Commercial Managed Care - HMO | Admitting: Family Medicine

## 2015-01-03 ENCOUNTER — Telehealth: Payer: Self-pay | Admitting: Family Medicine

## 2015-01-03 NOTE — Telephone Encounter (Signed)
Please advise. Thanks.  

## 2015-01-03 NOTE — Telephone Encounter (Signed)
His case manager spoke with his wife today.  She seemed to be confused as to when to give his medications for blood pressure.  He is on dialysis and she does not know when to hold and when to give them.  Please advise.  [phone]  Hoyt Koch

## 2015-01-11 ENCOUNTER — Ambulatory Visit (INDEPENDENT_AMBULATORY_CARE_PROVIDER_SITE_OTHER): Payer: Commercial Managed Care - HMO | Admitting: Family Medicine

## 2015-01-11 ENCOUNTER — Encounter: Payer: Self-pay | Admitting: Family Medicine

## 2015-01-11 VITALS — BP 120/64 | HR 83 | Temp 98.8°F | Resp 16 | Wt 172.0 lb

## 2015-01-11 DIAGNOSIS — J40 Bronchitis, not specified as acute or chronic: Secondary | ICD-10-CM

## 2015-01-11 MED ORDER — CEFDINIR 300 MG PO CAPS: 300.0000 mg | ORAL_CAPSULE | Freq: Two times a day (BID) | ORAL | Status: AC

## 2015-01-11 NOTE — Progress Notes (Signed)
Patient: John Robinson Male    DOB: 1932-02-08   82 y.o.   MRN: [id number] Visit Date: 01/11/2015  Today's Provider: Lelon Huh, MD   Chief Complaint  Patient presents with  . Cough   Subjective:    Cough This is a new problem. The current episode started in the past 7 days (1 week). The problem has been gradually worsening. The problem occurs hourly. The cough is productive of sputum. Associated symptoms include headaches, nasal congestion, postnasal drip, rhinorrhea and wheezing. Pertinent negatives include no chest pain, chills, ear congestion, ear pain, fever, heartburn, hemoptysis, myalgias, sore throat or shortness of breath. The symptoms are aggravated by lying down. Treatments tried: cough syrup. The treatment provided mild relief.    Cough started 1 week ago. Headaches, cough, no fever.  No Known Allergies Previous Medications   ASPIRIN EC 81 MG TABLET    Take 81 mg by mouth daily.   ATORVASTATIN (LIPITOR) 80 MG TABLET    Take 80 mg by mouth at bedtime.    BUDESONIDE-FORMOTEROL (SYMBICORT) 160-4.5 MCG/ACT INHALER    Inhale 1 puff into the lungs 2 (two) times daily.   CARVEDILOL (COREG) 25 MG TABLET    Take 2 tablets (50 mg total) by mouth 2 (two) times daily.   COLESEVELAM (WELCHOL) 625 MG TABLET    Take 1,875 mg by mouth 2 (two) times daily with a meal.    GLIPIZIDE (GLUCOTROL) 5 MG TABLET    Take 1 tablet (5 mg total) by mouth daily before breakfast.   ISOSORBIDE MONONITRATE (IMDUR) 60 MG 24 HR TABLET    Take 60 mg by mouth every evening.    LIDOCAINE-PRILOCAINE (EMLA) CREAM    Apply 1 application topically as needed (prior to accessing port).    LORATADINE (CLARITIN) 10 MG TABLET    Take 10 mg by mouth every evening.   MULTIPLE VITAMINS-MINERALS (MULTIVITAMIN WITH MINERALS) TABLET    Take 1 tablet by mouth daily.   NITROGLYCERIN (NITROSTAT) 0.4 MG SL TABLET    Place 0.4 mg under the tongue every 5 (five) minutes as needed for chest pain.   OMEPRAZOLE  (PRILOSEC) 20 MG CAPSULE    Take 20 mg by mouth daily.   TICAGRELOR (BRILINTA) 90 MG TABS TABLET    Take 90 mg by mouth 2 (two) times daily.    VALSARTAN (DIOVAN) 160 MG TABLET    Take 80 mg by mouth every evening.     Review of Systems  Constitutional: Negative for fever, chills and appetite change.  HENT: Positive for congestion, postnasal drip and rhinorrhea. Negative for ear pain and sore throat.   Respiratory: Positive for cough and wheezing. Negative for hemoptysis, chest tightness and shortness of breath.   Cardiovascular: Negative for chest pain and palpitations.  Gastrointestinal: Negative for heartburn, nausea, vomiting and abdominal pain.  Musculoskeletal: Negative for myalgias.  Neurological: Positive for headaches.    Social History  Substance Use Topics  . Smoking status: Former Smoker -- 0.25 packs/day    Types: Cigarettes    Quit date: 05/17/2012  . Smokeless tobacco: Never Used  . Alcohol Use: No   Objective:   BP 120/64 mmHg  Pulse 83  Temp(Src) 98.8 F (37.1 C) (Oral)  Resp 16  Wt 172 lb (78.019 kg)  Physical Exam  General Appearance:    Alert, cooperative, no distress  HENT:   bilateral TM normal without fluid or infection, neck without nodes, sinuses nontender and nasal mucosa  pale and congested  Eyes:    PERRL, conjunctiva/corneas clear, EOM's intact       Lungs:     Mild bilateral expiratory wheeze, no rales respirations unlabored  Heart:    Regular rate and rhythm  Neurologic:   Awake, alert, oriented x 3. No apparent focal neurological           defect.           Assessment & Plan:     1. Bronchitis  - cefdinir (OMNICEF) 300 MG capsule; Take 1 capsule (300 mg total) by mouth 2 (two) times daily.  Dispense: 20 capsule; Refill: 0  Call if symptoms change or if not rapidly improving.          Lelon Huh, MD  [facility]

## 2015-01-31 ENCOUNTER — Ambulatory Visit: Payer: Commercial Managed Care - HMO | Admitting: Family Medicine

## 2015-02-01 ENCOUNTER — Encounter: Payer: Self-pay | Admitting: Family Medicine

## 2015-02-01 ENCOUNTER — Ambulatory Visit (INDEPENDENT_AMBULATORY_CARE_PROVIDER_SITE_OTHER): Payer: Commercial Managed Care - HMO | Admitting: Family Medicine

## 2015-02-01 VITALS — BP 140/80 | HR 81 | Temp 98.2°F | Resp 16 | Wt 175.0 lb

## 2015-02-01 DIAGNOSIS — E1121 Type 2 diabetes mellitus with diabetic nephropathy: Secondary | ICD-10-CM | POA: Diagnosis not present

## 2015-02-01 DIAGNOSIS — J449 Chronic obstructive pulmonary disease, unspecified: Secondary | ICD-10-CM | POA: Diagnosis not present

## 2015-02-01 DIAGNOSIS — J40 Bronchitis, not specified as acute or chronic: Secondary | ICD-10-CM

## 2015-02-01 LAB — POCT GLYCOSYLATED HEMOGLOBIN (HGB A1C)
ESTIMATED AVERAGE GLUCOSE: 137
HEMOGLOBIN A1C: 6.4

## 2015-02-01 MED ORDER — GLIPIZIDE 5 MG PO TABS: 5.0000 mg | ORAL_TABLET | Freq: Every day | ORAL | Status: AC

## 2015-02-01 MED ORDER — CEFDINIR 300 MG PO CAPS: 600.0000 mg | ORAL_CAPSULE | Freq: Two times a day (BID) | ORAL | Status: AC

## 2015-02-01 NOTE — Progress Notes (Signed)
Patient: John Robinson Male    DOB: 04-24-1932   82 y.o.   MRN: [id number] Visit Date: 02/01/2015  Today's Provider: Lelon Huh, MD   Chief Complaint  Patient presents with  . Follow-up  . Diabetes  . Coronary Artery Disease  . Chronic Kidney Disease   Subjective:    HPI  Follow-up for CAD from 12/14/2014; Patient given ok to stop carvedilol in the morning, on the days he has dialysis unless his Bp is elevated. Follow-up for end stage kidney disease form 12/14/2014; no changes, continue twice a week dialysis.    Diabetes Mellitus Type II, Follow-up:   Lab Results  Component Value Date   HGBA1C 5.9 06/28/2014   HGBA1C 6.3 03/18/2014   HGBA1C 6.4 02/02/2014   Last seen for diabetes 2 months ago.  Management since then includes; no changes. He reports good compliance with treatment. He is not having side effects. none Current symptoms include none and have been stable. Home blood sugar records: fasting range: 110  Episodes of hypoglycemia? no   Current Insulin Regimen: n/a  Weight trend: stable Prior visit with dietician: no Current diet: well balanced Current exercise: none  ----------------------------------------------------------------------   He was seen in December for bronchitis and treated with 10 days of cefdinir. His cough and breathing improved greatly, but he is still having more wheezing at night than usual. He is using Symbicort consistently.   No Known Allergies Previous Medications   ASPIRIN EC 81 MG TABLET    Take 81 mg by mouth daily.   ATORVASTATIN (LIPITOR) 80 MG TABLET    Take 80 mg by mouth at bedtime.    BUDESONIDE-FORMOTEROL (SYMBICORT) 160-4.5 MCG/ACT INHALER    Inhale 1 puff into the lungs 2 (two) times daily.   CARVEDILOL (COREG) 25 MG TABLET    Take 2 tablets (50 mg total) by mouth 2 (two) times daily.   COLESEVELAM (WELCHOL) 625 MG TABLET    Take 1,875 mg by mouth 2 (two) times daily with a meal.    GLIPIZIDE  (GLUCOTROL) 5 MG TABLET    Take 1 tablet (5 mg total) by mouth daily before breakfast.   ISOSORBIDE MONONITRATE (IMDUR) 60 MG 24 HR TABLET    Take 60 mg by mouth every evening.    LIDOCAINE-PRILOCAINE (EMLA) CREAM    Apply 1 application topically as needed (prior to accessing port).    LORATADINE (CLARITIN) 10 MG TABLET    Take 10 mg by mouth every evening.   MULTIPLE VITAMINS-MINERALS (MULTIVITAMIN WITH MINERALS) TABLET    Take 1 tablet by mouth daily.   NITROGLYCERIN (NITROSTAT) 0.4 MG SL TABLET    Place 0.4 mg under the tongue every 5 (five) minutes as needed for chest pain.   OMEPRAZOLE (PRILOSEC) 20 MG CAPSULE    Take 20 mg by mouth daily.   TICAGRELOR (BRILINTA) 90 MG TABS TABLET    Take 90 mg by mouth 2 (two) times daily.    VALSARTAN (DIOVAN) 160 MG TABLET    Take 80 mg by mouth every evening.     Review of Systems  Constitutional: Negative for fever, chills and appetite change.  Respiratory: Positive for cough and wheezing. Negative for chest tightness and shortness of breath.   Cardiovascular: Negative for chest pain and palpitations.  Gastrointestinal: Negative for nausea, vomiting and abdominal pain.    Social History  Substance Use Topics  . Smoking status: Former Smoker -- 0.25 packs/day    Types: Cigarettes  Quit date: 05/17/2012  . Smokeless tobacco: Never Used  . Alcohol Use: No   Objective:   BP 140/80 mmHg  Pulse 81  Temp(Src) 98.2 F (36.8 C) (Oral)  Resp 16  Wt 175 lb (79.379 kg)  SpO2 99%  Physical Exam   General Appearance:    Alert, cooperative, no distress  Eyes:    PERRL, conjunctiva/corneas clear, EOM's intact       Lungs:     Occasional expiratory wheezes, no rales respirations unlabored  Heart:    Regular rate and rhythm  Neurologic:   Awake, alert, oriented x 3. No apparent focal neurological           defect.       Results for orders placed or performed in visit on 02/01/15  POCT glycosylated hemoglobin (Hb A1C)  Result Value Ref Range     Hemoglobin A1C 6.4    Est. average glucose Bld gHb Est-mCnc 137         Assessment & Plan:      1. Diabetes mellitus with nephropathy ([place]) Well controlled.  Continue current medications.   - POCT glycosylated hemoglobin (Hb A1C) - glipiZIDE (GLUCOTROL) 5 MG tablet; Take 1 tablet (5 mg total) by mouth daily before breakfast.  Dispense: 90 tablet; Refill: 4  2. Chronic obstructive pulmonary disease, unspecified COPD type (HCC) Continue Symbicort inhaler  3. Bronchitis Partially cleared, start back on cefdinir for another 10 days and call if not rapidly improving.  - cefdinir (OMNICEF) 300 MG capsule; Take 2 capsules (600 mg total) by mouth 2 (two) times daily.  Dispense: 20 capsule; Refill: 0  Follow up 3-4 months.        Lelon Huh, MD  [facility]

## 2015-02-13 ENCOUNTER — Other Ambulatory Visit: Payer: Self-pay | Admitting: Family Medicine

## 2015-02-20 ENCOUNTER — Other Ambulatory Visit: Payer: Self-pay | Admitting: Family Medicine

## 2015-03-21 ENCOUNTER — Encounter: Payer: Self-pay | Admitting: *Deleted

## 2015-03-21 ENCOUNTER — Other Ambulatory Visit: Payer: Self-pay | Admitting: *Deleted

## 2015-03-21 NOTE — Patient Outreach (Addendum)
[facility]) Care Management  03/21/2015  John Robinson 22-Jul-1932 [id number]   Subjective: Telephone call to patient's home number, spoke with patient, and HIPAA verified.   Discussed [facility] Care Management services and patient declined services.   States he does need any assistance at this time and was in agreement to receive [facility] information for future reference.   States he will call [facility] Management if anything changes.   Objective:  Per Epic case review: Patient had refused [place] Management services on 08/16/14.    Assessment: Received [facility] HMO Tier 4 list referral on 03/07/15.   Diagnosis: Diabetes and COPD.   6 ED visits and no admits.  Patient refused [place] Management services.   Plan: RNCM will send case closure letter due to refusal to patient's primary MD. [facility] will send request for case closure due to refusal/ no case management needs to [place] [facility] [place] [facility]. RNCM will send patient successful outreach letter, magnet and pamphlet.    Demonte Dobratz H. Annia Friendly, BSN, [place] [facility] Telephonic CM Phone: [phone] Fax: [phone]

## 2015-04-03 HISTORY — PX: CORONARY ARTERY BYPASS GRAFT: SHX141

## 2015-04-07 ENCOUNTER — Encounter: Payer: Self-pay | Admitting: Family Medicine

## 2015-04-14 ENCOUNTER — Encounter: Payer: Self-pay | Admitting: Family Medicine

## 2015-04-19 ENCOUNTER — Encounter: Payer: Self-pay | Admitting: Family Medicine

## 2015-05-08 ENCOUNTER — Ambulatory Visit: Payer: Commercial Managed Care - HMO | Admitting: Family Medicine

## 2015-05-08 ENCOUNTER — Telehealth: Payer: Self-pay | Admitting: Family Medicine

## 2015-05-08 NOTE — Telephone Encounter (Signed)
Pt wife called saying her husband is at [facility].  He has had a triple bypass. He has been in there for 6 weeks Thursday.  Trying to start some rehab.  Just wanted to let you know what is going on.   Thanks Con Memos

## 2015-05-23 ENCOUNTER — Telehealth: Payer: Self-pay | Admitting: Family Medicine

## 2015-05-23 NOTE — Telephone Encounter (Signed)
Pt is being discharged from [facility] today for chest pain. I have requested they fax records.  I have scheduled a hospital follow up appointment/MW

## 2015-05-31 ENCOUNTER — Ambulatory Visit: Payer: Commercial Managed Care - HMO | Admitting: Family Medicine

## 2015-06-06 ENCOUNTER — Encounter: Payer: Self-pay | Admitting: Family Medicine

## 2015-07-07 ENCOUNTER — Encounter: Payer: Self-pay | Admitting: Family Medicine

## 2015-07-07 ENCOUNTER — Ambulatory Visit (INDEPENDENT_AMBULATORY_CARE_PROVIDER_SITE_OTHER): Payer: Commercial Managed Care - HMO | Admitting: Family Medicine

## 2015-07-07 VITALS — BP 100/50 | HR 97 | Temp 99.0°F | Resp 16

## 2015-07-07 DIAGNOSIS — I25118 Atherosclerotic heart disease of native coronary artery with other forms of angina pectoris: Secondary | ICD-10-CM | POA: Diagnosis not present

## 2015-07-07 DIAGNOSIS — R05 Cough: Secondary | ICD-10-CM | POA: Diagnosis not present

## 2015-07-07 DIAGNOSIS — R059 Cough, unspecified: Secondary | ICD-10-CM

## 2015-07-07 LAB — POCT INR
INR: 1.9
PT: 23.1

## 2015-07-07 MED ORDER — HYDROCODONE-HOMATROPINE 5-1.5 MG/5ML PO SYRP: 5.0000 mL | ORAL_SOLUTION | Freq: Three times a day (TID) | ORAL | Status: DC | PRN

## 2015-07-07 MED ORDER — HYDROCODONE-HOMATROPINE 5-1.5 MG/5ML PO SYRP: 5.0000 mL | ORAL_SOLUTION | Freq: Three times a day (TID) | ORAL | Status: AC | PRN

## 2015-07-07 MED ORDER — CEFDINIR 300 MG PO CAPS: 300.0000 mg | ORAL_CAPSULE | Freq: Two times a day (BID) | ORAL | Status: AC

## 2015-07-07 NOTE — Progress Notes (Signed)
Patient: John Robinson Male    DOB: 01-15-1932   82 y.o.   MRN: [id number] Visit Date: 07/07/2015  Today's Provider: Lelon Huh, MD   Chief Complaint  Patient presents with  . Hospitalization Follow-up   Subjective:    HPI   Follow up Hospitalization  Patient was admitted to [facility] on 03/30/2015 and discharged on 05/23/2015.  Treatment for this included: patient underwent CABG on [id number] with complicated post operative course, eventually discharge to [facility] May 9th.  During hospitalization he was taken off of Brillinta and started on coumadin for atrial fib/flutter.  Patient has been discharged to home this week.  He had follow up at [place] on 07/05/2015 PT/INR was checked 1.49. Patient was told to increased dose to 5 mg qd of coumadin and recheck 2 days with pcp. He reports developing persistent cough productive clear phlegm and nasal congestion with clear drainage over the last week. He had chest xray at [facility] on 07-05-2015 showing stable bibasilar opacities thought to represent residual infection or atelectasis. He has had no fever, but is a little short of breath and still feels very fatigued.  He reports good compliance with treatment. He reports this condition is Unchanged.    ----------------------------------------------------------------------    No Known Allergies Current Meds  Medication Sig  . ACCU-CHEK AVIVA PLUS test strip TEST DAILY  . amLODipine (NORVASC) 10 MG tablet Take 10 mg by mouth daily.  Marland Kitchen aspirin EC 81 MG tablet Take 81 mg by mouth daily.  Marland Kitchen atorvastatin (LIPITOR) 80 MG tablet Take 80 mg by mouth at bedtime.   . budesonide-formoterol (SYMBICORT) 160-4.5 MCG/ACT inhaler Inhale 1 puff into the lungs 2 (two) times daily.  . Cholecalciferol (VITAMIN D-1000 MAX ST) 1000 units tablet Take 4 tablets by mouth daily.  . colesevelam (WELCHOL) 625 MG tablet Take 1,875 mg by mouth 2 (two) times daily with a meal.   . glipiZIDE  (GLUCOTROL) 5 MG tablet Take 1 tablet (5 mg total) by mouth daily before breakfast.  . hydrALAZINE (APRESOLINE) 25 MG tablet Take 25 mg by mouth.  . lidocaine-prilocaine (EMLA) cream Apply 1 application topically as needed (prior to accessing port).   Marland Kitchen lisinopril (PRINIVIL,ZESTRIL) 40 MG tablet Take 40 mg by mouth.  . loratadine (CLARITIN) 10 MG tablet Take 10 mg by mouth every evening.  . Multiple Vitamins-Minerals (MULTIVITAMIN WITH MINERALS) tablet Take 1 tablet by mouth daily.  . nitroGLYCERIN (NITROSTAT) 0.4 MG SL tablet DISSOLVE 1 TABLET UNDER THE TONGUE EVERY 5 MINUTES AS NEEDED FOR CHEST PAIN; GO TO E.R. IF NO RELIEF AFTER 3 DOSES  . omeprazole (PRILOSEC) 20 MG capsule Take 20 mg by mouth daily.  . polyethylene glycol (MIRALAX / GLYCOLAX) packet Take by mouth.  . traZODone (DESYREL) 50 MG tablet Take 50 mg by mouth.  . [DISCONTINUED] isosorbide mononitrate (IMDUR) 60 MG 24 hr tablet Take 60 mg by mouth every evening.   . [DISCONTINUED] Ticagrelor (BRILINTA) 90 MG TABS tablet Take 90 mg by mouth 2 (two) times daily.   . [DISCONTINUED] warfarin (COUMADIN) 1 MG tablet Take 5 mg by mouth daily.     Review of Systems  Constitutional: Positive for fatigue. Negative for fever, chills and diaphoresis.  HENT: Positive for congestion. Negative for ear pain.   Respiratory: Positive for cough and shortness of breath. Negative for apnea, choking, chest tightness and wheezing.   Cardiovascular: Negative for chest pain, palpitations and leg swelling.  Gastrointestinal: Negative for nausea,  vomiting and abdominal pain.  Neurological: Positive for tremors.    Social History  Substance Use Topics  . Smoking status: Former Smoker -- 0.25 packs/day    Types: Cigarettes    Quit date: 05/17/2012  . Smokeless tobacco: Never Used  . Alcohol Use: No   Objective:   BP 100/50 mmHg  Pulse 97  Temp(Src) 99 F (37.2 C) (Oral)  Resp 16  SpO2 97%  Physical Exam  General Appearance:    Alert,  cooperative, no distress  HENT:   bilateral TM normal without fluid or infection, neck without nodes, throat normal without erythema or exudate, sinuses nontender, nasal mucosa congested and nasal mucosa pale and congested  Eyes:    PERRL, conjunctiva/corneas clear, EOM's intact       Lungs:     Clear to auscultation bilaterally, respirations unlabored  Heart:    Regular rate and rhythm  Neurologic:   Awake, alert, oriented x 3. No apparent focal neurological           defect.       Results for orders placed or performed in visit on 07/07/15  POCT INR  Result Value Ref Range   INR 1.9    PT 23.1         Assessment & Plan:      1. Coronary artery disease involving native coronary artery of native heart with other form of angina pectoris ([place]) Only on current dose of warfarin for the last 2 days. Will continue 5mg  a day and recheck PT/INR in 10 days.  - POCT INR; Standing - POCT INR - warfarin (COUMADIN) 5 MG tablet; Take 5 mg by mouth at bedtime.   2. Cough Associated with viral URI symptoms, but chest XR at [facility] 2 days ago concerning for possible persistent infection. Start on cefdinir and hydrocodone as he is having  Considerable pain associated with cough.  - HYDROcodone-homatropine (HYCODAN) 5-1.5 MG/5ML syrup; Take 5 mLs by mouth every 8 (eight) hours as needed for cough.  Dispense: 120 mL; Refill: 0 - cefdinir (OMNICEF) 300 MG capsule; Take 1 capsule (300 mg total) by mouth 2 (two) times daily.  Dispense: 14 capsule; Refill: 0  Call or go to ER if symptoms change or if not rapidly improving.          Lelon Huh, MD  [facility]

## 2015-07-10 ENCOUNTER — Inpatient Hospital Stay
Admission: EM | Admit: 2015-07-10 | Discharge: 2015-08-15 | DRG: 871 | Disposition: E | Payer: Commercial Managed Care - HMO | Attending: Internal Medicine | Admitting: Internal Medicine

## 2015-07-10 ENCOUNTER — Encounter: Payer: Self-pay | Admitting: Emergency Medicine

## 2015-07-10 ENCOUNTER — Emergency Department: Payer: Commercial Managed Care - HMO

## 2015-07-10 ENCOUNTER — Telehealth: Payer: Self-pay | Admitting: Family Medicine

## 2015-07-10 DIAGNOSIS — Z87440 Personal history of urinary (tract) infections: Secondary | ICD-10-CM

## 2015-07-10 DIAGNOSIS — R1310 Dysphagia, unspecified: Secondary | ICD-10-CM | POA: Diagnosis present

## 2015-07-10 DIAGNOSIS — T8130XD Disruption of wound, unspecified, subsequent encounter: Secondary | ICD-10-CM | POA: Diagnosis not present

## 2015-07-10 DIAGNOSIS — Z87891 Personal history of nicotine dependence: Secondary | ICD-10-CM

## 2015-07-10 DIAGNOSIS — IMO0002 Reserved for concepts with insufficient information to code with codable children: Secondary | ICD-10-CM

## 2015-07-10 DIAGNOSIS — E43 Unspecified severe protein-calorie malnutrition: Secondary | ICD-10-CM | POA: Diagnosis present

## 2015-07-10 DIAGNOSIS — J9811 Atelectasis: Secondary | ICD-10-CM | POA: Diagnosis present

## 2015-07-10 DIAGNOSIS — G9341 Metabolic encephalopathy: Secondary | ICD-10-CM | POA: Diagnosis present

## 2015-07-10 DIAGNOSIS — I4892 Unspecified atrial flutter: Secondary | ICD-10-CM | POA: Diagnosis present

## 2015-07-10 DIAGNOSIS — E872 Acidosis: Secondary | ICD-10-CM | POA: Diagnosis present

## 2015-07-10 DIAGNOSIS — Z9889 Other specified postprocedural states: Secondary | ICD-10-CM

## 2015-07-10 DIAGNOSIS — J440 Chronic obstructive pulmonary disease with acute lower respiratory infection: Secondary | ICD-10-CM | POA: Diagnosis present

## 2015-07-10 DIAGNOSIS — R197 Diarrhea, unspecified: Secondary | ICD-10-CM | POA: Diagnosis not present

## 2015-07-10 DIAGNOSIS — Z992 Dependence on renal dialysis: Secondary | ICD-10-CM

## 2015-07-10 DIAGNOSIS — N2581 Secondary hyperparathyroidism of renal origin: Secondary | ICD-10-CM | POA: Diagnosis present

## 2015-07-10 DIAGNOSIS — J9601 Acute respiratory failure with hypoxia: Secondary | ICD-10-CM | POA: Diagnosis present

## 2015-07-10 DIAGNOSIS — I469 Cardiac arrest, cause unspecified: Secondary | ICD-10-CM | POA: Diagnosis not present

## 2015-07-10 DIAGNOSIS — R627 Adult failure to thrive: Secondary | ICD-10-CM | POA: Diagnosis not present

## 2015-07-10 DIAGNOSIS — J441 Chronic obstructive pulmonary disease with (acute) exacerbation: Secondary | ICD-10-CM | POA: Diagnosis present

## 2015-07-10 DIAGNOSIS — I739 Peripheral vascular disease, unspecified: Secondary | ICD-10-CM | POA: Diagnosis present

## 2015-07-10 DIAGNOSIS — Z515 Encounter for palliative care: Secondary | ICD-10-CM

## 2015-07-10 DIAGNOSIS — J690 Pneumonitis due to inhalation of food and vomit: Secondary | ICD-10-CM | POA: Diagnosis present

## 2015-07-10 DIAGNOSIS — Z8701 Personal history of pneumonia (recurrent): Secondary | ICD-10-CM | POA: Diagnosis not present

## 2015-07-10 DIAGNOSIS — A419 Sepsis, unspecified organism: Principal | ICD-10-CM | POA: Diagnosis present

## 2015-07-10 DIAGNOSIS — R140 Abdominal distension (gaseous): Secondary | ICD-10-CM

## 2015-07-10 DIAGNOSIS — I252 Old myocardial infarction: Secondary | ICD-10-CM | POA: Diagnosis not present

## 2015-07-10 DIAGNOSIS — R1110 Vomiting, unspecified: Secondary | ICD-10-CM

## 2015-07-10 DIAGNOSIS — E875 Hyperkalemia: Secondary | ICD-10-CM | POA: Diagnosis present

## 2015-07-10 DIAGNOSIS — I248 Other forms of acute ischemic heart disease: Secondary | ICD-10-CM | POA: Diagnosis present

## 2015-07-10 DIAGNOSIS — Z801 Family history of malignant neoplasm of trachea, bronchus and lung: Secondary | ICD-10-CM

## 2015-07-10 DIAGNOSIS — Z7189 Other specified counseling: Secondary | ICD-10-CM

## 2015-07-10 DIAGNOSIS — J9600 Acute respiratory failure, unspecified whether with hypoxia or hypercapnia: Secondary | ICD-10-CM

## 2015-07-10 DIAGNOSIS — Z66 Do not resuscitate: Secondary | ICD-10-CM | POA: Diagnosis present

## 2015-07-10 DIAGNOSIS — Z9049 Acquired absence of other specified parts of digestive tract: Secondary | ICD-10-CM

## 2015-07-10 DIAGNOSIS — T17900A Unspecified foreign body in respiratory tract, part unspecified causing asphyxiation, initial encounter: Secondary | ICD-10-CM | POA: Diagnosis not present

## 2015-07-10 DIAGNOSIS — Z8249 Family history of ischemic heart disease and other diseases of the circulatory system: Secondary | ICD-10-CM

## 2015-07-10 DIAGNOSIS — L89152 Pressure ulcer of sacral region, stage 2: Secondary | ICD-10-CM | POA: Diagnosis not present

## 2015-07-10 DIAGNOSIS — K567 Ileus, unspecified: Secondary | ICD-10-CM | POA: Diagnosis present

## 2015-07-10 DIAGNOSIS — I2510 Atherosclerotic heart disease of native coronary artery without angina pectoris: Secondary | ICD-10-CM | POA: Diagnosis present

## 2015-07-10 DIAGNOSIS — J029 Acute pharyngitis, unspecified: Secondary | ICD-10-CM | POA: Diagnosis present

## 2015-07-10 DIAGNOSIS — R230 Cyanosis: Secondary | ICD-10-CM | POA: Diagnosis present

## 2015-07-10 DIAGNOSIS — Z978 Presence of other specified devices: Secondary | ICD-10-CM

## 2015-07-10 DIAGNOSIS — A047 Enterocolitis due to Clostridium difficile: Secondary | ICD-10-CM | POA: Diagnosis present

## 2015-07-10 DIAGNOSIS — E1165 Type 2 diabetes mellitus with hyperglycemia: Secondary | ICD-10-CM | POA: Diagnosis present

## 2015-07-10 DIAGNOSIS — R6521 Severe sepsis with septic shock: Secondary | ICD-10-CM | POA: Diagnosis present

## 2015-07-10 DIAGNOSIS — I959 Hypotension, unspecified: Secondary | ICD-10-CM | POA: Diagnosis not present

## 2015-07-10 DIAGNOSIS — J189 Pneumonia, unspecified organism: Secondary | ICD-10-CM | POA: Diagnosis present

## 2015-07-10 DIAGNOSIS — R5381 Other malaise: Secondary | ICD-10-CM | POA: Diagnosis not present

## 2015-07-10 DIAGNOSIS — Z951 Presence of aortocoronary bypass graft: Secondary | ICD-10-CM

## 2015-07-10 DIAGNOSIS — Z79899 Other long term (current) drug therapy: Secondary | ICD-10-CM

## 2015-07-10 DIAGNOSIS — N186 End stage renal disease: Secondary | ICD-10-CM | POA: Diagnosis present

## 2015-07-10 DIAGNOSIS — Z85820 Personal history of malignant melanoma of skin: Secondary | ICD-10-CM

## 2015-07-10 DIAGNOSIS — R109 Unspecified abdominal pain: Secondary | ICD-10-CM

## 2015-07-10 DIAGNOSIS — Z6821 Body mass index (BMI) 21.0-21.9, adult: Secondary | ICD-10-CM

## 2015-07-10 DIAGNOSIS — Z452 Encounter for adjustment and management of vascular access device: Secondary | ICD-10-CM

## 2015-07-10 DIAGNOSIS — Z888 Allergy status to other drugs, medicaments and biological substances status: Secondary | ICD-10-CM

## 2015-07-10 DIAGNOSIS — T380X5A Adverse effect of glucocorticoids and synthetic analogues, initial encounter: Secondary | ICD-10-CM | POA: Diagnosis present

## 2015-07-10 DIAGNOSIS — K219 Gastro-esophageal reflux disease without esophagitis: Secondary | ICD-10-CM | POA: Diagnosis present

## 2015-07-10 DIAGNOSIS — R400 Somnolence: Secondary | ICD-10-CM | POA: Diagnosis not present

## 2015-07-10 DIAGNOSIS — R000 Tachycardia, unspecified: Secondary | ICD-10-CM | POA: Diagnosis present

## 2015-07-10 DIAGNOSIS — J9621 Acute and chronic respiratory failure with hypoxia: Secondary | ICD-10-CM | POA: Diagnosis not present

## 2015-07-10 DIAGNOSIS — E1122 Type 2 diabetes mellitus with diabetic chronic kidney disease: Secondary | ICD-10-CM | POA: Diagnosis present

## 2015-07-10 DIAGNOSIS — I120 Hypertensive chronic kidney disease with stage 5 chronic kidney disease or end stage renal disease: Secondary | ICD-10-CM | POA: Diagnosis present

## 2015-07-10 DIAGNOSIS — I4891 Unspecified atrial fibrillation: Secondary | ICD-10-CM | POA: Diagnosis present

## 2015-07-10 DIAGNOSIS — R6520 Severe sepsis without septic shock: Secondary | ICD-10-CM | POA: Diagnosis not present

## 2015-07-10 DIAGNOSIS — Z01818 Encounter for other preprocedural examination: Secondary | ICD-10-CM

## 2015-07-10 DIAGNOSIS — L89302 Pressure ulcer of unspecified buttock, stage 2: Secondary | ICD-10-CM | POA: Diagnosis present

## 2015-07-10 DIAGNOSIS — R0600 Dyspnea, unspecified: Secondary | ICD-10-CM

## 2015-07-10 DIAGNOSIS — Z7901 Long term (current) use of anticoagulants: Secondary | ICD-10-CM

## 2015-07-10 DIAGNOSIS — Z833 Family history of diabetes mellitus: Secondary | ICD-10-CM

## 2015-07-10 DIAGNOSIS — Z7982 Long term (current) use of aspirin: Secondary | ICD-10-CM

## 2015-07-10 DIAGNOSIS — B9689 Other specified bacterial agents as the cause of diseases classified elsewhere: Secondary | ICD-10-CM | POA: Diagnosis present

## 2015-07-10 DIAGNOSIS — Z955 Presence of coronary angioplasty implant and graft: Secondary | ICD-10-CM

## 2015-07-10 DIAGNOSIS — R0602 Shortness of breath: Secondary | ICD-10-CM

## 2015-07-10 DIAGNOSIS — Z4659 Encounter for fitting and adjustment of other gastrointestinal appliance and device: Secondary | ICD-10-CM

## 2015-07-10 DIAGNOSIS — D631 Anemia in chronic kidney disease: Secondary | ICD-10-CM | POA: Diagnosis present

## 2015-07-10 HISTORY — DX: Essential (primary) hypertension: I10

## 2015-07-10 HISTORY — DX: Disorder of kidney and ureter, unspecified: N28.9

## 2015-07-10 HISTORY — DX: Chronic kidney disease, unspecified: N18.9

## 2015-07-10 LAB — CBC WITH DIFFERENTIAL/PLATELET
BASOS ABS: 0 10*3/uL (ref 0–0.1)
Eosinophils Absolute: 0.2 10*3/uL (ref 0–0.7)
Eosinophils Relative: 1 %
HEMATOCRIT: 35.3 % — AB (ref 40.0–52.0)
HEMOGLOBIN: 11.8 g/dL — AB (ref 13.0–18.0)
Lymphs Abs: 2.2 10*3/uL (ref 1.0–3.6)
MCH: 28.5 pg (ref 26.0–34.0)
MCHC: 33.4 g/dL (ref 32.0–36.0)
MCV: 85.5 fL (ref 80.0–100.0)
MONO ABS: 1 10*3/uL (ref 0.2–1.0)
Monocytes Relative: 4 %
NEUTROS ABS: 21.2 10*3/uL — AB (ref 1.4–6.5)
Platelets: 269 10*3/uL (ref 150–440)
RBC: 4.13 MIL/uL — AB (ref 4.40–5.90)
RDW: 20.6 % — AB (ref 11.5–14.5)
WBC: 24.7 10*3/uL — AB (ref 3.8–10.6)

## 2015-07-10 LAB — LACTIC ACID, PLASMA
LACTIC ACID, VENOUS: 1.7 mmol/L (ref 0.5–2.0)
LACTIC ACID, VENOUS: 1.6 mmol/L (ref 0.5–2.0)

## 2015-07-10 LAB — COMPREHENSIVE METABOLIC PANEL
ALBUMIN: 2.5 g/dL — AB (ref 3.5–5.0)
ALT: 17 U/L (ref 17–63)
AST: 17 U/L (ref 15–41)
Alkaline Phosphatase: 91 U/L (ref 38–126)
Anion gap: 13 (ref 5–15)
BILIRUBIN TOTAL: 0.5 mg/dL (ref 0.3–1.2)
BUN: 101 mg/dL — AB (ref 6–20)
CHLORIDE: 103 mmol/L (ref 101–111)
CO2: 21 mmol/L — ABNORMAL LOW (ref 22–32)
CREATININE: 6.88 mg/dL — AB (ref 0.61–1.24)
Calcium: 8.5 mg/dL — ABNORMAL LOW (ref 8.9–10.3)
GFR calc Af Amer: 8 mL/min — ABNORMAL LOW (ref >60)
GFR calc non Af Amer: 7 mL/min — ABNORMAL LOW (ref >60)
Glucose, Bld: 214 mg/dL — ABNORMAL HIGH (ref 65–99)
POTASSIUM: 4.1 mmol/L (ref 3.5–5.1)
Sodium: 137 mmol/L (ref 135–145)
Total Protein: 5.9 g/dL — ABNORMAL LOW (ref 6.5–8.1)

## 2015-07-10 LAB — PROTIME-INR
INR: 4.25
INR: 4.69 — AB
Prothrombin Time: 39.8 seconds — ABNORMAL HIGH (ref 11.4–15.0)
Prothrombin Time: 42.8 seconds — ABNORMAL HIGH (ref 11.4–15.0)

## 2015-07-10 LAB — BRAIN NATRIURETIC PEPTIDE: B Natriuretic Peptide: 227 pg/mL — ABNORMAL HIGH (ref 0.0–100.0)

## 2015-07-10 LAB — TROPONIN I: Troponin I: 0.11 ng/mL — ABNORMAL HIGH (ref <0.031)

## 2015-07-10 MED ORDER — LISINOPRIL 10 MG PO TABS
40.0000 mg | ORAL_TABLET | Freq: Every day | ORAL | Status: DC
  Administered 2015-07-10: 40 mg via ORAL
  Filled 2015-07-10: qty 4

## 2015-07-10 MED ORDER — WARFARIN SODIUM 5 MG PO TABS: 5.0000 mg | ORAL_TABLET | Freq: Every day | ORAL | Status: DC

## 2015-07-10 MED ORDER — ATORVASTATIN CALCIUM 20 MG PO TABS
80.0000 mg | ORAL_TABLET | Freq: Every day | ORAL | Status: DC
  Administered 2015-07-10 – 2015-07-18 (×9): 80 mg via ORAL
  Filled 2015-07-10 (×10): qty 4

## 2015-07-10 MED ORDER — IPRATROPIUM-ALBUTEROL 0.5-2.5 (3) MG/3ML IN SOLN
3.0000 mL | Freq: Four times a day (QID) | RESPIRATORY_TRACT | Status: DC
Start: 2015-07-10 — End: 2015-07-11
  Administered 2015-07-10: 3 mL via RESPIRATORY_TRACT
  Filled 2015-07-10 (×2): qty 3

## 2015-07-10 MED ORDER — ENSURE ENLIVE PO LIQD
237.0000 mL | Freq: Three times a day (TID) | ORAL | Status: DC
  Administered 2015-07-11: 237 mL via ORAL

## 2015-07-10 MED ORDER — VANCOMYCIN HCL IN DEXTROSE 750-5 MG/150ML-% IV SOLN
750.0000 mg | INTRAVENOUS | Status: DC
  Filled 2015-07-10: qty 150

## 2015-07-10 MED ORDER — VANCOMYCIN HCL IN DEXTROSE 1-5 GM/200ML-% IV SOLN: 1000.0000 mg | Freq: Once | INTRAVENOUS | Status: DC

## 2015-07-10 MED ORDER — RISAQUAD PO CAPS
1.0000 | ORAL_CAPSULE | Freq: Two times a day (BID) | ORAL | Status: DC
  Administered 2015-07-10 – 2015-07-19 (×19): 1 via ORAL
  Filled 2015-07-10 (×19): qty 1

## 2015-07-10 MED ORDER — SODIUM CHLORIDE 0.9% FLUSH
3.0000 mL | Freq: Two times a day (BID) | INTRAVENOUS | Status: DC
  Administered 2015-07-10 – 2015-07-15 (×10): 3 mL via INTRAVENOUS
  Administered 2015-07-16: 10 mL via INTRAVENOUS
  Administered 2015-07-16 – 2015-07-18 (×3): 3 mL via INTRAVENOUS

## 2015-07-10 MED ORDER — HYDRALAZINE HCL 25 MG PO TABS
25.0000 mg | ORAL_TABLET | Freq: Three times a day (TID) | ORAL | Status: DC
  Administered 2015-07-11: 25 mg via ORAL
  Filled 2015-07-10 (×2): qty 1

## 2015-07-10 MED ORDER — AZITHROMYCIN 250 MG PO TABS: 250.0000 mg | ORAL_TABLET | Freq: Every day | ORAL | Status: DC

## 2015-07-10 MED ORDER — METHYLPREDNISOLONE SODIUM SUCC 125 MG IJ SOLR
60.0000 mg | Freq: Three times a day (TID) | INTRAMUSCULAR | Status: DC
  Administered 2015-07-10 – 2015-07-11 (×3): 60 mg via INTRAVENOUS
  Filled 2015-07-10 (×3): qty 2

## 2015-07-10 MED ORDER — COLESEVELAM HCL 625 MG PO TABS
1250.0000 mg | ORAL_TABLET | Freq: Two times a day (BID) | ORAL | Status: DC
  Administered 2015-07-10 – 2015-07-14 (×7): 1250 mg via ORAL
  Filled 2015-07-10 (×9): qty 2

## 2015-07-10 MED ORDER — AZITHROMYCIN 500 MG PO TABS
500.0000 mg | ORAL_TABLET | Freq: Every day | ORAL | Status: AC
  Administered 2015-07-10: 500 mg via ORAL
  Filled 2015-07-10: qty 1

## 2015-07-10 MED ORDER — ASPIRIN EC 81 MG PO TBEC
81.0000 mg | DELAYED_RELEASE_TABLET | Freq: Every day | ORAL | Status: DC
  Administered 2015-07-10 – 2015-07-15 (×6): 81 mg via ORAL
  Filled 2015-07-10 (×6): qty 1

## 2015-07-10 MED ORDER — HYDROCODONE-HOMATROPINE 5-1.5 MG/5ML PO SYRP: 5.0000 mL | ORAL_SOLUTION | Freq: Three times a day (TID) | ORAL | Status: DC | PRN

## 2015-07-10 MED ORDER — CETYLPYRIDINIUM CHLORIDE 0.05 % MT LIQD
7.0000 mL | Freq: Two times a day (BID) | OROMUCOSAL | Status: DC
  Administered 2015-07-10 – 2015-07-17 (×11): 7 mL via OROMUCOSAL

## 2015-07-10 MED ORDER — TRAMADOL HCL 50 MG PO TABS
50.0000 mg | ORAL_TABLET | Freq: Two times a day (BID) | ORAL | Status: DC
  Administered 2015-07-10 – 2015-07-11 (×2): 50 mg via ORAL
  Filled 2015-07-10 (×2): qty 1

## 2015-07-10 MED ORDER — ACETAMINOPHEN 325 MG PO TABS: 650.0000 mg | ORAL_TABLET | Freq: Four times a day (QID) | ORAL | Status: DC | PRN

## 2015-07-10 MED ORDER — LIDOCAINE-PRILOCAINE 2.5-2.5 % EX CREA
1.0000 "application " | TOPICAL_CREAM | CUTANEOUS | Status: DC | PRN
  Filled 2015-07-10: qty 5

## 2015-07-10 MED ORDER — NITROGLYCERIN 0.4 MG SL SUBL
0.4000 mg | SUBLINGUAL_TABLET | SUBLINGUAL | Status: DC | PRN
Start: 2015-07-10 — End: 2015-07-19

## 2015-07-10 MED ORDER — SODIUM CHLORIDE 0.9 % IV SOLN
1500.0000 mg | Freq: Once | INTRAVENOUS | Status: AC
  Administered 2015-07-10: 1500 mg via INTRAVENOUS
  Filled 2015-07-10: qty 1500

## 2015-07-10 MED ORDER — LORAZEPAM 0.5 MG PO TABS
0.2500 mg | ORAL_TABLET | ORAL | Status: DC
  Administered 2015-07-10: 0.25 mg via ORAL
  Filled 2015-07-10: qty 1

## 2015-07-10 MED ORDER — HEPARIN SODIUM (PORCINE) 5000 UNIT/ML IJ SOLN: 5000.0000 [IU] | Freq: Three times a day (TID) | INTRAMUSCULAR | Status: DC

## 2015-07-10 MED ORDER — BUDESONIDE 0.25 MG/2ML IN SUSP
0.2500 mg | Freq: Two times a day (BID) | RESPIRATORY_TRACT | Status: DC
  Administered 2015-07-10 – 2015-07-20 (×19): 0.25 mg via RESPIRATORY_TRACT
  Filled 2015-07-10 (×20): qty 2

## 2015-07-10 MED ORDER — ACETAMINOPHEN 650 MG RE SUPP: 650.0000 mg | Freq: Four times a day (QID) | RECTAL | Status: DC | PRN

## 2015-07-10 MED ORDER — DEXTROSE 5 % IV SOLN
2.0000 g | Freq: Once | INTRAVENOUS | Status: AC
  Administered 2015-07-10: 2 g via INTRAVENOUS
  Filled 2015-07-10 (×2): qty 2

## 2015-07-10 MED ORDER — DEXTROSE 5 % IV SOLN
2.0000 g | INTRAVENOUS | Status: DC
  Filled 2015-07-10: qty 2

## 2015-07-10 NOTE — ED Notes (Signed)
Patient transported to CT 

## 2015-07-10 NOTE — Telephone Encounter (Signed)
Needs to go to ER.Thanks.

## 2015-07-10 NOTE — Consult Note (Signed)
Pharmacy Antibiotic Note  John Robinson is a 82 y.o. male admitted on 06/29/2015 with pneumonia.  Pharmacy has been consulted for vancomycin and cefepime dosing. Pt is an ESRD pt on HD Tue,Thus, Sat  Plan: Vancomycin 1500mg  once. Then 750mg  q Tues,Thurs,Sat after HD. Cefepime 2g now, then 2g q HD Will draw vancomycin level prior to 3rd HD session 1 July Goal pre HD level= 15-4mcg/ml  Height: 6' (182.9 cm) Weight: 150 lb (68.04 kg) IBW/kg (Calculated) : 77.6  Temp (24hrs), Avg:97.8 F (36.6 C), Min:97.8 F (36.6 C), Max:97.8 F (36.6 C)   Recent Labs Lab 07/06/2015 1240 07/13/2015 1530  WBC 24.7*  --   CREATININE 6.88*  --   LATICACIDVEN  --  1.7    Estimated Creatinine Clearance: 8.1 mL/min (by C-G formula based on Cr of 6.88).    Allergies  Allergen Reactions  . Pletal [Cilostazol] Other (See Comments)    Reaction:  Dizziness    Antimicrobials this admission: vancomycin 6/26 >>  cefepime 6/26 >>  Azithromycin 6/26>>  Dose adjustments this admission:   Microbiology results: 6/26 BCx: pending 6/26 cdiff: pending  Thank you for allowing pharmacy to be a part of this patient's care.  Ramond Dial, Pharm.D Clinical Pharmacist   07/14/2015 4:25 PM

## 2015-07-10 NOTE — Telephone Encounter (Signed)
Ria Comment from [facility] called stating that pt has had diarrhea with mucous starting last night around 11 every hr.Now pt has some shortness of breath.Please advise.Call back [phone]

## 2015-07-10 NOTE — Progress Notes (Signed)
ANTICOAGULATION CONSULT NOTE - Initial Consult  Pharmacy Consult for Warfarin  Indication: VTE prophylaxis  Allergies  Allergen Reactions  . Pletal [Cilostazol] Other (See Comments)    Reaction:  Dizziness    Patient Measurements: Height: 6' (182.9 cm) Weight: 150 lb (68.04 kg) IBW/kg (Calculated) : 77.6 Heparin Dosing Weight:   Vital Signs: Temp: 97.6 F (36.4 C) (06/26 1711) Temp Source: Oral (06/26 1236) BP: 142/38 mmHg (06/26 1711) Pulse Rate: 81 (06/26 1711)  Labs:  Recent Labs  06/15/2015 1240  HGB 11.8*  HCT 35.3*  PLT 269  LABPROT 39.8*  INR 4.25*  CREATININE 6.88*  TROPONINI 0.11*    Estimated Creatinine Clearance: 8.1 mL/min (by C-G formula based on Cr of 6.88).   Medical History: Past Medical History  Diagnosis Date  . Chicken pox   . Measles   . Mumps   . History of MI (myocardial infarction)   . History of malignant melanoma   . History of recurrent UTIs   . Coronary artery disease   . Hypertension   . Chronic kidney disease     Medications:  Prescriptions prior to admission  Medication Sig Dispense Refill Last Dose  . acidophilus (RISAQUAD) CAPS capsule Take 1 capsule by mouth 2 (two) times daily.   07/09/2015 at Unknown time  . amLODipine (NORVASC) 10 MG tablet Take 10 mg by mouth daily.   06/16/2015 at Unknown time  . aspirin EC 81 MG tablet Take 81 mg by mouth daily.   07/09/2015 at 0800  . atorvastatin (LIPITOR) 80 MG tablet Take 80 mg by mouth at bedtime.    07/09/2015 at Unknown time  . cefdinir (OMNICEF) 300 MG capsule Take 1 capsule (300 mg total) by mouth 2 (two) times daily. 14 capsule 0 07/09/2015 at Unknown time  . colesevelam (WELCHOL) 625 MG tablet Take 1,250 mg by mouth 2 (two) times daily with a meal.    07/09/2015 at Unknown time  . glipiZIDE (GLUCOTROL) 5 MG tablet Take 1 tablet (5 mg total) by mouth daily before breakfast. 90 tablet 4 07/09/2015 at Unknown time  . hydrALAZINE (APRESOLINE) 25 MG tablet Take 25 mg by mouth every 8  (eight) hours.    07/09/2015 at Unknown time  . HYDROcodone-homatropine (HYCODAN) 5-1.5 MG/5ML syrup Take 5 mLs by mouth every 8 (eight) hours as needed for cough. 120 mL 0 07/09/2015 at 2100  . insulin aspart (NOVOLOG) 100 UNIT/ML injection Inject into the skin 3 (three) times daily with meals as needed for high blood sugar. Pt uses as needed per sliding scale.   07/08/2015 at Unknown time  . lidocaine-prilocaine (EMLA) cream Apply 1 application topically as needed (prior to accessing port).    Past Month at Unknown time  . lisinopril (PRINIVIL,ZESTRIL) 40 MG tablet Take 40 mg by mouth daily.    07/09/2015 at Unknown time  . LORazepam (ATIVAN) 0.5 MG tablet Take 0.25 mg by mouth 3 (three) times a week. Pt takes on Tuesday, Thursday, and Saturday.   07/08/2015 at unknown  . nitroGLYCERIN (NITROSTAT) 0.4 MG SL tablet Place 0.4 mg under the tongue every 5 (five) minutes as needed for chest pain.   Past Month at Unknown time  . traMADol (ULTRAM) 50 MG tablet Take 50 mg by mouth 2 (two) times daily.   07/09/2015 at 2100  . warfarin (COUMADIN) 5 MG tablet Take 5 mg by mouth at bedtime.    07/09/2015 at 2100    Assessment: Pharmacy consulted to dose warfarin in this 82  year old male admitted with PNA.  Pt was on warfarin 5 mg PO QHS at home.  INR on 6/26 was 4.28.    Goal of Therapy:  INR 2-3   Plan:  Will hold warfarin until INR is therapeutic. Will recheck INR daily .   Romond Pipkins D 06/27/2015,5:12 PM

## 2015-07-10 NOTE — ED Notes (Signed)
Pharmacy called and notified of need of medication. States they will send it.  

## 2015-07-10 NOTE — Telephone Encounter (Signed)
Spoke with Mr. Rief.  He agree and he is going to go to the ER.   Thanks,   -Mickel Baas

## 2015-07-10 NOTE — ED Notes (Signed)
Per [facility], patient comes from home with c/o breathing difficulty. Recently at [facility] for triple bipass and pneumonia. When he was discharged he spent a short time at [facility]. Patient is a dialysis patient. Did not go this Saturday because he felt bad. Patient denies feeling SOB at this time. Patient denies pain.

## 2015-07-10 NOTE — H&P (Addendum)
[facility] [place] NAME: John Robinson    MR#:  [id number]  DATE OF BIRTH:  Nov 19, 1932  DATE OF ADMISSION:  06/30/2015  PRIMARY CARE PHYSICIAN: Lelon Huh, MD   REQUESTING/REFERRING PHYSICIAN: Dr Lavonia Drafts  CHIEF COMPLAINT:   Chief Complaint  Patient presents with  . Respiratory Distress    HISTORY OF PRESENT ILLNESS:  John Robinson  is a 82 y.o. male with a known history of End-stage renal disease, CAD. He presents with shortness of breath and feeling horrible. He states that he has a sore throat every time he swallows. He has been having trouble swallowing. His sides of min hurting. He is having cold chills. Is coughing up greenish phlegm. He is having diarrhea every hour. Had a recent long course at [facility] including a bypass surgery and then re-opening and infection of the wound. He was at rehabilitation for about 6 weeks and then been home for about 1 week.  PAST MEDICAL HISTORY:   Past Medical History  Diagnosis Date  . Chicken pox   . Measles   . Mumps   . History of MI (myocardial infarction)   . History of malignant melanoma   . History of recurrent UTIs   . Coronary artery disease   . Hypertension   . Chronic kidney disease     PAST SURGICAL HISTORY:   Past Surgical History  Procedure Laterality Date  . Rca stent s/p mi  12/05/2000  . Arm surgery Left   . Hip surgery Left   . Appendectomy  1957  . Coronary angioplasty Right 12/22/1996    Right Iliac with stent  . Peripheral vascular catheterization N/A 05/18/2014    Procedure: A/V Shuntogram/Fistulagram;  Surgeon: Algernon Huxley, MD;  Location: [place] [facility];  Service: Cardiovascular;  Laterality: N/A;  . Tympanoplasty Left 11/2008    Left ear/Dr. Tami Ribas  . Aorta-iliac-femoral bypass  02/2012    Aorta-left iliac bypass with iliac right femoral bypass. Repair pseudoanuerysm. Done at [facility]  . Aorta-iliac-femoral bypass  1987  . Melanoma excision   05/25/2002    Chest wall  . Lad and rca stent  02/09/1999  . Peripheral vascular catheterization N/A 06/08/2014    Procedure: Dialysis/Perma Catheter Removal;  Surgeon: Algernon Huxley, MD;  Location: [place] [facility];  Service: Cardiovascular;  Laterality: N/A;  . Peripheral vascular catheterization N/A 10/06/2014    Procedure: A/V Shuntogram/Fistulagram;  Surgeon: Algernon Huxley, MD;  Location: [place] [facility];  Service: Cardiovascular;  Laterality: N/A;  . Peripheral vascular catheterization Left 10/06/2014    Procedure: A/V Shunt Intervention;  Surgeon: Algernon Huxley, MD;  Location: [place] [facility];  Service: Cardiovascular;  Laterality: Left;  . Coronary artery bypass graft  04/03/2015    4V at [place]:   Social History  Substance Use Topics  . Smoking status: Former Smoker -- 0.25 packs/day    Types: Cigarettes    Quit date: 05/17/2012  . Smokeless tobacco: Never Used  . Alcohol Use: No    FAMILY HISTORY:   Family History  Problem Relation Age of Onset  . Cancer Mother     Lymphatic cancer  . Lung cancer Father   . Diabetes Brother     type 2  . Heart attack Brother     DRUG ALLERGIES:   Allergies  Allergen Reactions  . Pletal [Cilostazol] Other (See Comments)    Reaction:  Dizziness  REVIEW OF SYSTEMS:  CONSTITUTIONAL: No fever, fatigue or weakness. Positive for cold chills. Positive for weight loss. EYES: No blurred or double vision. Wears glasses. EARS, NOSE, AND THROAT: No tinnitus or ear pain. Positive for sore throat, runny nose and dysphagia RESPIRATORY: Positive for cough and shortness of breath, no wheezing or hemoptysis.  CARDIOVASCULAR: No chest pain, positive for pain on the sides area no orthopnea. Positive for edema.  GASTROINTESTINAL: Positive for nausea, vomiting, diarrhea and abdominal pain. No blood in bowel movements GENITOURINARY: No dysuria, hematuria.  ENDOCRINE: No polyuria, nocturia,  HEMATOLOGY: Positive for easy  bruising and bleeding SKIN: Bruising upper upper extremities, chronic discoloration lower extremities MUSCULOSKELETAL: No joint pain or arthritis.   NEUROLOGIC: No tingling, numbness, weakness.  PSYCHIATRY: Positive for anxiety. No depression.   MEDICATIONS AT HOME:   Prior to Admission medications   Medication Sig Start Date End Date Taking? Authorizing Provider  acidophilus (RISAQUAD) CAPS capsule Take 1 capsule by mouth 2 (two) times daily.   Yes Historical Provider, MD  aspirin EC 81 MG tablet Take 81 mg by mouth daily.   Yes Historical Provider, MD  atorvastatin (LIPITOR) 80 MG tablet Take 80 mg by mouth at bedtime.    Yes Historical Provider, MD  cefdinir (OMNICEF) 300 MG capsule Take 1 capsule (300 mg total) by mouth 2 (two) times daily. 07/07/15 07/14/15 Yes Birdie Sons, MD  colesevelam (WELCHOL) 625 MG tablet Take 1,250 mg by mouth 2 (two) times daily with a meal.    Yes Historical Provider, MD  glipiZIDE (GLUCOTROL) 5 MG tablet Take 1 tablet (5 mg total) by mouth daily before breakfast. 02/01/15  Yes Birdie Sons, MD  hydrALAZINE (APRESOLINE) 25 MG tablet Take 25 mg by mouth every 8 (eight) hours.    Yes Historical Provider, MD  HYDROcodone-homatropine (HYCODAN) 5-1.5 MG/5ML syrup Take 5 mLs by mouth every 8 (eight) hours as needed for cough. 07/07/15  Yes Birdie Sons, MD  lidocaine-prilocaine (EMLA) cream Apply 1 application topically as needed (prior to accessing port).    Yes Historical Provider, MD  lisinopril (PRINIVIL,ZESTRIL) 40 MG tablet Take 40 mg by mouth daily.    Yes Historical Provider, MD  LORazepam (ATIVAN) 0.5 MG tablet Take 0.25 mg by mouth 3 (three) times a week. Pt takes on Tuesday, Thursday, and Saturday.   Yes Historical Provider, MD  nitroGLYCERIN (NITROSTAT) 0.4 MG SL tablet Place 0.4 mg under the tongue every 5 (five) minutes as needed for chest pain.   Yes Historical Provider, MD  traMADol (ULTRAM) 50 MG tablet Take 50 mg by mouth 2 (two) times daily.    Yes Historical Provider, MD  warfarin (COUMADIN) 5 MG tablet Take 5 mg by mouth at bedtime.    Yes Historical Provider, MD      VITAL SIGNS:  Blood pressure 120/44, pulse 94, temperature 97.8 F (36.6 C), temperature source Oral, resp. rate 18, height 6' (1.829 m), weight 68.04 kg (150 lb), SpO2 100 %.  PHYSICAL EXAMINATION:  GENERAL:  82 y.o.-year-old patient lying in the bed with no acute distress.  EYES: Pupils equal, round, reactive to light and accommodation. No scleral icterus. Extraocular muscles intact.  HEENT: Head atraumatic, normocephalic. Oropharynx and nasopharynx clear.  NECK:  Supple, no jugular venous distention. No thyroid enlargement, no tenderness.  LUNGS: Decreased breath sounds bilaterally, some wheezing. No rales,rhonchi or crepitation. No use of accessory muscles of respiration.  CARDIOVASCULAR: S1, S2 normal. No murmurs, rubs, or gallops.  ABDOMEN: Soft, nontender, nondistended.  Bowel sounds present. No organomegaly or mass.  EXTREMITIES: Positive for pedal edema. No cyanosis, or clubbing.  NEUROLOGIC: Cranial nerves II through XII are intact. Muscle strength 5/5 in all extremities. Sensation intact. Gait not checked.  PSYCHIATRIC: The patient is alert and oriented x 3.  SKIN: Ulcer on the chest looks like it's healing well. Stage II decubiti on buttock  LABORATORY PANEL:   CBC  Recent Labs Lab 06/19/2015 1240  WBC 24.7*  HGB 11.8*  HCT 35.3*  PLT 269   ------------------------------------------------------------------------------------------------------------------  Chemistries   Recent Labs Lab 07/14/2015 1240  NA 137  K 4.1  CL 103  CO2 21*  GLUCOSE 214*  BUN 101*  CREATININE 6.88*  CALCIUM 8.5*  AST 17  ALT 17  ALKPHOS 91  BILITOT 0.5   ------------------------------------------------------------------------------------------------------------------  Cardiac Enzymes  Recent Labs Lab 06/28/2015 1240  TROPONINI 0.11*    ------------------------------------------------------------------------------------------------------------------  RADIOLOGY:  Ct Chest Wo Contrast  06/29/2015  CLINICAL DATA:  Breathing difficulty. Recent CABG and pneumonia at [facility]. Dialysis patient. EXAM: CT CHEST WITHOUT CONTRAST TECHNIQUE: Multidetector CT imaging of the chest was performed following the standard protocol without IV contrast. COMPARISON:  Abdominal CT 03/04/2012 FINDINGS: Left IJ central venous catheter with tip over the cavoatrial junction. Cardiovascular: Sternotomy wires present. Evidence of patient's recent coronary bypass. Suggestion a stent over the anterior descending coronary artery. Mild prominence heart. No significant pericardial collection. Mild atherosclerotic thoracic aorta. Mediastinum: No mediastinal or adenopathy. Remaining mediastinal structures within normal. Lungs/Pleura: Lungs adequately inflated and demonstrate bilateral patchy hazy nodular airspace process right worse than left likely due to infection. Linear atelectasis over the left base. Small amount left pleural fluid. Possible tiny amount of aspirate material over the distal trachea. Upper Abdomen: Possible subtle cholelithiasis. 1.5 cm hypodensity over the upper pole left kidney unchanged likely slightly hyperdense cyst. Left renal cortical atrophy unchanged. Colon is only partly visualized although there does appear to be wall thickening/submucosal edema with adjacent inflammatory change in the pericolonic fat suggesting an acute colitis. Partially visualized aortic stent graft. Musculoskeletal: Moderate degenerate changes spine. Minimal loss mid to anterior vertebral body height of a lower thoracic vertebral body unchanged. IMPRESSION: Bilateral patchy hazy nodular airspace process right worse than left likely a pneumonia. Minimal linear atelectasis left base. Small left effusion. Possible tiny amount of aspirate material over distal trachea as consider  aspiration pneumonia. Partially visualized colon demonstrating wall thickening/ submucosal edema and adjacent inflammatory change suggesting acute colitis. Postsurgical change compatible recent coronary bypass. Partially abdominal aortic stent graft. Stable 1.5 cm hypodensity over the upper pole left kidney likely slightly hyperdense cyst. Left renal cortical atrophy unchanged. Stable mild compression deformity over the lower thoracic spine. Electronically Signed   By: Marin Olp M.D.   On: 06/15/2015 15:32   Dg Chest Portable 1 View  06/23/2015  CLINICAL DATA:  Breathing difficulty/dyspnea. Dialysis patient. Shortness of breath. EXAM: PORTABLE CHEST 1 VIEW COMPARISON:  12/02/2014 FINDINGS: Interval CABG. Dialysis catheter noted with distal tip at the cavoatrial junction. Atherosclerotic aortic arch. Coronary stents. Right axillary clips. Left axillary stent. Linear subsegmental atelectasis or scarring at the left lung base. Borderline enlargement of the cardiopericardial silhouette. IMPRESSION: 1. Borderline enlargement of the cardiopericardial silhouette but no edema. 2. Interval CABG pre coronary stents noted. 3. Atherosclerotic aortic arch. 4. Left IJ dialysis catheter in place, tip cavoatrial junction. No pneumothorax. 5. No edema. Electronically Signed   By: Van Clines M.D.   On: 06/26/2015 13:15    EKG:  Normal sinus rhythm 90 bpm, right bundle branch block, left anterior fascicular block  IMPRESSION AND PLAN:   1. Clinical sepsis with bilateral pneumonia, tachycardia and leukocytosis. We'll give triple antibiotics with vancomycin, Maxipime and Zithromax. Likely this is an aspiration pneumonia but since the patient has been in the hospital and the dialysis patient will give triple antibiotics at this point. With his diarrhea I also need to rule out C. Difficile. 2. End-stage renal disease on dialysis Tuesday Thursday and Saturday. [facility] consult nephrology 3. History of coronary  artery disease. Borderline troponin. Continue aspirin. Troponin likely borderline secondary to dialysis patient 4. Essential hypertension continue usual medication 5. Peripheral vascular disease on Coumadin we'll check an INR and dose Coumadin. 6. COPD exacerbation. Add nebulizer treatments Solu-Medrol and budesonide nebulizers. Antibiotics as previously ordered. 7. I will put on dysphagia diet and get a swallow evaluation tomorrow. I'm hoping antibiotics and steroids will help out with the patient's pain on swallowing urine 8. Stage II decubiti on buttock. 9. Chest wound with no signs of infection.  All the records are reviewed and case discussed with ED provider. Management plans discussed with the patient, family and they are in agreement.  CODE STATUS: Full code  TOTAL TIME TAKING CARE OF THIS PATIENT: 55 minutes.    Loletha Grayer M.D on 06/28/2015 at 4:13 PM  Between 7am to 6pm - Pager - [phone]  After 6pm call admission pager [phone]  [employer] [facility]  [phone]  CC: Primary care physician; Lelon Huh, MD

## 2015-07-10 NOTE — ED Provider Notes (Signed)
[facility] Emergency Department Provider Note  ____________________________________________    I have reviewed the triage vital signs and the nursing notes.   HISTORY  Chief Complaint Respiratory Distress    HPI John Robinson is a 82 y.o. male with a recent CABG with a complicated postoperative course and history of end-stage renal disease who presents with productive cough for approximately 3 days. He notes that he has been feeling weak and missed dialysis on Saturday because he didn't feel well. He reports chills but has not checked his temperature. He denies chest pain. No recent travel. No calf pain or swelling.     Past Medical History  Diagnosis Date  . Chicken pox   . Measles   . Mumps   . History of MI (myocardial infarction)   . History of malignant melanoma   . History of recurrent UTIs     Patient Active Problem List   Diagnosis Date Noted  . COPD (chronic obstructive pulmonary disease) ([place]) 10/03/2014  . Senile purpura ([place]) 06/28/2014  . Allergic rhinitis 06/08/2014  . Cervical neck pain with evidence of disc disease 06/08/2014  . Chronic kidney disease (CKD), stage IV (severe) ([place]) 06/08/2014  . Contusion of forehead 06/08/2014  . Narrowing of intervertebral disc space 06/08/2014  . Dermatitis 06/08/2014  . Dizziness 06/08/2014  . Edema 06/08/2014  . ESRD on dialysis ([place]) 06/08/2014  . GERD (gastroesophageal reflux disease) 06/08/2014  . History of abdominal aortic aneurysm repair 06/08/2014  . History of acute myocardial infarction 06/08/2014  . History of malignant melanoma 06/08/2014  . Hyperlipidemia 06/08/2014  . Hypertension 06/08/2014  . Neoplasm of uncertain behavior [id number]  . Neck stiffness 06/08/2014  . Peripheral vascular disease ([place]) 06/08/2014  . Cervical nerve root disorder 06/08/2014  . Renal artery stenosis ([place]) 06/08/2014  . Renal vascular disease 06/08/2014  . Smokers' cough ([place]) 06/08/2014   . Tobacco consumption 06/08/2014  . CAD (coronary artery disease) 04/12/2014  . Chest pain 04/12/2014  . End stage kidney disease ([place]) 04/12/2014  . Hypertensive urgency 04/12/2014  . Acute MI ([place]) 04/06/2014  . H/O cardiac catheterization 04/06/2014  . Iliac artery occlusion (HCC) 04/06/2014  . Aneurysm ([place]) 05/14/2012  . AAA (abdominal aortic aneurysm) ([place]) 05/14/2012  . Angiopathy, peripheral ([place]) 05/14/2012  . H. pylori infection 01/14/2012  . Renal cyst 08/27/2010  . Diabetes mellitus with nephropathy ([place]) 11/24/2006    Past Surgical History  Procedure Laterality Date  . Rca stent s/p mi  12/05/2000  . Arm surgery Left   . Hip surgery Left   . Appendectomy  1957  . Coronary angioplasty Right 12/22/1996    Right Iliac with stent  . Peripheral vascular catheterization N/A 05/18/2014    Procedure: A/V Shuntogram/Fistulagram;  Surgeon: Algernon Huxley, MD;  Location: [place] CV LAB;  Service: Cardiovascular;  Laterality: N/A;  . Tympanoplasty Left 11/2008    Left ear/Dr. Tami Ribas  . Aorta-iliac-femoral bypass  02/2012    Aorta-left iliac bypass with iliac right femoral bypass. Repair pseudoanuerysm. Done at [facility]  . Aorta-iliac-femoral bypass  1987  . Melanoma excision  05/25/2002    Chest wall  . Lad and rca stent  02/09/1999  . Peripheral vascular catheterization N/A 06/08/2014    Procedure: Dialysis/Perma Catheter Removal;  Surgeon: Algernon Huxley, MD;  Location: [place] CV LAB;  Service: Cardiovascular;  Laterality: N/A;  . Peripheral vascular catheterization N/A 10/06/2014    Procedure: A/V Shuntogram/Fistulagram;  Surgeon: Algernon Huxley, MD;  Location: [place] CV LAB;  Service: Cardiovascular;  Laterality: N/A;  . Peripheral vascular catheterization Left 10/06/2014    Procedure: A/V Shunt Intervention;  Surgeon: Algernon Huxley, MD;  Location: [place] CV LAB;  Service: Cardiovascular;  Laterality: Left;  . Coronary artery bypass graft  04/03/2015    4V at [facility]     Current Outpatient Rx  Name  Route  Sig  Dispense  Refill  . ACCU-CHEK AVIVA PLUS test strip      TEST DAILY   100 each   3   . amLODipine (NORVASC) 10 MG tablet   Oral   Take 10 mg by mouth daily.         Marland Kitchen aspirin EC 81 MG tablet   Oral   Take 81 mg by mouth daily.         Marland Kitchen atorvastatin (LIPITOR) 80 MG tablet   Oral   Take 80 mg by mouth at bedtime.          . budesonide-formoterol (SYMBICORT) 160-4.5 MCG/ACT inhaler   Inhalation   Inhale 1 puff into the lungs 2 (two) times daily.   1 Inhaler   5   . cefdinir (OMNICEF) 300 MG capsule   Oral   Take 1 capsule (300 mg total) by mouth 2 (two) times daily.   14 capsule   0   . Cholecalciferol (VITAMIN D-1000 MAX ST) 1000 units tablet   Oral   Take 4 tablets by mouth daily.         . colesevelam (WELCHOL) 625 MG tablet   Oral   Take 1,875 mg by mouth 2 (two) times daily with a meal.          . glipiZIDE (GLUCOTROL) 5 MG tablet   Oral   Take 1 tablet (5 mg total) by mouth daily before breakfast.   90 tablet   4   . hydrALAZINE (APRESOLINE) 25 MG tablet   Oral   Take 25 mg by mouth.         Marland Kitchen HYDROcodone-homatropine (HYCODAN) 5-1.5 MG/5ML syrup   Oral   Take 5 mLs by mouth every 8 (eight) hours as needed for cough.   120 mL   0   . lidocaine-prilocaine (EMLA) cream   Topical   Apply 1 application topically as needed (prior to accessing port).          Marland Kitchen lisinopril (PRINIVIL,ZESTRIL) 40 MG tablet   Oral   Take 40 mg by mouth.         . loratadine (CLARITIN) 10 MG tablet   Oral   Take 10 mg by mouth every evening.         . Multiple Vitamins-Minerals (MULTIVITAMIN WITH MINERALS) tablet   Oral   Take 1 tablet by mouth daily.         . nitroGLYCERIN (NITROSTAT) 0.4 MG SL tablet      DISSOLVE 1 TABLET UNDER THE TONGUE EVERY 5 MINUTES AS NEEDED FOR CHEST PAIN; GO TO E.R. IF NO RELIEF AFTER 3 DOSES   75 tablet   3   . omeprazole (PRILOSEC) 20 MG capsule   Oral   Take 20 mg  by mouth daily.         . polyethylene glycol (MIRALAX / GLYCOLAX) packet   Oral   Take by mouth.         . traZODone (DESYREL) 50 MG tablet   Oral   Take 50 mg by mouth.         Marland Kitchen  warfarin (COUMADIN) 5 MG tablet   Oral   Take 5 mg by mouth daily.           Allergies Review of patient's allergies indicates no known allergies.  Family History  Problem Relation Age of Onset  . Cancer Mother     Lymphatic cancer  . Lung cancer Father   . Diabetes Brother     type 2  . Heart attack Brother     Social History Social History  Substance Use Topics  . Smoking status: Former Smoker -- 0.25 packs/day    Types: Cigarettes    Quit date: 05/17/2012  . Smokeless tobacco: Never Used  . Alcohol Use: No    Review of Systems  Constitutional: Positive fatigue, positive chills Eyes: Negative for redness ENT: Negative for sore throat Cardiovascular: Negative for chest pain Respiratory: Positive cough Gastrointestinal: Negative for abdominal pain Genitourinary: Negative for dysuria. Musculoskeletal: Negative for back pain. Skin: Negative for rash. Neurological: Negative for focal weakness Psychiatric: no anxiety    ____________________________________________   PHYSICAL EXAM:  VITAL SIGNS: ED Triage Vitals  Enc Vitals Group     BP 07/13/2015 1305 123/60 mmHg     Pulse Rate 06/20/2015 1305 89     Resp 07/04/2015 1305 20     Temp 06/15/2015 1236 97.8 F (36.6 C)     Temp Source 06/24/2015 1236 Oral     SpO2 06/25/2015 1305 100 %     Weight 07/11/2015 1236 150 lb (68.04 kg)     Height 06/28/2015 1236 6' (1.829 m)     Head Cir --      Peak Flow --      Pain Score --      Pain Loc --      Pain Edu? --      Excl. in [place]? --      Constitutional: Alert and oriented. Ill-appearing but in no acute distress Eyes: Conjunctivae are normal. No erythema or injection ENT   Head: Normocephalic and atraumatic.   Mouth/Throat: Mucous membranes are moist. Cardiovascular:  Normal rate, regular rhythm. Normal and symmetric distal pulses are present in the upper extremities. Dialysis catheter left chest Respiratory: Normal respiratory effort without tachypnea nor retractions. Breath sounds are clear and equal bilaterally.  Gastrointestinal: Soft and non-tender in all quadrants. No distention.  Genitourinary: deferred Musculoskeletal: Nontender with normal range of motion in all extremities. No lower extremity tenderness nor edema. Neurologic:  Normal speech and language. No gross focal neurologic deficits are appreciated. Skin:  Skin is warm, dry and intact. No rash noted. Psychiatric: Mood and affect are normal. Patient exhibits appropriate insight and judgment.  ____________________________________________    LABS (pertinent positives/negatives)  Labs Reviewed  CBC WITH DIFFERENTIAL/PLATELET - Abnormal; Notable for the following:    WBC 24.7 (*)    RBC 4.13 (*)    Hemoglobin 11.8 (*)    HCT 35.3 (*)    RDW 20.6 (*)    Neutro Abs 21.2 (*)    All other components within normal limits  COMPREHENSIVE METABOLIC PANEL - Abnormal; Notable for the following:    CO2 21 (*)    Glucose, Bld 214 (*)    BUN 101 (*)    Creatinine, Ser 6.88 (*)    Calcium 8.5 (*)    Total Protein 5.9 (*)    Albumin 2.5 (*)    GFR calc non Af Amer 7 (*)    GFR calc Af Amer 8 (*)    All other components  within normal limits  TROPONIN I - Abnormal; Notable for the following:    Troponin I 0.11 (*)    All other components within normal limits  BRAIN NATRIURETIC PEPTIDE - Abnormal; Notable for the following:    B Natriuretic Peptide 227.0 (*)    All other components within normal limits  CULTURE, BLOOD (ROUTINE X 2)  CULTURE, BLOOD (ROUTINE X 2)  PROTIME-INR  LACTIC ACID, PLASMA  LACTIC ACID, PLASMA    ____________________________________________   EKG  None  ____________________________________________    RADIOLOGY  Chest x-ray unremarkable CT chest with  bibasilar pneumonia  ____________________________________________   PROCEDURES  Procedure(s) performed: none  Critical Care performed: none  ____________________________________________   INITIAL IMPRESSION / ASSESSMENT AND PLAN / ED COURSE  Pertinent labs & imaging results that were available during my care of the patient were reviewed by me and considered in my medical decision making (see chart for details).  Patient presents with cough and weakness. We'll obtain labs, chest x-ray and reevaluate  Patient was initially elevated white blood cell count. Given productive cough I'm suspicious for pneumonia despite normal chest x-ray, we will sent for CT scan  CT scan confirms pneumonia. Patient treated for healthcare associated pneumonia given recent [facility] hospitalization.   ____________________________________________   FINAL CLINICAL IMPRESSION(S) / ED DIAGNOSES  Final diagnoses:  Healthcare-associated pneumonia          Lavonia Drafts, MD 06/26/2015 1540

## 2015-07-11 DIAGNOSIS — N186 End stage renal disease: Secondary | ICD-10-CM

## 2015-07-11 DIAGNOSIS — R197 Diarrhea, unspecified: Secondary | ICD-10-CM

## 2015-07-11 DIAGNOSIS — I4892 Unspecified atrial flutter: Secondary | ICD-10-CM

## 2015-07-11 DIAGNOSIS — A047 Enterocolitis due to Clostridium difficile: Secondary | ICD-10-CM

## 2015-07-11 DIAGNOSIS — J189 Pneumonia, unspecified organism: Secondary | ICD-10-CM

## 2015-07-11 DIAGNOSIS — Z992 Dependence on renal dialysis: Secondary | ICD-10-CM

## 2015-07-11 LAB — CBC
HCT: 33.8 % — ABNORMAL LOW (ref 40.0–52.0)
Hemoglobin: 11 g/dL — ABNORMAL LOW (ref 13.0–18.0)
MCH: 28.3 pg (ref 26.0–34.0)
MCHC: 32.6 g/dL (ref 32.0–36.0)
MCV: 86.8 fL (ref 80.0–100.0)
PLATELETS: 257 10*3/uL (ref 150–440)
RBC: 3.9 MIL/uL — AB (ref 4.40–5.90)
RDW: 20.7 % — ABNORMAL HIGH (ref 11.5–14.5)
WBC: 24.2 10*3/uL — ABNORMAL HIGH (ref 3.8–10.6)

## 2015-07-11 LAB — BASIC METABOLIC PANEL
ANION GAP: 15 (ref 5–15)
BUN: 212 mg/dL — ABNORMAL HIGH (ref 6–20)
CALCIUM: 8.4 mg/dL — AB (ref 8.9–10.3)
CO2: 18 mmol/L — AB (ref 22–32)
CREATININE: 8.1 mg/dL — AB (ref 0.61–1.24)
Chloride: 106 mmol/L (ref 101–111)
GFR, EST AFRICAN AMERICAN: 6 mL/min — AB (ref >60)
GFR, EST NON AFRICAN AMERICAN: 5 mL/min — AB (ref >60)
Glucose, Bld: 249 mg/dL — ABNORMAL HIGH (ref 65–99)
Potassium: 5.3 mmol/L — ABNORMAL HIGH (ref 3.5–5.1)
Sodium: 139 mmol/L (ref 135–145)

## 2015-07-11 LAB — C DIFFICILE QUICK SCREEN W PCR REFLEX
C DIFFICLE (CDIFF) ANTIGEN: POSITIVE — AB
C Diff interpretation: POSITIVE
C Diff toxin: POSITIVE — AB

## 2015-07-11 LAB — GLUCOSE, CAPILLARY
GLUCOSE-CAPILLARY: 331 mg/dL — AB (ref 65–99)
Glucose-Capillary: 227 mg/dL — ABNORMAL HIGH (ref 65–99)

## 2015-07-11 LAB — PROTIME-INR
INR: 5.26 — AB
INR: 5.67
PROTHROMBIN TIME: 46.7 s — AB (ref 11.4–15.0)
Prothrombin Time: 49.4 seconds — ABNORMAL HIGH (ref 11.4–15.0)

## 2015-07-11 LAB — MRSA PCR SCREENING: MRSA by PCR: POSITIVE — AB

## 2015-07-11 MED ORDER — PHYTONADIONE 5 MG PO TABS
5.0000 mg | ORAL_TABLET | Freq: Once | ORAL | Status: AC
  Administered 2015-07-11: 5 mg via ORAL
  Filled 2015-07-11: qty 1

## 2015-07-11 MED ORDER — EPOETIN ALFA 4000 UNIT/ML IJ SOLN
4000.0000 [IU] | INTRAMUSCULAR | Status: DC
  Administered 2015-07-11: 4000 [IU] via INTRAVENOUS
  Filled 2015-07-11 (×2): qty 1

## 2015-07-11 MED ORDER — LIDOCAINE VISCOUS 2 % MT SOLN
5.0000 mL | Freq: Three times a day (TID) | OROMUCOSAL | Status: DC
  Administered 2015-07-11 – 2015-07-16 (×13): 5 mL via OROMUCOSAL
  Filled 2015-07-11 (×15): qty 15

## 2015-07-11 MED ORDER — METRONIDAZOLE IN NACL 5-0.79 MG/ML-% IV SOLN
500.0000 mg | Freq: Three times a day (TID) | INTRAVENOUS | Status: DC
  Administered 2015-07-11 – 2015-07-14 (×8): 500 mg via INTRAVENOUS
  Filled 2015-07-11 (×11): qty 100

## 2015-07-11 MED ORDER — MAGIC MOUTHWASH W/LIDOCAINE: 5.0000 mL | Freq: Three times a day (TID) | ORAL | Status: DC

## 2015-07-11 MED ORDER — DEXTROSE 5 % IV SOLN
2.0000 g | INTRAVENOUS | Status: DC
  Filled 2015-07-11: qty 2

## 2015-07-11 MED ORDER — VANCOMYCIN 50 MG/ML ORAL SOLUTION
500.0000 mg | Freq: Four times a day (QID) | ORAL | Status: DC
  Filled 2015-07-11: qty 10

## 2015-07-11 MED ORDER — INSULIN ASPART 100 UNIT/ML ~~LOC~~ SOLN
0.0000 [IU] | Freq: Three times a day (TID) | SUBCUTANEOUS | Status: DC
  Administered 2015-07-12 (×2): 9 [IU] via SUBCUTANEOUS
  Administered 2015-07-12: 5 [IU] via SUBCUTANEOUS
  Administered 2015-07-13: 3 [IU] via SUBCUTANEOUS
  Administered 2015-07-13: 9 [IU] via SUBCUTANEOUS
  Administered 2015-07-13: 3 [IU] via SUBCUTANEOUS
  Administered 2015-07-14: 7 [IU] via SUBCUTANEOUS
  Filled 2015-07-11 (×3): qty 9
  Filled 2015-07-11: qty 5
  Filled 2015-07-11: qty 3
  Filled 2015-07-11: qty 7
  Filled 2015-07-11: qty 3

## 2015-07-11 MED ORDER — IPRATROPIUM-ALBUTEROL 0.5-2.5 (3) MG/3ML IN SOLN
3.0000 mL | Freq: Two times a day (BID) | RESPIRATORY_TRACT | Status: DC
  Administered 2015-07-11 – 2015-07-17 (×12): 3 mL via RESPIRATORY_TRACT
  Filled 2015-07-11 (×12): qty 3

## 2015-07-11 MED ORDER — BENZONATATE 100 MG PO CAPS
200.0000 mg | ORAL_CAPSULE | Freq: Three times a day (TID) | ORAL | Status: DC
  Administered 2015-07-11 – 2015-07-17 (×16): 200 mg via ORAL
  Filled 2015-07-11 (×17): qty 2

## 2015-07-11 MED ORDER — LORAZEPAM 0.5 MG PO TABS
0.2500 mg | ORAL_TABLET | ORAL | Status: DC
  Administered 2015-07-11 – 2015-07-15 (×2): 0.25 mg via ORAL
  Filled 2015-07-11 (×3): qty 1

## 2015-07-11 MED ORDER — VANCOMYCIN 50 MG/ML ORAL SOLUTION
125.0000 mg | Freq: Four times a day (QID) | ORAL | Status: DC
  Administered 2015-07-11 – 2015-07-13 (×6): 125 mg via ORAL
  Filled 2015-07-11 (×11): qty 2.5

## 2015-07-11 MED ORDER — MAGIC MOUTHWASH
5.0000 mL | Freq: Three times a day (TID) | ORAL | Status: DC
  Administered 2015-07-11 – 2015-07-16 (×13): 5 mL via ORAL
  Filled 2015-07-11 (×13): qty 10
  Filled 2015-07-11: qty 5
  Filled 2015-07-11: qty 10

## 2015-07-11 MED ORDER — TRAMADOL HCL 50 MG PO TABS
50.0000 mg | ORAL_TABLET | Freq: Two times a day (BID) | ORAL | Status: DC | PRN
  Administered 2015-07-13 – 2015-07-17 (×2): 50 mg via ORAL
  Filled 2015-07-11 (×2): qty 1

## 2015-07-11 MED ORDER — INSULIN ASPART 100 UNIT/ML ~~LOC~~ SOLN
0.0000 [IU] | Freq: Every day | SUBCUTANEOUS | Status: DC
  Administered 2015-07-11: 4 [IU] via SUBCUTANEOUS
  Administered 2015-07-12 – 2015-07-13 (×2): 3 [IU] via SUBCUTANEOUS
  Filled 2015-07-11: qty 3
  Filled 2015-07-11: qty 4
  Filled 2015-07-11: qty 3

## 2015-07-11 MED ORDER — METHYLPREDNISOLONE SODIUM SUCC 125 MG IJ SOLR
60.0000 mg | Freq: Two times a day (BID) | INTRAMUSCULAR | Status: DC
  Administered 2015-07-11 – 2015-07-12 (×2): 60 mg via INTRAVENOUS
  Filled 2015-07-11 (×2): qty 2

## 2015-07-11 NOTE — Consult Note (Signed)
Pharmacy Antibiotic Note  John Robinson is a 82 y.o. male admitted on 07/14/2015 with pneumonia.  Patient is being covered for presumed Cdiff with metronidazole. Pharmacy has been consulted for cefepime dosing. Patient recently completed hospitalization at [facility] and was treated for serratia (sensitive to Zosyn/ceftriaxone) in sputum and blood. Patient received regularly scheduled dialysis on Tuesday, Thursday, Saturday.   Plan: Cefepime 2g IV after dialysis on dialysis days.  Metronidazole 500mg  IV Q8hr.    Height: 6' (182.9 cm) Weight: 150 lb (68.04 kg) IBW/kg (Calculated) : 77.6  Temp (24hrs), Avg:97.6 F (36.4 C), Min:96.1 F (35.6 C), Max:98.5 F (36.9 C)   Recent Labs Lab 06/27/2015 1240 06/28/2015 1530 07/05/2015 1924 07/11/15 0413  WBC 24.7*  --   --  24.2*  CREATININE 6.88*  --   --  8.10*  LATICACIDVEN  --  1.7 1.6  --     Estimated Creatinine Clearance: 6.9 mL/min (by C-G formula based on Cr of 8.1).    Allergies  Allergen Reactions  . Pletal [Cilostazol] Other (See Comments)    Reaction:  Dizziness    Antimicrobials this admission: vancomycin 6/26 >> 6/27 Azithromycin 6/26 >> 6/27 cefepime 6/26 >>  metronidazole 6/27 >> Vancomycin PO 6/27 >>   Dose adjustments this admission:  Microbiology results: 6/26 BCx: pending 6/26 cdiff: positive  6/27 Sputum Cx: pending  6/27 MRSA PCR: pending   Pharmacy will continue to monitor and adjust per consult.    Currie Paris  07/11/2015 3:07 PM

## 2015-07-11 NOTE — Care Management Note (Signed)
Patient is active at [facility] on TTS schedule.  I am sending admission records today and will update with additional records at discharge.  Iran Sizer  Dialysis Coordinator  [phone]

## 2015-07-11 NOTE — Progress Notes (Signed)
[facility] [place] NAME: John Robinson    MR#:  [id number]  DATE OF BIRTH:  Jun 11, 1932  SUBJECTIVE:  CHIEF COMPLAINT:   Chief Complaint  Patient presents with  . Respiratory Distress  SOB, COUGH still there, sitting in chair, has difficulty swallowing (feels has some oral ulcers)  REVIEW OF SYSTEMS:  Review of Systems  Constitutional: Positive for weight loss and malaise/fatigue. Negative for fever and diaphoresis.  HENT: Negative for ear discharge, ear pain, hearing loss, nosebleeds, sore throat and tinnitus.   Eyes: Negative for blurred vision and pain.  Respiratory: Positive for cough and shortness of breath. Negative for hemoptysis and wheezing.   Cardiovascular: Negative for chest pain, palpitations, orthopnea and leg swelling.  Gastrointestinal: Positive for diarrhea. Negative for heartburn, nausea, vomiting, abdominal pain, constipation and blood in stool.  Genitourinary: Negative for dysuria, urgency and frequency.  Musculoskeletal: Negative for myalgias and back pain.  Skin: Negative for itching and rash.  Neurological: Positive for weakness. Negative for dizziness, tingling, tremors, focal weakness, seizures and headaches.  Psychiatric/Behavioral: Negative for depression. The patient is not nervous/anxious.     DRUG ALLERGIES:   Allergies  Allergen Reactions  . Pletal [Cilostazol] Other (See Comments)    Reaction:  Dizziness   VITALS:  Blood pressure 134/59, pulse 85, temperature 97.7 F (36.5 C), temperature source Oral, resp. rate 18, height 6' (1.829 m), weight 72.8 kg (160 lb 7.9 oz), SpO2 99 %. PHYSICAL EXAMINATION:  Physical Exam  Constitutional: He is oriented to person, place, and time. He appears malnourished.  HENT:  Head: Normocephalic and atraumatic.  Eyes: Conjunctivae and EOM are normal. Pupils are equal, round, and reactive to light.  Neck: Normal range of motion. Neck supple. No tracheal deviation  present. No thyromegaly present.  Cardiovascular: Normal rate, regular rhythm and normal heart sounds.   Pulmonary/Chest: Effort normal and breath sounds normal. No respiratory distress. He has no wheezes. He exhibits no tenderness.  Abdominal: Soft. Bowel sounds are normal. He exhibits no distension. There is no tenderness.  Musculoskeletal: Normal range of motion.  Neurological: He is alert and oriented to person, place, and time. No cranial nerve deficit.  Skin: Skin is warm and dry. No rash noted.  Lt IJ perm cath in place  Psychiatric: Mood and affect normal.   LABORATORY PANEL:   CBC  Recent Labs Lab 07/11/15 0413  WBC 24.2*  HGB 11.0*  HCT 33.8*  PLT 257   ------------------------------------------------------------------------------------------------------------------ Chemistries   Recent Labs Lab 07/09/2015 1240 07/11/15 0413  NA 137 139  K 4.1 5.3*  CL 103 106  CO2 21* 18*  GLUCOSE 214* 249*  BUN 101* 212*  CREATININE 6.88* 8.10*  CALCIUM 8.5* 8.4*  AST 17  --   ALT 17  --   ALKPHOS 91  --   BILITOT 0.5  --    RADIOLOGY:  No results found. ASSESSMENT AND PLAN:  1. Clinical sepsis: present on admission due to bilateral pneumonia. continue abx for an aspiration pneumonia   * C. Difficile Colitis - isolation precaution - pharmacy to enter abx orders  2. End-stage renal disease on dialysis Tuesday Thursday and Saturday. Nephrology following 3. History of coronary artery disease. Borderline troponin. Continue aspirin. Troponin likely due to demand ischemia likely secondary to dialysis patient 4. Essential hypertension continue usual medication 5. Peripheral vascular disease on Coumadin. Has supratherapeutic INR. Will hold Coumadin. Give 5 mg Vit K and monitor INR 6. COPD exacerbation:  continue nebulizer treatments Solu-Medrol and budesonide nebulizers. Antibiotics as previously ordered. 7. Dysphagia: continue dysphagia diet and get a swallow evaluation  tomorrow. May need MBS in am. Will add duke magic mouth wash for oral ulcers and some tessalorn pearls for sore throat. 8. Stage II decubiti on buttock: present on admission 9. Chest wound with no signs of infection.     All the records are reviewed and case discussed with Care Management/Social Worker. Management plans discussed with the patient, family and they are in agreement.  CODE STATUS: FULL CODE  TOTAL TIME TAKING CARE OF THIS PATIENT: 35 minutes.   More than 50% of the time was spent in counseling/coordination of care: YES  POSSIBLE D/C IN 1-2 DAYS, DEPENDING ON CLINICAL CONDITION.   [facility], Yancy Knoble M.D on 07/11/2015 at 3:38 PM  Between 7am to 6pm - Pager - [phone]  After 6pm go to [email or web address] - Proofreader  [facility]  Office  [phone]  CC: Primary care physician; Lelon Huh, MD  Note: This dictation was prepared with Dragon dictation along with smaller phrase technology. Any transcriptional errors that result from this process are unintentional.

## 2015-07-11 NOTE — Progress Notes (Signed)
Dr. Manuella Ghazi and Cyril Mourning, Pharmacist, notified of elevated INR 5.26 and PT 46.7; acknowledged; No active bleeding noted overnight; Barbaraann Faster, RN 7:42 AM; 07/11/2015

## 2015-07-11 NOTE — Progress Notes (Signed)
Tx complete  

## 2015-07-11 NOTE — Consult Note (Signed)
PULMONARY / CRITICAL CARE MEDICINE   Name: John Robinson MRN: [id number] DOB: [birth year]    ADMISSION DATE:  07/03/2015  HISTORY: 82 y.o. with end-stage renal disease, coronary artery disease, diabetes, atrial flutter, and COPD who  underwent coronary artery bypass grafting 4 with open vein harvest of left lower extremity on 04/03/2015 at [facility]. He had a PEA arrest on 04/14/2015 and was found to be bacteremic. Source was temporary dialysis catheter; this was replaced, and he was treated with IV antibiotics. He also had Serratia noted on a bronchial culture. For atrial flutter he was anticoagulated with heparin and bridged to warfarin. On 05/01/2015 drainage was noted from inferior portion of sternal incision which ultimately required surgical debridement and wound VAC placement. He was discharged to a nursing facility on 05/20/2015. Presented to [facility] with fatigue/malaise, productive cough and sob, possible multifocal PNA based on imaging, CCM Consulted to PNA and sepsis.    STUDIES:  CT Chest 6/26 > bilateral patchy hazy nodular airspace process right worse than left likely due to infection. Linear atelectasis over the left base. Small amount left pleural fluid. Possible tiny amount of aspirate material over the distal trachea.  SIGNIFICANT EVENTS: 6/26 >productive cough weakness, possible multifocal PNA and acute colitis, admitted to [facility]: Temp:  [96.1 F (35.6 C)-98.5 F (36.9 C)] 96.1 F (35.6 C) (06/27 1303) Pulse Rate:  [53-96] 82 (06/27 1303) Resp:  [16-28] 16 (06/27 1303) BP: (74-142)/(38-76) 119/47 mmHg (06/27 1303) SpO2:  [84 %-100 %] 99 % (06/27 1303) HEMODYNAMICS:   VENTILATOR SETTINGS:   INTAKE / OUTPUT:  Intake/Output Summary (Last 24 hours) at 07/11/15 1428 Last data filed at 07/11/15 0840  Gross per 24 hour  Intake    863 ml  Output      0 ml  Net    863 ml    ROS  Physical Exam   LABS:  CBC  Recent Labs Lab 07/11/2015 1240 07/11/15 0413   WBC 24.7* 24.2*  HGB 11.8* 11.0*  HCT 35.3* 33.8*  PLT 269 257   Coag's  Recent Labs Lab 06/24/2015 1924 07/11/15 0413 07/11/15 1330  INR 4.69* 5.26* 5.67*   BMET  Recent Labs Lab 06/23/2015 1240 07/11/15 0413  NA 137 139  K 4.1 5.3*  CL 103 106  CO2 21* 18*  BUN 101* 212*  CREATININE 6.88* 8.10*  GLUCOSE 214* 249*   Electrolytes  Recent Labs Lab 06/23/2015 1240 07/11/15 0413  CALCIUM 8.5* 8.4*   Sepsis Markers  Recent Labs Lab 06/18/2015 1530 07/02/2015 1924  LATICACIDVEN 1.7 1.6   ABG No results for input(s): PHART, PCO2ART, PO2ART in the last 168 hours. Liver Enzymes  Recent Labs Lab 06/25/2015 1240  AST 17  ALT 17  ALKPHOS 91  BILITOT 0.5  ALBUMIN 2.5*   Cardiac Enzymes  Recent Labs Lab 06/21/2015 1240  TROPONINI 0.11*   Glucose No results for input(s): GLUCAP in the last 168 hours.  Imaging Ct Chest Wo Contrast  06/18/2015  CLINICAL DATA:  Breathing difficulty. Recent CABG and pneumonia at [facility]. Dialysis patient. EXAM: CT CHEST WITHOUT CONTRAST TECHNIQUE: Multidetector CT imaging of the chest was performed following the standard protocol without IV contrast. COMPARISON:  Abdominal CT 03/04/2012 FINDINGS: Left IJ central venous catheter with tip over the cavoatrial junction. Cardiovascular: Sternotomy wires present. Evidence of patient's recent coronary bypass. Suggestion a stent over the anterior descending coronary artery. Mild prominence heart. No significant pericardial collection. Mild atherosclerotic thoracic aorta. Mediastinum: No mediastinal or adenopathy.  Remaining mediastinal structures within normal. Lungs/Pleura: Lungs adequately inflated and demonstrate bilateral patchy hazy nodular airspace process right worse than left likely due to infection. Linear atelectasis over the left base. Small amount left pleural fluid. Possible tiny amount of aspirate material over the distal trachea. Upper Abdomen: Possible subtle cholelithiasis. 1.5 cm  hypodensity over the upper pole left kidney unchanged likely slightly hyperdense cyst. Left renal cortical atrophy unchanged. Colon is only partly visualized although there does appear to be wall thickening/submucosal edema with adjacent inflammatory change in the pericolonic fat suggesting an acute colitis. Partially visualized aortic stent graft. Musculoskeletal: Moderate degenerate changes spine. Minimal loss mid to anterior vertebral body height of a lower thoracic vertebral body unchanged. IMPRESSION: Bilateral patchy hazy nodular airspace process right worse than left likely a pneumonia. Minimal linear atelectasis left base. Small left effusion. Possible tiny amount of aspirate material over distal trachea as consider aspiration pneumonia. Partially visualized colon demonstrating wall thickening/ submucosal edema and adjacent inflammatory change suggesting acute colitis. Postsurgical change compatible recent coronary bypass. Partially abdominal aortic stent graft. Stable 1.5 cm hypodensity over the upper pole left kidney likely slightly hyperdense cyst. Left renal cortical atrophy unchanged. Stable mild compression deformity over the lower thoracic spine. Electronically Signed   By: Marin Olp M.D.   On: 07/14/2015 15:32    LINES: Left IJ dialysis catheter  CULTURES: 6/26 Blood culture>> 6/26 c-diff> Positive  ANTIBIOTICS 6/26> Azithromycin 6/26 6/27 cefepime>> with dialysis 6/26 vancomycin6/27  6/27 Metronidazole 6/27 vancomycin p/o  ASSESSMENT / PLAN:  Bilateral pneumonia C-Diff COPD exacerbation H/O tobacco abuse End-stage renal disease H/o Serratia in blood and sputum  PLAN  Cefipime- presumed serratia/ flagyl - anaerobic coverage/ oral vancomycin- c-diff Follow cultures Continue steroids, taper Continue pulmicort/duoneb Flutter valve/incentive spirometry Rest per primary    Bincy Varughese,AG-ACNP Pulmonary & Critical Care   STAFF NOTE: I, Dr. Vilinda Boehringer have  personally reviewed patient's available data, including medical history, events of note, physical examination and test results as part of my evaluation. I have discussed with NP Varughese  and other care providers such as pharmacist, RN and RRT.  In addition,  I personally evaluated patient and elicited key findings of   HPI:  82 year old male, care medical history to include CABG complicated by PEA arrest followed by sternal wound infection and multiple bouts of Serratia pneumonia and bacteremia since March 2017, also has a history of end-stage renal disease on hemodialysis. Was previously in rehabilitation facility started developing cough, shortness of breath, productive sputum over the past 2 weeks, this progressed to malaise and fatigue also started to have diarrhea almost every hour for the past couple of days. Currently, feeling better today, receiving dialysis at the time of my evaluation  O:  GEN-acute respiratory distress, alert and oriented 3  HEENT-- PERRLA,LIJ Dialysis catheter CVS-s1, s2, no murmurs LUNGS-coarse upper airway sounds, dec basilar sounds, no wheezes ABD-soft, nt, mild distension, +BS MSK-no lesions  A: 82 year old male status post CABG, complicated by PEA arrest, sternotomy wound infection, Serratia bacteremia and pneumonia, discharged to rehabilitation facility now with multifocal pneumonia and C. difficile colitis  Multifocal pneumonia-probable Serratia again C. difficile colitis End-stage renal disease on HD Coronary artery disease status post four-vessel bypass History of recurrent UTIs Hypertension History of bacteremia-Serratia  P:   - Review of records from [facility] shows that the patient had episodes of Serratia bacteremia along with Serratia pneumonia, treated adequately and discharged to rehabilitation facility. -Patient had a chest x-ray followed by  CT chest followed by another chest x-ray this morning, CT chest shows bilateral lower lobe fine  consolidations, possible pneumonia again. I suspect the patient is having another bout of Serratia pneumonia given his recent S/S from the records -We'll treat his current multifocal pneumonia aspiration pneumonia until sputum cultures return, we'll treat with cefepime -Acute colitis-C. difficile colitis positive toxin and antigen, treat with vancomycin and Flagyl -Continue dialysis as scheduled  .  Rest per NP/medical resident whose note is outlined above and that I agree with  The patient is critically ill with multiple organ systems failure and requires high complexity decision making for assessment and support, frequent evaluation and titration of therapies, application of advanced monitoring technologies and extensive interpretation of multiple databases.   Critical Care Time devoted to patient care services described in this note is  45 Minutes.   This time reflects time of care of this signee Dr Vilinda Boehringer.  This critical care time does not reflect procedure time, or teaching time or supervisory time of PA/NP/Med-student/Med Resident etc but could involve care discussion time.  Vilinda Boehringer, MD [place] [facility] Pager [phone] (please enter 7-digits) On Call Pager [phone] (please enter 7-digits)  Note: This note was prepared with Dragon dictation along with smaller phrase technology. Any transcriptional errors that result from this process are unintentional.

## 2015-07-11 NOTE — Consult Note (Signed)
WOC wound consult note Reason for Consult: Nonhealing surgical midline chest incision from CABG.   Stage 2 pressure injury to sacrum Wound type:Surgical Pressure Pressure Ulcer POA: Yes Measurement: Sacrum Stage 2  2 cm x 2 cm x 0.1 cm Midline chest incision 4 cm x 0.2 cm x 0.2 cm open area is 0.8 cm x 0.2 cm x 0.2 cm  Wound [id number] and moist Drainage (amount, consistency, odor) Minimal serosanguinous  No odor.  Periwound:intact Dressing procedure/placement/frequency:Cleanse midline chest incision with NS.  Apply NS moist 2x2 gauze to nonintact area.  Cover with 4x4 gauze and tape.  Change daily.   Cleanse sacrum with soap and water.  Apply Barrier cream twice daily and PRN soilage.  Leave open, per wife.  Will not follow at this time.  Please re-consult if needed.  Domenic Moras RN BSN [place] Pager [phone]

## 2015-07-11 NOTE — Care Management (Signed)
Patient open with [facility] for RN, PT, OT, and aide.  John Robinson with [facility] aware of admission.

## 2015-07-11 NOTE — Consult Note (Signed)
Pharmacy Antibiotic Note  John Robinson is a 82 y.o. male admitted on 06/28/2015 with pneumonia.  Patient is being covered for presumed Cdiff with metronidazole. Pharmacy has been consulted for cefepime dosing. Patient recently completed hospitalization at [facility] and was treated for serratia (sensitive to Zosyn/ceftriaxone) in sputum and blood. Patient received regularly scheduled dialysis on Tuesday, Thursday, Saturday.   Plan: Cefepime 2g IV after dialysis on dialysis days.  Metronidazole 500mg  IV Q8hr.    Vancomycin 125 mg PO q6h for C diff.   Height: 6' (182.9 cm) Weight: 160 lb 7.9 oz (72.8 kg) IBW/kg (Calculated) : 77.6  Temp (24hrs), Avg:97.6 F (36.4 C), Min:96.1 F (35.6 C), Max:98.5 F (36.9 C)   Recent Labs Lab 07/14/2015 1240 07/07/2015 1530 07/14/2015 1924 07/11/15 0413  WBC 24.7*  --   --  24.2*  CREATININE 6.88*  --   --  8.10*  LATICACIDVEN  --  1.7 1.6  --     Estimated Creatinine Clearance: 7.4 mL/min (by C-G formula based on Cr of 8.1).    Allergies  Allergen Reactions  . Pletal [Cilostazol] Other (See Comments)    Reaction:  Dizziness    Antimicrobials this admission: vancomycin 6/26 >> 6/27 Azithromycin 6/26 >> 6/27 cefepime 6/26 >>  metronidazole 6/27 >> Vancomycin PO 6/27 >>   Dose adjustments this admission:  Microbiology results: 6/26 BCx: pending 6/26 cdiff: positive  6/27 Sputum Cx: pending  6/27 MRSA PCR: pending   Pharmacy will continue to monitor and adjust per consult.    Currie Paris  07/11/2015 3:25 PM

## 2015-07-11 NOTE — Progress Notes (Signed)
PT Cancellation Note  Patient Details Name: John Robinson MRN: [id number] DOB: 08-26-1932   Cancelled Treatment:    Reason Eval/Treat Not Completed: Medical issues which prohibited therapy.  Per labs/chart, pt with elevated potassium (5.3) and elevated INR (5.67) today.  Per PT guidelines, will hold PT at this time and re-attempt PT eval at a later date/time as appropriate.   Leitha Bleak 07/11/2015, 2:26 PM Leitha Bleak, [place]

## 2015-07-11 NOTE — Progress Notes (Signed)
Pre dialysis  

## 2015-07-11 NOTE — Progress Notes (Signed)
OT Cancellation Note  Patient Details Name: John Robinson MRN: [id number] DOB: [birth year]   Cancelled Treatment:    Reason Eval/Treat Not Completed: Medical issues which prohibited therapy Per labs/chart, pt with elevated potassium (5.3) and elevated INR (5.67) today. Per OT guidelines, will hold OT at this time and re-attempt eval at a later date/time as appropriate.  Chrys Racer, OTR/L ascom [phone] 07/11/2015, 3:10 PM

## 2015-07-11 NOTE — Progress Notes (Addendum)
ANTICOAGULATION CONSULT NOTE - Follow up [place] [facility] for Warfarin  Indication: VTE prophylaxis  Allergies  Allergen Reactions  . Pletal [Cilostazol] Other (See Comments)    Reaction:  Dizziness    Patient Measurements: Height: 6' (182.9 cm) Weight: 150 lb (68.04 kg) IBW/kg (Calculated) : 77.6 Heparin Dosing Weight:   Vital Signs: Temp: 97.5 F (36.4 C) (06/27 0540) Temp Source: Oral (06/27 0540) BP: 99/44 mmHg (06/27 0929) Pulse Rate: 83 (06/27 0929)  Labs:  Recent Labs  06/28/2015 1240 06/30/2015 1924 07/11/15 0413  HGB 11.8*  --  11.0*  HCT 35.3*  --  33.8*  PLT 269  --  257  LABPROT 39.8* 42.8* 46.7*  INR 4.25* 4.69* 5.26*  CREATININE 6.88*  --  8.10*  TROPONINI 0.11*  --   --     Estimated Creatinine Clearance: 6.9 mL/min (by C-G formula based on Cr of 8.1).  Assessment: Pharmacy consulted to dose warfarin in this 82 year old male admitted with PNA.  Pt was on warfarin 5 mg PO QHS at home.  INR on 6/26 was 4.25.    6/27 INR 5.26 - s/p Vit K 5 mg PO x1 at 0954  Goal of Therapy:  INR 2-3   Plan:  Will continue to hold warfarin until INR trends down to therapeutic range. Will recheck INR daily. CBC also ordered for tomorrow.   Per notes, RN has already informed MD of elevated INR today. RN noted in her note no active bleeding overnight. Per day shift RN, no blood seen and no overt s/sx of bleeding noted.   Pharmacy will continue to follow.   Rayna Sexton L 07/11/2015,10:11 AM

## 2015-07-11 NOTE — Progress Notes (Signed)
Post dialysis 

## 2015-07-11 NOTE — Progress Notes (Signed)
Tx started 

## 2015-07-11 NOTE — Evaluation (Signed)
Clinical/Bedside Swallow Evaluation Patient Details  Name: John Robinson MRN: [id number] Date of Birth: 07-01-1932  Today's Date: 07/11/2015 Time: SLP Start Time (ACUTE ONLY): 70 SLP Stop Time (ACUTE ONLY): 1130 SLP Time Calculation (min) (ACUTE ONLY): 60 min  Past Medical History:  Past Medical History  Diagnosis Date  . Chicken pox   . Measles   . Mumps   . History of MI (myocardial infarction)   . History of malignant melanoma   . History of recurrent UTIs   . Coronary artery disease   . Hypertension   . Chronic kidney disease    Past Surgical History:  Past Surgical History  Procedure Laterality Date  . Rca stent s/p mi  12/05/2000  . Arm surgery Left   . Hip surgery Left   . Appendectomy  1957  . Coronary angioplasty Right 12/22/1996    Right Iliac with stent  . Peripheral vascular catheterization N/A 05/18/2014    Procedure: A/V Shuntogram/Fistulagram;  Surgeon: Algernon Huxley, MD;  Location: [place] [facility];  Service: Cardiovascular;  Laterality: N/A;  . Tympanoplasty Left 11/2008    Left ear/Dr. Tami Ribas  . Aorta-iliac-femoral bypass  02/2012    Aorta-left iliac bypass with iliac right femoral bypass. Repair pseudoanuerysm. Done at [facility]  . Aorta-iliac-femoral bypass  1987  . Melanoma excision  05/25/2002    Chest wall  . Lad and rca stent  02/09/1999  . Peripheral vascular catheterization N/A 06/08/2014    Procedure: Dialysis/Perma Catheter Removal;  Surgeon: Algernon Huxley, MD;  Location: [place] [facility];  Service: Cardiovascular;  Laterality: N/A;  . Peripheral vascular catheterization N/A 10/06/2014    Procedure: A/V Shuntogram/Fistulagram;  Surgeon: Algernon Huxley, MD;  Location: [place] [facility];  Service: Cardiovascular;  Laterality: N/A;  . Peripheral vascular catheterization Left 10/06/2014    Procedure: A/V Shunt Intervention;  Surgeon: Algernon Huxley, MD;  Location: [place] [facility];  Service: Cardiovascular;  Laterality: Left;  . Coronary artery  bypass graft  04/03/2015    4V at [facility]   HPI:  Pt is a 82 y.o. male with a known history of End-stage renal disease, CAD, UTIs, melanoma, HTN, MI. He presented to the ED with shortness of breath and feeling horrible. He stated that he has a sore throat every time he swallows and has been having trouble swallowing d/t this - more so on the Right side. He is having cold chills. Is coughing up greenish phlegm. He is having diarrhea every hour. Had a recent long course at [facility] in March 2017 after having a bypass surgery and then a re-opening and infection of the wound. It was noted he had a Modified Barium Swallow study during that time w/ SILENT aspiration of thin liquids per Radiologist. A PEG placement was done during the hospitalization time as well. He reported he was at rehabilitation for about 6 weeks from May to June and then been home for about 1 week doing "well" eating a regular diet per his and wife's report w/ no s/s of aspiration noted. Currently pt is on a Dysphagia 1 w/ Honey consistency liquids which he states he "will not drink". Per his an wife's report, he had 2 MBSSs following the initial one which cleared him for a regular diet(unable to see those reports in chart). Pt is verbally communicating and follows instructions appropriately. He stated he has been drinking Ensure at home daily and tolerates it "well". He stated the sore throat he feels is  similar to the one he had "before which was ulcers" and potentially Thrush. This has been brought to the MD's attention this morning and MD is ordering something for pt.    Assessment / Plan / Recommendation Clinical Impression  Pt appears to tolerate trials of increased textured foods w/ adequate mastication and oral management of boluses for A-P transfer and clearing b/t trials. Pt did c/o discomfort when swallowing, "a sore throat more on my Right side", which occurs when swallowing anything he says but moreso w/ increased textures. Pt has been  drinking Ensure at home "mostly" w/ decreased appetite for food since Fri/Sat "when it got really bad". Pt stated this had occurred before during his hospitalization/Rehab d/t mouth ulcers and possible Thrush. MD is aware of the current situation and is ordering Medications for such. Due to pt's previous Dysphagia w/ Silent aspiration of thin liquids, SLP recommends a repeat MBSS to assess swallow function b/f diet upgrade to thin liquids; upgrade of food consistency to regular to aid desire for oral intake. Recommend Dietician f/u for drink supplement that meets the consistency of recommended diet and for nutritional support. Recommend frqeuent oral care. ST will f/u in AM w/ MBSS. MD consulted and agreed. Wife/pt agreed w/ poc.    Aspiration Risk   (TBD)    Diet Recommendation  Regular diet w/ Nectar liquids; aspiration precautions. Oral care frequently.  Medication Administration: Whole meds with puree    Other  Recommendations Recommended Consults: Consider ENT evaluation (if sore throat continues) Oral Care Recommendations: Oral care BID;Patient independent with oral care Other Recommendations: Order thickener from pharmacy;Prohibited food (jello, ice cream, thin soups);Remove water pitcher;Have oral suction available   Follow up Recommendations   (TBD)    Frequency and Duration  (TBD)   (TBD)       Prognosis Prognosis for Safe Diet Advancement: Good      Swallow Study   General Date of Onset: 06/25/2015 HPI: Pt is a 82 y.o. male with a known history of End-stage renal disease, CAD, UTIs, melanoma, HTN, MI. He presented to the ED with shortness of breath and feeling horrible. He stated that he has a sore throat every time he swallows and has been having trouble swallowing d/t this - more so on the Right side. He is having cold chills. Is coughing up greenish phlegm. He is having diarrhea every hour. Had a recent long course at [facility] in March 2017 after having a bypass surgery and then a  re-opening and infection of the wound. It was noted he had a Modified Barium Swallow study during that time w/ SILENT aspiration of thin liquids per Radiologist. A PEG placement was done during the hospitalization time as well. He reported he was at rehabilitation for about 6 weeks from May to June and then been home for about 1 week doing "well" eating a regular diet per his and wife's report w/ no s/s of aspiration noted. Currently pt is on a Dysphagia 1 w/ Honey consistency liquids which he states he "will not drink". Per his an wife's report, he had 2 MBSSs following the initial one which cleared him for a regular diet(unable to see those reports in chart). Pt is verbally communicating and follows instructions appropriately. He stated he has been drinking Ensure at home daily and tolerates it "well". He stated the sore throat he feels is similar to the one he had "before which was ulcers" and potentially Thrush. This has been brought to the MD's attention this  morning and MD is ordering something for pt.  Type of Study: Bedside Swallow Evaluation Previous Swallow Assessment: MBSS in 04/2015; report of 2 other MBSSs by pt/wife Diet Prior to this Study: Regular;Thin liquids Temperature Spikes Noted: No (wbc 24.2) Respiratory Status: Room air History of Recent Intubation: No Behavior/Cognition: Alert;Cooperative;Pleasant mood Oral Cavity Assessment: Within Functional Limits Oral Care Completed by SLP: Recent completion by staff Vision: Functional for self-feeding Self-Feeding Abilities: Able to feed self Patient Positioning: Upright in chair Baseline Vocal Quality: Normal Volitional Cough: Strong Volitional Swallow: Able to elicit    Oral/Motor/Sensory Function Overall Oral Motor/Sensory Function: Within functional limits   Ice Chips Ice chips: Not tested   Thin Liquid Thin Liquid: Not tested    Nectar Thick Nectar Thick Liquid: Not tested   Honey Thick Honey Thick Liquid: Not tested   Puree  Puree: Within functional limits Presentation: Self Fed;Spoon (4 trials) Other Comments: discomfort when swallowing   Solid   GO   Solid: Within functional limits (softened) Presentation: Self Fed;Spoon (3 trials) Other Comments: discomfort when swallowing       Orinda Kenner, MS, CCC-SLP   Simrit Gohlke 07/11/2015,11:50 AM

## 2015-07-11 NOTE — Progress Notes (Signed)
Pre Dialysis 

## 2015-07-11 NOTE — Progress Notes (Signed)
MRSA positive; already on contact isolation. Barbaraann Faster, RN 9:23 PM,07/11/2015

## 2015-07-11 NOTE — Progress Notes (Signed)
Inpatient Diabetes Program Recommendations  AACE/ADA: New Consensus Statement on Inpatient Glycemic Control (2015)  Target Ranges:  Prepandial:   less than 140 mg/dL      Peak postprandial:   less than 180 mg/dL (1-2 hours)      Critically ill patients:  140 - 180 mg/dL   Lab Results  Component Value Date   GLUCAP 142* 10/06/2014   HGBA1C 6.4 02/01/2015    Review of Glycemic Control  Results for MACGUIRE, CONQUEST (MRN [id number]) as of 07/11/2015 11:20  Lab glucose  Ref. Range 02/01/2015 09:15 07/07/2015 12:00 06/21/2015 12:40 06/26/2015 14:10 07/11/2015 14:10 06/28/2015 15:30 06/18/2015 19:24 07/11/2015 04:13  Glucose Latest Ref Range: 65-99 mg/dL   214 (H)     249 (H)    Diabetes history: Type 2 Outpatient Diabetes medications: Novolog per sliding scale tid with meals, Glucotrol 5mg /day Current orders for Inpatient glycemic control: none  Inpatient Diabetes Program Recommendations:  Consider ordering fingerstick blood sugar checks tid and hs and start Novolog sensitive correction (0-9 units) tid,  Novolog hs correction 0-5 units.   Per ADA recommendations "consider performing an A1C on all patients with diabetes or hypreglycemia admitted to the hospital if not performed in the prior 3 months".  Gentry Fitz, RN, BA, MHA, CDE Diabetes Coordinator Inpatient Diabetes Program  [phone] (Team Pager) [phone] ([place]) 07/11/2015 11:23 AM

## 2015-07-11 NOTE — Plan of Care (Signed)
Problem: Bowel/Gastric: Goal: Will not experience complications related to bowel motility Outcome: Not Progressing Pt tested positive for C. Diff after 3 loose stools in a 24-hour period.  Dola Argyle, RN

## 2015-07-11 NOTE — Progress Notes (Signed)
Subjective:  Patient admitted overnight for coughing spells and shortness of breath. Also reports sore throat, greenish sputum, cold chills Patient underwent three-vessel bypass and [facility], postop course complicated by sternal wound infection, requires rehabilitation for 6 weeks At home for about one week   Objective:  Vital signs in last 24 hours:  Temp:  [97.5 F (36.4 C)-98.5 F (36.9 C)] 97.5 F (36.4 C) (06/27 0540) Pulse Rate:  [53-96] 83 (06/27 0929) Resp:  [16-28] 16 (06/27 0929) BP: (74-142)/(38-76) 99/44 mmHg (06/27 0929) SpO2:  [84 %-100 %] 98 % (06/27 0929) Weight:  [68.04 kg (150 lb)] 68.04 kg (150 lb) (06/26 1236)  Weight change:  Filed Weights   07/01/2015 1236  Weight: 68.04 kg (150 lb)    Intake/Output:    Intake/Output Summary (Last 24 hours) at 07/11/15 1123 Last data filed at 07/11/15 0840  Gross per 24 hour  Intake    863 ml  Output      0 ml  Net    863 ml     Physical Exam: General: No acute distress, sitting up in chair  HEENT anicteric  Neck supple  Pulm/lungs Mild basilar crackles, normal respiratory effort, room air  CVS/Heart irregular, soft systolic murmur  Abdomen:  Soft, nontender  Extremities: No peripheral edema  Neurologic: Alert, oriented  Skin: bruises over arms  Access: Left IJ PermCath       Basic Metabolic Panel:   Recent Labs Lab 06/15/2015 1240 07/11/15 0413  NA 137 139  K 4.1 5.3*  CL 103 106  CO2 21* 18*  GLUCOSE 214* 249*  BUN 101* 212*  CREATININE 6.88* 8.10*  CALCIUM 8.5* 8.4*     CBC:  Recent Labs Lab 07/09/2015 1240 07/11/15 0413  WBC 24.7* 24.2*  NEUTROABS 21.2*  --   HGB 11.8* 11.0*  HCT 35.3* 33.8*  MCV 85.5 86.8  PLT 269 257      Microbiology:  Recent Results (from the past 720 hour(s))  Blood culture (routine x 2)     Status: None (Preliminary result)   Collection Time: 06/28/2015  2:10 PM  Result Value Ref Range Status   Specimen Description BLOOD RIGHT ASSIST CONTROL  Final    Special Requests BOTTLES DRAWN AEROBIC AND ANAEROBIC [place]  Final   Culture NO GROWTH < 24 HOURS  Final   Report Status PENDING  Incomplete  Blood culture (routine x 2)     Status: None (Preliminary result)   Collection Time: 06/16/2015  2:10 PM  Result Value Ref Range Status   Specimen Description BLOOD RIGHT HAND  Final   Special Requests   Final    BOTTLES DRAWN AEROBIC AND ANAEROBIC [place]   Culture NO GROWTH < 24 HOURS  Final   Report Status PENDING  Incomplete    Coagulation Studies:  Recent Labs  07/14/2015 1240 07/08/2015 1924 07/11/15 0413  LABPROT 39.8* 42.8* 46.7*  INR 4.25* 4.69* 5.26*    Urinalysis: No results for input(s): COLORURINE, LABSPEC, PHURINE, GLUCOSEU, HGBUR, BILIRUBINUR, KETONESUR, PROTEINUR, UROBILINOGEN, NITRITE, LEUKOCYTESUR in the last 72 hours.  Invalid input(s): APPERANCEUR    Imaging: Ct Chest Wo Contrast  07/01/2015  CLINICAL DATA:  Breathing difficulty. Recent CABG and pneumonia at [facility]. Dialysis patient. EXAM: CT CHEST WITHOUT CONTRAST TECHNIQUE: Multidetector CT imaging of the chest was performed following the standard protocol without IV contrast. COMPARISON:  Abdominal CT 03/04/2012 FINDINGS: Left IJ central venous catheter with tip over the cavoatrial junction. Cardiovascular: Sternotomy wires present. Evidence of patient's recent coronary  bypass. Suggestion a stent over the anterior descending coronary artery. Mild prominence heart. No significant pericardial collection. Mild atherosclerotic thoracic aorta. Mediastinum: No mediastinal or adenopathy. Remaining mediastinal structures within normal. Lungs/Pleura: Lungs adequately inflated and demonstrate bilateral patchy hazy nodular airspace process right worse than left likely due to infection. Linear atelectasis over the left base. Small amount left pleural fluid. Possible tiny amount of aspirate material over the distal trachea. Upper Abdomen: Possible subtle cholelithiasis. 1.5 cm  hypodensity over the upper pole left kidney unchanged likely slightly hyperdense cyst. Left renal cortical atrophy unchanged. Colon is only partly visualized although there does appear to be wall thickening/submucosal edema with adjacent inflammatory change in the pericolonic fat suggesting an acute colitis. Partially visualized aortic stent graft. Musculoskeletal: Moderate degenerate changes spine. Minimal loss mid to anterior vertebral body height of a lower thoracic vertebral body unchanged. IMPRESSION: Bilateral patchy hazy nodular airspace process right worse than left likely a pneumonia. Minimal linear atelectasis left base. Small left effusion. Possible tiny amount of aspirate material over distal trachea as consider aspiration pneumonia. Partially visualized colon demonstrating wall thickening/ submucosal edema and adjacent inflammatory change suggesting acute colitis. Postsurgical change compatible recent coronary bypass. Partially abdominal aortic stent graft. Stable 1.5 cm hypodensity over the upper pole left kidney likely slightly hyperdense cyst. Left renal cortical atrophy unchanged. Stable mild compression deformity over the lower thoracic spine. Electronically Signed   By: Marin Olp M.D.   On: 06/18/2015 15:32   Dg Chest Portable 1 View  07/12/2015  CLINICAL DATA:  Breathing difficulty/dyspnea. Dialysis patient. Shortness of breath. EXAM: PORTABLE CHEST 1 VIEW COMPARISON:  12/02/2014 FINDINGS: Interval CABG. Dialysis catheter noted with distal tip at the cavoatrial junction. Atherosclerotic aortic arch. Coronary stents. Right axillary clips. Left axillary stent. Linear subsegmental atelectasis or scarring at the left lung base. Borderline enlargement of the cardiopericardial silhouette. IMPRESSION: 1. Borderline enlargement of the cardiopericardial silhouette but no edema. 2. Interval CABG pre coronary stents noted. 3. Atherosclerotic aortic arch. 4. Left IJ dialysis catheter in place, tip  cavoatrial junction. No pneumothorax. 5. No edema. Electronically Signed   By: Van Clines M.D.   On: 06/23/2015 13:15     Medications:     . acidophilus  1 capsule Oral BID  . antiseptic oral rinse  7 mL Mouth Rinse BID  . aspirin EC  81 mg Oral Daily  . atorvastatin  80 mg Oral QHS  . azithromycin  250 mg Oral Daily  . benzonatate  200 mg Oral TID  . budesonide (PULMICORT) nebulizer solution  0.25 mg Nebulization BID  . ceFEPime (MAXIPIME) IV  2 g Intravenous Q T,Th,Sa-HD  . colesevelam  1,250 mg Oral BID WC  . epoetin (EPOGEN/PROCRIT) injection  4,000 Units Intravenous Q T,Th,Sa-HD  . ipratropium-albuterol  3 mL Nebulization BID  . magic mouthwash  5 mL Oral TID   And  . lidocaine  5 mL Mouth/Throat TID  . LORazepam  0.25 mg Oral Once per day on Tue Thu Sat  . methylPREDNISolone (SOLU-MEDROL) injection  60 mg Intravenous Q8H  . sodium chloride flush  3 mL Intravenous Q12H  . traMADol  50 mg Oral BID  . vancomycin  750 mg Intravenous Q T,Th,Sa-HD   acetaminophen **OR** acetaminophen, lidocaine-prilocaine, nitroGLYCERIN  Assessment/ Plan:  82 y.o. male with end-stage renal disease, hemodialysis [address], Tuesday, Thursday, Saturday, history of severe vascular disease including aortic-iliac bypass,four-vessel CABG at [facility] March 2017, postop wound infection, coronary disease, history of RCA stent, COPD  1. End-stage renal disease.  TuesdayThursday/Saturday.  [address] - we will plan for dialysis today - left arm access needs revision which would be scheduled after acute issues are over. Dialysis via permcath  2. Anemia of chronic kidney disease. Hemoglobin 11.0 at present We'll start Procrit low-dose  3.  Supratherapeutic INR  4. Sepsis with bilateral pneumonia Antibiotics as per primary team.  5.  Secondary hyperparathyroidism Monitor phosphorus during this admission no binders listed on home meds   LOS: 1 Zarin Hagmann 6/27/201711:23  AM

## 2015-07-11 NOTE — Progress Notes (Signed)
Dr. Claria Dice notified of INR 4.69; acknowledged; pharmacy to monitor coumadin dosing. Barbaraann Faster, RN 12:36 AM 07/11/2015

## 2015-07-12 ENCOUNTER — Inpatient Hospital Stay: Payer: Commercial Managed Care - HMO

## 2015-07-12 DIAGNOSIS — E43 Unspecified severe protein-calorie malnutrition: Secondary | ICD-10-CM | POA: Diagnosis present

## 2015-07-12 LAB — BASIC METABOLIC PANEL
Anion gap: 13 (ref 5–15)
BUN: 53 mg/dL — AB (ref 6–20)
CHLORIDE: 98 mmol/L — AB (ref 101–111)
CO2: 24 mmol/L (ref 22–32)
Calcium: 7.7 mg/dL — ABNORMAL LOW (ref 8.9–10.3)
Creatinine, Ser: 4.61 mg/dL — ABNORMAL HIGH (ref 0.61–1.24)
GFR calc Af Amer: 12 mL/min — ABNORMAL LOW (ref >60)
GFR calc non Af Amer: 11 mL/min — ABNORMAL LOW (ref >60)
Glucose, Bld: 357 mg/dL — ABNORMAL HIGH (ref 65–99)
POTASSIUM: 4.9 mmol/L (ref 3.5–5.1)
SODIUM: 135 mmol/L (ref 135–145)

## 2015-07-12 LAB — GLUCOSE, CAPILLARY
GLUCOSE-CAPILLARY: 386 mg/dL — AB (ref 65–99)
GLUCOSE-CAPILLARY: 356 mg/dL — AB (ref 65–99)
GLUCOSE-CAPILLARY: 298 mg/dL — AB (ref 65–99)
GLUCOSE-CAPILLARY: 287 mg/dL — AB (ref 65–99)

## 2015-07-12 LAB — CBC
HCT: 39.3 % — ABNORMAL LOW (ref 40.0–52.0)
HEMOGLOBIN: 12.4 g/dL — AB (ref 13.0–18.0)
MCH: 27.6 pg (ref 26.0–34.0)
MCHC: 31.5 g/dL — ABNORMAL LOW (ref 32.0–36.0)
MCV: 87.7 fL (ref 80.0–100.0)
Platelets: 329 10*3/uL (ref 150–440)
RBC: 4.48 MIL/uL (ref 4.40–5.90)
RDW: 21.2 % — ABNORMAL HIGH (ref 11.5–14.5)
WBC: 36.1 10*3/uL — ABNORMAL HIGH (ref 3.8–10.6)

## 2015-07-12 LAB — PROTIME-INR
INR: 2.95
PROTHROMBIN TIME: 30.2 s — AB (ref 11.4–15.0)

## 2015-07-12 LAB — HEMOGLOBIN A1C: Hgb A1c MFr Bld: 5.9 % (ref 4.0–6.0)

## 2015-07-12 LAB — HEPATITIS B SURFACE ANTIGEN: HEP B S AG: NEGATIVE

## 2015-07-12 LAB — HEPATITIS B SURFACE ANTIBODY, QUANTITATIVE: Hepatitis B-Post: <3.1 m[IU]/mL — ABNORMAL LOW (ref >9.9)

## 2015-07-12 MED ORDER — METHYLPREDNISOLONE SODIUM SUCC 125 MG IJ SOLR: 60.0000 mg | INTRAMUSCULAR | Status: DC

## 2015-07-12 MED ORDER — WARFARIN - PHARMACIST DOSING INPATIENT
Freq: Every day | Status: DC
Start: 2015-07-12 — End: 2015-07-19
  Administered 2015-07-13 – 2015-07-16 (×3)

## 2015-07-12 MED ORDER — CHLORHEXIDINE GLUCONATE CLOTH 2 % EX PADS
6.0000 | MEDICATED_PAD | Freq: Every day | CUTANEOUS | Status: AC
  Administered 2015-07-12 – 2015-07-16 (×5): 6 via TOPICAL

## 2015-07-12 MED ORDER — SODIUM CHLORIDE 0.9 % IV BOLUS (SEPSIS)
500.0000 mL | Freq: Once | INTRAVENOUS | Status: AC
  Administered 2015-07-12: 500 mL via INTRAVENOUS

## 2015-07-12 MED ORDER — WARFARIN SODIUM 2 MG PO TABS
2.0000 mg | ORAL_TABLET | Freq: Once | ORAL | Status: AC
  Administered 2015-07-12: 2 mg via ORAL
  Filled 2015-07-12: qty 1

## 2015-07-12 MED ORDER — SODIUM CHLORIDE 0.9 % IV SOLN
INTRAVENOUS | Status: DC
  Administered 2015-07-12: 18:00:00 via INTRAVENOUS

## 2015-07-12 MED ORDER — MUPIROCIN 2 % EX OINT
1.0000 "application " | TOPICAL_OINTMENT | Freq: Two times a day (BID) | CUTANEOUS | Status: DC
  Administered 2015-07-12 – 2015-07-15 (×8): 1 via NASAL
  Filled 2015-07-12 (×2): qty 22

## 2015-07-12 NOTE — Progress Notes (Signed)
Patient had a witnessed fall at 11:54am on 07/12/2015. Staff was assisting patient to the bathroom and patient's knees buckled. Nurse assistant assisted patient to the floor. Patient sat down on floor. Wife was present in room and observed fall. Patient did not hit their head per nurse assistant, family, and patient. I was notified by staff and notified Dr. Manuella Ghazi regarding patient's fall at 11:58am. No new orders, tests, or scans at this point. Post fall v/s were taken. Patient was short of breath and placed on 4 liters acute O2. Patient states he does not have any pain. Skin assessment completed - no new skin breakdown or bruising noted at this point and will continue to monitor. Charge nurse aware. Post fall flowsheet and huddle completed.

## 2015-07-12 NOTE — Progress Notes (Signed)
BP prior to and post patient fall today was 85/55. Dr. Manuella Ghazi notified. NS 54ml bolus was ordered by Dr. Manuella Ghazi. Patient's blood pressure was 108/55 immediately after bolus finished and 106/54 about 1 hour post bolus. BP is now stable and patient states he feels fine. MD notified. Order for NS@75  continuous.

## 2015-07-12 NOTE — Progress Notes (Signed)
Initial Nutrition Assessment  DOCUMENTATION CODES:   Severe malnutrition in context of acute illness/injury  INTERVENTION:  -Monitor intake and cater to pt preferences within diet restrictions -Will continue to send mightyshake II TID as nectar thick consistency ensure enlive is not.  If renal labs out of normal range may need to switch to nepro.   NUTRITION DIAGNOSIS:   Malnutrition related to acute illness as evidenced by severe depletion of body fat, severe depletion of muscle mass.    GOAL:   Patient will meet greater than or equal to 90% of their needs    MONITOR:   PO intake, Supplement acceptance, Weight trends  REASON FOR ASSESSMENT:   Consult Poor PO  ASSESSMENT:      Pt admitted with bilateral pneumonia, sepsis, c-diff colitis, dysphagia planning MBSS today.  Pt also with chest wound from recent CABG in March. Long stay at [facility] and then [facility] only home 1 week prior to coming to [facility] per wife.  Did have a feeding tube while at [facility] but removed before leaving [facility] per wife. Pt with ESRD on HD  Past Medical History  Diagnosis Date  . Chicken pox   . Measles   . Mumps   . History of MI (myocardial infarction)   . History of malignant melanoma   . History of recurrent UTIs   . Coronary artery disease   . Hypertension   . Chronic kidney disease    Pt reports does not like thicken liquids wants regular water and beverages.  Wife reports pt with poor po intake since March following surgery.  Recently started giving pt ensure drinking 2-3 per day and gaining wt. Wife reports ate good breakfast yesterday am, sweet potato and some of macn cheese for lunch yesterday and yogurt  Medications reviewed:acidophilus, aspart, magic mouthwash, solumedrol Labs reviewed: BUN 53, creatinine 4.61, glucose 357  Nutrition-Focused physical exam completed. Findings are severe fat depletion, moderate to severe muscle depletion, and mild edema.     Diet Order:  Diet  regular Room service appropriate?: Yes with Assist; Fluid consistency:: Nectar Thick  Skin:   (pressure ulcer noted on coccyx)  Last BM:  6/28  Height:   Ht Readings from Last 1 Encounters:  06/29/2015 6' (1.829 m)    Weight: 9% wt loss in the last 5 months  Wt Readings from Last 1 Encounters:  07/11/15 159 lb 6.3 oz (72.3 kg)    Ideal Body Weight:     BMI:  Body mass index is 21.61 kg/(m^2).  Estimated Nutritional Needs:   Kcal:  2160-2520 kcals/d  Protein:  86-108 g/d  Fluid:  >/= 2L/d  EDUCATION NEEDS:   No education needs identified at this time  Savahanna Almendariz B. Zenia Resides, [place], [place] (pager) Weekend/On-Call pager [phone])

## 2015-07-12 NOTE — Evaluation (Signed)
Objective Swallowing Evaluation: Type of Study: MBS-Modified Barium Swallow Study  Patient Details  Name: John Robinson MRN: [id number] Date of Birth: 1932/04/19  Today's Date: 07/12/2015 Time: SLP Start Time (ACUTE ONLY): 1200-SLP Stop Time (ACUTE ONLY): 1300 SLP Time Calculation (min) (ACUTE ONLY): 60 min  Past Medical History:  Past Medical History  Diagnosis Date  . Chicken pox   . Measles   . Mumps   . History of MI (myocardial infarction)   . History of malignant melanoma   . History of recurrent UTIs   . Coronary artery disease   . Hypertension   . Chronic kidney disease    Past Surgical History:  Past Surgical History  Procedure Laterality Date  . Rca stent s/p mi  12/05/2000  . Arm surgery Left   . Hip surgery Left   . Appendectomy  1957  . Coronary angioplasty Right 12/22/1996    Right Iliac with stent  . Peripheral vascular catheterization N/A 05/18/2014    Procedure: A/V Shuntogram/Fistulagram;  Surgeon: Algernon Huxley, MD;  Location: [place] [facility];  Service: Cardiovascular;  Laterality: N/A;  . Tympanoplasty Left 11/2008    Left ear/Dr. Tami Ribas  . Aorta-iliac-femoral bypass  02/2012    Aorta-left iliac bypass with iliac right femoral bypass. Repair pseudoanuerysm. Done at [facility]  . Aorta-iliac-femoral bypass  1987  . Melanoma excision  05/25/2002    Chest wall  . Lad and rca stent  02/09/1999  . Peripheral vascular catheterization N/A 06/08/2014    Procedure: Dialysis/Perma Catheter Removal;  Surgeon: Algernon Huxley, MD;  Location: [place] [facility];  Service: Cardiovascular;  Laterality: N/A;  . Peripheral vascular catheterization N/A 10/06/2014    Procedure: A/V Shuntogram/Fistulagram;  Surgeon: Algernon Huxley, MD;  Location: [place] [facility];  Service: Cardiovascular;  Laterality: N/A;  . Peripheral vascular catheterization Left 10/06/2014    Procedure: A/V Shunt Intervention;  Surgeon: Algernon Huxley, MD;  Location: [place] [facility];  Service:  Cardiovascular;  Laterality: Left;  . Coronary artery bypass graft  04/03/2015    4V at [facility]   HPI: Pt is a 82 y.o. male with a known history of End-stage renal disease, CAD, UTIs, melanoma, HTN, MI. He presented to the ED with shortness of breath and feeling horrible. He stated that he has a sore throat every time he swallows and has been having trouble swallowing d/t this - more so on the Right side. He is having cold chills. Is coughing up greenish phlegm. He is having diarrhea every hour. Had a recent long course at [facility] in March 2017 after having a bypass surgery and then a re-opening and infection of the wound. It was noted he had a Modified Barium Swallow study during that time w/ SILENT aspiration of thin liquids per Radiologist. A PEG placement was done during the hospitalization time as well. He reported he was at rehabilitation for about 6 weeks from May to June and then been home for about 1 week doing "well" eating a regular diet per his and wife's report w/ no s/s of aspiration noted. Currently pt is on a Dysphagia 1 w/ Honey consistency liquids which he states he "will not drink". Per his an wife's report, he had 2 MBSSs following the initial one which cleared him for a regular diet(unable to see those reports in chart). Pt is verbally communicating and follows instructions appropriately. He stated he has been drinking Ensure at home daily and tolerates it "well". He stated  the sore throat he feels is similar to the one he had "before which was ulcers" and potentially Thrush. This has been brought to the MD's attention and medicine ordered. Pt has not been drinking any of the Nectar consistency liquids on trays stating he only wants "water".   Subjective: pt awake, sitting in chair. Verbally responsive.   Subjective: Patient behavior: (alertness, ability to follow instructions, etc.): awake, alert. Verbally responsive. Desires "water" to drink. Chief complaint: dysphagia   Objective:   Radiological Procedure: A videoflouroscopic evaluation of oral-preparatory, reflex initiation, and pharyngeal phases of the swallow was performed; as well as a screening of the upper esophageal phase.  I. POSTURE: upright II. VIEW: lateral III. COMPENSATORY STRATEGIES: none at this eval IV. BOLUSES ADMINISTERED:  Thin Liquid: x6 trials  Nectar-thick Liquid: x1 trial  Honey-thick Liquid: NT  Puree: x3 trials  Mechanical Soft: x1 trial V. RESULTS OF EVALUATION: A. LARYNGEAL PENETRATION: (Material entering into the laryngeal inlet/vestibule but not aspirated): NONE. B. ASPIRATION: x1/6 trials w/ cough response  Assessment / Plan / Recommendation  CHL IP CLINICAL IMPRESSIONS 07/12/2015  Therapy Diagnosis Mild oral phase dysphagia;Mild pharyngeal phase dysphagia  Clinical Impression Pt appears to present w/ Mild+ oropharyngeal phase dysphagia c/b a delayed pharyngeal swallow initiation moreso w/ (thin) liquids w/ aspiration occurring during the swallow x1/6 trials of thin liquids; Nectar consistency liquids appeared to have a slightly more timely pharyngeal swallow initiation result. Pt responded to the aspiration w/ a mildly delayed cough response(strong). No significant pharyngeal residue noted post trials indicating adequate laryngeal excursion and pharyngeal pressure during the swallow. Noted a cervical prominence at [facility] but it did not appear to impede bolus motility or clearing. Suspect the pharyngeal phase of swallowing could have been somewhat impacted by the mild deficits of the oral phase c/b pt's shaky, tremorous, uncoordinated lingual control of boluses during the oral phase w/ premature spillage/bolus loss of liquids into the pharynx. No significant bolus residue remained. Pt appears at increased risk for aspiration moreso of liquids but following strict aspiration precautions, he may be able to tolerate thin liquids via CUP in small, single sips - slowly. Recommend careful monitoring of  pt's Pulmonary status for any decline related to suspected aspiration. Recommend medications be swallowed in puree - Applesauce vs liquids. MD consulted re: results of pt's MBSS and desire for drinking "water" at this time. MD agreed w/ upgrade of diet to thin liquids w/ strict aspiration precautions. Pt and wife educated on precautions; pt agreed. ST will f/u w/ toleration of diet.   Impact on safety and function Mild aspiration risk      CHL IP TREATMENT RECOMMENDATION 07/12/2015  Treatment Recommendations Therapy as outlined in treatment plan below     Prognosis 07/12/2015  Prognosis for Safe Diet Advancement Fair  Barriers to Reach Goals --  Barriers/Prognosis Comment overall weakness    CHL IP DIET RECOMMENDATION 07/12/2015  SLP Diet Recommendations Thin liquid;Regular solids  Liquid Administration via Cup;No straw  Medication Administration Whole meds with puree  Compensations Minimize environmental distractions;Slow rate;Small sips/bites  Postural Changes Seated upright at 90 degrees      CHL IP OTHER RECOMMENDATIONS 07/12/2015  Recommended Consults (No Data)  Oral Care Recommendations Oral care BID;Staff/trained caregiver to provide oral care  Other Recommendations --      CHL IP FOLLOW UP RECOMMENDATIONS 07/12/2015  Follow up Recommendations (No Data)      CHL IP FREQUENCY AND DURATION 07/12/2015  Speech Therapy Frequency (ACUTE ONLY) min 3x week  Treatment Duration 2 weeks           CHL IP ORAL PHASE 07/12/2015  Oral Phase Impaired  Oral - Pudding Teaspoon --  Oral - Pudding Cup --  Oral - Honey Teaspoon --  Oral - Honey Cup --  Oral - Nectar Teaspoon --  Oral - Nectar Cup --  Oral - Nectar Straw --  Oral - Thin Teaspoon --  Oral - Thin Cup --  Oral - Thin Straw --  Oral - Puree --  Oral - Mech Soft --  Oral - Regular --  Oral - Multi-Consistency --  Oral - Pill --  Oral Phase - Comment pt exhibited shaky, tremorous, uncoordinated lingual control of boluses  during the oral phase w/ premature spillage/bolus loss of liquids into the pharynx. No significant bolus residue remained.     CHL IP PHARYNGEAL PHASE 07/12/2015  Pharyngeal Phase Impaired  Pharyngeal- Pudding Teaspoon --  Pharyngeal --  Pharyngeal- Pudding Cup --  Pharyngeal --  Pharyngeal- Honey Teaspoon --  Pharyngeal --  Pharyngeal- Honey Cup --  Pharyngeal --  Pharyngeal- Nectar Teaspoon --  Pharyngeal --  Pharyngeal- Nectar Cup --  Pharyngeal --  Pharyngeal- Nectar Straw --  Pharyngeal --  Pharyngeal- Thin Teaspoon --  Pharyngeal --  Pharyngeal- Thin Cup --  Pharyngeal --  Pharyngeal- Thin Straw --  Pharyngeal --  Pharyngeal- Puree --  Pharyngeal --  Pharyngeal- Mechanical Soft --  Pharyngeal --  Pharyngeal- Regular --  Pharyngeal --  Pharyngeal- Multi-consistency --  Pharyngeal --  Pharyngeal- Pill --  Pharyngeal --  Pharyngeal Comment pt exhibited a delayed pharyngeal swallow initiation moreso w/ thin liquids w/ aspiration occurring during the swallow x1/6 trials. Pt responded w/ a mildly delayed cough response(strong). No significant pharyngeal residue noted post trials indicating adequate laryngeal excursion and pharyngeal pressure during the swallow. Noted cervical prominence at [facility] but it did not appear to impede bolus motility or clearing.      CHL IP CERVICAL ESOPHAGEAL PHASE 07/12/2015  Cervical Esophageal Phase WFL  Pudding Teaspoon --  Pudding Cup --  Honey Teaspoon --  Honey Cup --  Nectar Teaspoon --  Nectar Cup --  Nectar Straw --  Thin Teaspoon --  Thin Cup --  Thin Straw --  Puree --  Mechanical Soft --  Regular --  Multi-consistency --  Pill --  Cervical Esophageal Comment --    No flowsheet data found.   Orinda Kenner, MS, CCC-SLP  Watson,Katherine 07/12/2015, 4:37 PM

## 2015-07-12 NOTE — Progress Notes (Signed)
Subjective:  Patient admitted overnight for coughing spells and shortness of breath. Also reported sore throat, greenish sputum, cold chills Patient underwent three-vessel bypass and [facility], postop course complicated by sternal wound infection, requires rehabilitation for 6 weeks.  At home for about one week Feels extremely weak today.  Tested positive for C. Difficile infection Having significant amount of loose stools Appetite is poor   Objective:  Vital signs in last 24 hours:  Temp:  [96.1 F (35.6 C)-98.2 F (36.8 C)] 96.8 F (36 C) (06/28 0739) Pulse Rate:  [82-99] 90 (06/28 0739) Resp:  [16-22] 17 (06/28 0739) BP: (96-134)/(47-68) 114/55 mmHg (06/28 0739) SpO2:  [95 %-100 %] 100 % (06/28 0739) Weight:  [72.3 kg (159 lb 6.3 oz)-72.8 kg (160 lb 7.9 oz)] 72.3 kg (159 lb 6.3 oz) (06/27 1850)  Weight change: 4.761 kg (10 lb 7.9 oz) Filed Weights   06/17/2015 1236 07/11/15 1515 07/11/15 1850  Weight: 68.04 kg (150 lb) 72.8 kg (160 lb 7.9 oz) 72.3 kg (159 lb 6.3 oz)    Intake/Output:    Intake/Output Summary (Last 24 hours) at 07/12/15 1104 Last data filed at 07/12/15 0649  Gross per 24 hour  Intake    213 ml  Output    500 ml  Net   -287 ml     Physical Exam: General: No acute distress, laying in the bed  HEENT anicteric  Neck supple  Pulm/lungs Mild basilar crackles, normal respiratory effort, room air  CVS/Heart irregular, soft systolic murmur  Abdomen:  Soft, nontender  Extremities: No peripheral edema  Neurologic: Alert, oriented  Skin: bruises over arms  Access: Left IJ PermCath       Basic Metabolic Panel:   Recent Labs Lab 06/15/2015 1240 07/11/15 0413 07/12/15 0603  NA 137 139 135  K 4.1 5.3* 4.9  CL 103 106 98*  CO2 21* 18* 24  GLUCOSE 214* 249* 357*  BUN 101* 212* 53*  CREATININE 6.88* 8.10* 4.61*  CALCIUM 8.5* 8.4* 7.7*     CBC:  Recent Labs Lab 06/22/2015 1240 07/11/15 0413 07/12/15 0603  WBC 24.7* 24.2* 36.1*  NEUTROABS 21.2*  --    --   HGB 11.8* 11.0* 12.4*  HCT 35.3* 33.8* 39.3*  MCV 85.5 86.8 87.7  PLT 269 257 329      Microbiology:  Recent Results (from the past 720 hour(s))  C difficile quick scan w PCR reflex     Status: Abnormal   Collection Time: 07/09/2015  1:45 AM  Result Value Ref Range Status   C Diff antigen POSITIVE (A) NEGATIVE Final   C Diff toxin POSITIVE (A) NEGATIVE Final   C Diff interpretation   Final    Positive for toxigenic C. difficile, active toxin production present.    Comment: CRITICAL RESULT CALLED TO, READ BACK BY AND VERIFIED WITH:  JACK DOUGHERTY AT [id number] 07/11/15 SDR   Blood culture (routine x 2)     Status: None (Preliminary result)   Collection Time: 07/01/2015  2:10 PM  Result Value Ref Range Status   Specimen Description BLOOD RIGHT ASSIST CONTROL  Final   Special Requests BOTTLES DRAWN AEROBIC AND ANAEROBIC [place]  Final   Culture NO GROWTH 2 DAYS  Final   Report Status PENDING  Incomplete  Blood culture (routine x 2)     Status: None (Preliminary result)   Collection Time: 06/17/2015  2:10 PM  Result Value Ref Range Status   Specimen Description BLOOD RIGHT HAND  Final  Special Requests   Final    BOTTLES DRAWN AEROBIC AND ANAEROBIC [place]   Culture NO GROWTH 2 DAYS  Final   Report Status PENDING  Incomplete  MRSA PCR Screening     Status: Abnormal   Collection Time: 07/11/15  7:06 PM  Result Value Ref Range Status   MRSA by PCR POSITIVE (A) NEGATIVE Final    Comment:        The GeneXpert MRSA Assay (FDA approved for NASAL specimens only), is one component of a comprehensive MRSA colonization surveillance program. It is not intended to diagnose MRSA infection nor to guide or monitor treatment for MRSA infections. READ BACK AND VERIFIED BY MARCELLA TURNER AT 2120 07/11/2015.  TFK     Coagulation Studies:  Recent Labs  06/29/2015 1240 07/13/2015 1924 07/11/15 0413 07/11/15 1330 07/12/15 0603  LABPROT 39.8* 42.8* 46.7* 49.4* 30.2*  INR  4.25* 4.69* 5.26* 5.67* 2.95    Urinalysis: No results for input(s): COLORURINE, LABSPEC, PHURINE, GLUCOSEU, HGBUR, BILIRUBINUR, KETONESUR, PROTEINUR, UROBILINOGEN, NITRITE, LEUKOCYTESUR in the last 72 hours.  Invalid input(s): APPERANCEUR    Imaging: Ct Chest Wo Contrast  06/25/2015  CLINICAL DATA:  Breathing difficulty. Recent CABG and pneumonia at [facility]. Dialysis patient. EXAM: CT CHEST WITHOUT CONTRAST TECHNIQUE: Multidetector CT imaging of the chest was performed following the standard protocol without IV contrast. COMPARISON:  Abdominal CT 03/04/2012 FINDINGS: Left IJ central venous catheter with tip over the cavoatrial junction. Cardiovascular: Sternotomy wires present. Evidence of patient's recent coronary bypass. Suggestion a stent over the anterior descending coronary artery. Mild prominence heart. No significant pericardial collection. Mild atherosclerotic thoracic aorta. Mediastinum: No mediastinal or adenopathy. Remaining mediastinal structures within normal. Lungs/Pleura: Lungs adequately inflated and demonstrate bilateral patchy hazy nodular airspace process right worse than left likely due to infection. Linear atelectasis over the left base. Small amount left pleural fluid. Possible tiny amount of aspirate material over the distal trachea. Upper Abdomen: Possible subtle cholelithiasis. 1.5 cm hypodensity over the upper pole left kidney unchanged likely slightly hyperdense cyst. Left renal cortical atrophy unchanged. Colon is only partly visualized although there does appear to be wall thickening/submucosal edema with adjacent inflammatory change in the pericolonic fat suggesting an acute colitis. Partially visualized aortic stent graft. Musculoskeletal: Moderate degenerate changes spine. Minimal loss mid to anterior vertebral body height of a lower thoracic vertebral body unchanged. IMPRESSION: Bilateral patchy hazy nodular airspace process right worse than left likely a pneumonia. Minimal  linear atelectasis left base. Small left effusion. Possible tiny amount of aspirate material over distal trachea as consider aspiration pneumonia. Partially visualized colon demonstrating wall thickening/ submucosal edema and adjacent inflammatory change suggesting acute colitis. Postsurgical change compatible recent coronary bypass. Partially abdominal aortic stent graft. Stable 1.5 cm hypodensity over the upper pole left kidney likely slightly hyperdense cyst. Left renal cortical atrophy unchanged. Stable mild compression deformity over the lower thoracic spine. Electronically Signed   By: Marin Olp M.D.   On: 07/09/2015 15:32   Dg Chest Portable 1 View  07/14/2015  CLINICAL DATA:  Breathing difficulty/dyspnea. Dialysis patient. Shortness of breath. EXAM: PORTABLE CHEST 1 VIEW COMPARISON:  12/02/2014 FINDINGS: Interval CABG. Dialysis catheter noted with distal tip at the cavoatrial junction. Atherosclerotic aortic arch. Coronary stents. Right axillary clips. Left axillary stent. Linear subsegmental atelectasis or scarring at the left lung base. Borderline enlargement of the cardiopericardial silhouette. IMPRESSION: 1. Borderline enlargement of the cardiopericardial silhouette but no edema. 2. Interval CABG pre coronary stents noted. 3. Atherosclerotic aortic arch. 4. Left  IJ dialysis catheter in place, tip cavoatrial junction. No pneumothorax. 5. No edema. Electronically Signed   By: Van Clines M.D.   On: 07/06/2015 13:15     Medications:     . acidophilus  1 capsule Oral BID  . antiseptic oral rinse  7 mL Mouth Rinse BID  . aspirin EC  81 mg Oral Daily  . atorvastatin  80 mg Oral QHS  . benzonatate  200 mg Oral TID  . budesonide (PULMICORT) nebulizer solution  0.25 mg Nebulization BID  . [START ON 07/13/2015] ceFEPime (MAXIPIME) IV  2 g Intravenous Q T,Th,Sa-HD  . Chlorhexidine Gluconate Cloth  6 each Topical Q0600  . colesevelam  1,250 mg Oral BID WC  . epoetin (EPOGEN/PROCRIT)  injection  4,000 Units Intravenous Q T,Th,Sa-HD  . insulin aspart  0-5 Units Subcutaneous QHS  . insulin aspart  0-9 Units Subcutaneous TID WC  . ipratropium-albuterol  3 mL Nebulization BID  . magic mouthwash  5 mL Oral TID   And  . lidocaine  5 mL Mouth/Throat TID  . LORazepam  0.25 mg Oral Once per day on Tue Thu Sat  . methylPREDNISolone (SOLU-MEDROL) injection  60 mg Intravenous Q12H  . metronidazole  500 mg Intravenous Q8H  . mupirocin ointment  1 application Nasal BID  . sodium chloride flush  3 mL Intravenous Q12H  . vancomycin  125 mg Oral Q6H  . warfarin  2 mg Oral ONCE-1800  . Warfarin - Pharmacist Dosing Inpatient   Does not apply q1800   acetaminophen **OR** acetaminophen, lidocaine-prilocaine, nitroGLYCERIN, traMADol  Assessment/ Plan:  82 y.o. male with end-stage renal disease, hemodialysis [address], Tuesday, Thursday, Saturday, history of severe vascular disease including aortic-iliac bypass,four-vessel CABG at [facility] March 2017, postop wound infection, coronary disease, history of RCA stent, COPD  1. End-stage renal disease.  TuesdayThursday/Saturday.  [address] - dialysis as per his regular schedule - left arm access needs revision which would be scheduled after acute issues are over.  - Dialysis via permcath  2. Anemia of chronic kidney disease. Hemoglobin 12.4 at present Hold Procrit   3.  Supratherapeutic INR  4. Sepsis with bilateral pneumonia Antibiotics as per primary team.  5.  Secondary hyperparathyroidism Monitor phosphorus during this admission no binders listed on home meds  6. C. Difficile diarrhea and colitis Currently metronidazole IV, vancomycin oral   LOS: 2 Herberth Deharo 6/28/201711:04 AM

## 2015-07-12 NOTE — Care Management (Signed)
Patient admitted with sepsis/cdiff.  Patient is resting at this time.  History provided by wife who is at bedside.  Patient lives at home with his wife.  Adult son is also available for support.  Wife provides transportation.  Patient has a WC, RW, shower chair, and BCS in the home.  Obtains his medications via [employer]. Denies any issues obtaining medications.  [facility], OT RN, and Aide.  PT consult is pending. Patient currently requiring acute O2.  RNCM following for discharge planning.

## 2015-07-12 NOTE — Progress Notes (Signed)
ANTICOAGULATION CONSULT NOTE - Follow up [facility] for Warfarin  Indication: VTE prophylaxis  Allergies  Allergen Reactions  . Pletal [Cilostazol] Other (See Comments)    Reaction:  Dizziness    Patient Measurements: Height: 6' (182.9 cm) Weight: 159 lb 6.3 oz (72.3 kg) IBW/kg (Calculated) : 77.6 Heparin Dosing Weight:   Vital Signs: Temp: 96.8 F (36 C) (06/28 0739) Temp Source: Axillary (06/28 0739) BP: 114/55 mmHg (06/28 0739) Pulse Rate: 90 (06/28 0739)  Labs:  Recent Labs  06/23/2015 1240  07/11/15 0413 07/11/15 1330 07/12/15 0603  HGB 11.8*  --  11.0*  --  12.4*  HCT 35.3*  --  33.8*  --  39.3*  PLT 269  --  257  --  329  LABPROT 39.8*  < > 46.7* 49.4* 30.2*  INR 4.25*  < > 5.26* 5.67* 2.95  CREATININE 6.88*  --  8.10*  --  4.61*  TROPONINI 0.11*  --   --   --   --   < > = values in this interval not displayed.  Estimated Creatinine Clearance: 12.9 mL/min (by C-G formula based on Cr of 4.61).  Assessment: Pharmacy consulted to dose warfarin in this 82 year old male admitted with PNA.  Pt was on warfarin 5 mg PO QHS at home.  INR on 6/26 was 4.25.   Patient with PNA and C.diff, on cefepime, metronidazole, and oral vancomycin.  Last dose of warfarin PTA on 6/25  6/27 INR 5.26 - s/p Vit K 5 mg PO x1 at 0954 6/28 INR 2.95  Goal of Therapy:  INR 2-3   Plan:  Received vitamin K yesterday for supratherapeutic INR. No overt s/sx of bleeding noted. INR today therapeutic at 2.95  Will resume warfarin today as INR likely to continue trending downward due to vitamin K administration and held doses. Will give substantially reduced dose of 2mg  x 1 today due to concurrent illness and interaction with antibiotics. Expect to see effects of vitamin K for several days.  Will recheck INR daily.  Graham Doukas C 07/12/2015,10:20 AM

## 2015-07-12 NOTE — Progress Notes (Signed)
Inpatient Diabetes Program Recommendations  AACE/ADA: New Consensus Statement on Inpatient Glycemic Control (2015)  Target Ranges:  Prepandial:   less than 140 mg/dL      Peak postprandial:   less than 180 mg/dL (1-2 hours)      Critically ill patients:  140 - 180 mg/dL   Results for John Robinson, John Robinson (MRN [id number]) as of 07/12/2015 14:11  Ref. Range 07/11/2015 18:47 07/11/2015 22:05  Glucose-Capillary Latest Ref Range: 65-99 mg/dL 227 (H) 331 (H)   Results for John Robinson, John Robinson (MRN [id number]) as of 07/12/2015 14:11  Ref. Range 07/12/2015 07:35 07/12/2015 11:46  Glucose-Capillary Latest Ref Range: 65-99 mg/dL 386 (H) 356 (H)    Home DM Meds: Novolog SSI       Glipizide 5 mg daily  Current Insulin Orders: Novolog Sensitive Correction Scale/ SSI (0-9 units) TID AC + HS     -Patient currently receiving IV Solumedrol 60 mg bid  -Glucose levels >300 mg/dl today.    MD- Please consider the following in-hospital insulin adjustments while patient receiving IV steroids:  1. Start Levemir 14 units QHS  2. Increase Novolog Correction Scale/ SSI to Moderate scale (0-15 units) TID AC + HS    --Will follow patient during hospitalization--  Wyn Quaker RN, MSN, CDE Diabetes Coordinator Inpatient Glycemic Control Team Team Pager: [phone] (8a-5p)

## 2015-07-12 NOTE — Care Management Important Message (Signed)
Important Message  Patient Details  Name: John Robinson MRN: [id number] Date of Birth: Nov 10, 1932   Medicare Important Message Given:  Yes    Beverly Sessions, RN 07/12/2015, 10:23 AM

## 2015-07-12 NOTE — Progress Notes (Signed)
OT Cancellation Note  Patient Details Name: John Robinson MRN: [id number] DOB: 09-Sep-1932   Cancelled Treatment:    Reason Eval/Treat Not Completed: Medical issues which prohibited therapy Pt. With elevated Glucose levels this a.m. Will continue to monitor and eval as appropriate.  Harrel Carina, MS, OTR/L  Harrel Carina 07/12/2015, 9:13 AM

## 2015-07-12 NOTE — Consult Note (Signed)
Pharmacy Antibiotic Note  Unkown Defronzo is a 82 y.o. male admitted on 06/19/2015 with pneumonia. MD notes dysphagia and concern for aspiration PNA. Pharmacy has been consulted for cefepime dosing.  Patient recently completed hospitalization at [facility] and was treated for serratia (sensitive to Zosyn/ceftriaxone) in sputum and blood. Patient received regularly scheduled dialysis on Tuesday, Thursday, Saturday.   Plan: Cefepime 2g IV after dialysis on dialysis days.  Metronidazole 500mg  IV Q8hr.    Vancomycin 125 mg PO q6h for C diff.   Height: 6' (182.9 cm) Weight: 159 lb 6.3 oz (72.3 kg) IBW/kg (Calculated) : 77.6  Temp (24hrs), Avg:97.4 F (36.3 C), Min:96.1 F (35.6 C), Max:98.2 F (36.8 C)   Recent Labs Lab 06/15/2015 1240 07/09/2015 1530 06/18/2015 1924 07/11/15 0413 07/12/15 0603  WBC 24.7*  --   --  24.2* 36.1*  CREATININE 6.88*  --   --  8.10* 4.61*  LATICACIDVEN  --  1.7 1.6  --   --     Estimated Creatinine Clearance: 12.9 mL/min (by C-G formula based on Cr of 4.61).    Allergies  Allergen Reactions  . Pletal [Cilostazol] Other (See Comments)    Reaction:  Dizziness    Antimicrobials this admission: vancomycin 6/26 >> 6/27 Azithromycin 6/26 >> 6/27 cefepime 6/26 >>  metronidazole 6/27 >> Vancomycin PO 6/27 >>   Dose adjustments this admission:  Microbiology results: 6/26 BCx: NGTD 6/26 cdiff: positive  6/27 Sputum Cx: pending  6/27 MRSA PCR: positive  Pharmacy will continue to monitor and adjust per consult.    Rexene Edison, PharmD Clinical Pharmacist  07/12/2015 10:41 AM

## 2015-07-12 NOTE — Progress Notes (Signed)
PT Cancellation Note  Patient Details Name: John Robinson MRN: [id number] DOB: 07-11-1932   Cancelled Treatment:    Reason Eval/Treat Not Completed: Medical issues which prohibited therapy. Pt's chart reviewed and currently has a blood glucose level of 357. Per PT protocol pt is inappropriate to participate in therapy at this time. PT will f/u at a later time and complete evaluation when completed.   Neoma Laming, PT, DPT  07/12/2015, 10:57 AM [phone]

## 2015-07-12 NOTE — Progress Notes (Signed)
[place] [facility] [place] NAME: John Robinson    MR#:  [id number]  DATE OF BIRTH:  10/19/1932  SUBJECTIVE:  CHIEF COMPLAINT:   Chief Complaint  Patient presents with  . Respiratory Distress  hypotensive, fell earlier today but no injury REVIEW OF SYSTEMS:  Review of Systems  Constitutional: Positive for weight loss and malaise/fatigue. Negative for fever and diaphoresis.  HENT: Negative for ear discharge, ear pain, hearing loss, nosebleeds, sore throat and tinnitus.   Eyes: Negative for blurred vision and pain.  Respiratory: Positive for cough and shortness of breath. Negative for hemoptysis and wheezing.   Cardiovascular: Negative for chest pain, palpitations, orthopnea and leg swelling.  Gastrointestinal: Positive for diarrhea. Negative for heartburn, nausea, vomiting, abdominal pain, constipation and blood in stool.  Genitourinary: Negative for dysuria, urgency and frequency.  Musculoskeletal: Negative for myalgias and back pain.  Skin: Negative for itching and rash.  Neurological: Positive for weakness. Negative for dizziness, tingling, tremors, focal weakness, seizures and headaches.  Psychiatric/Behavioral: Negative for depression. The patient is not nervous/anxious.    DRUG ALLERGIES:   Allergies  Allergen Reactions  . Pletal [Cilostazol] Other (See Comments)    Reaction:  Dizziness   VITALS:  Blood pressure 137/48, pulse 93, temperature 96.9 F (36.1 C), temperature source Axillary, resp. rate 18, height 6' (1.829 m), weight 72.3 kg (159 lb 6.3 oz), SpO2 100 %. PHYSICAL EXAMINATION:  Physical Exam  Constitutional: He is oriented to person, place, and time. He appears malnourished.  HENT:  Head: Normocephalic and atraumatic.  Eyes: Conjunctivae and EOM are normal. Pupils are equal, round, and reactive to light.  Neck: Normal range of motion. Neck supple. No tracheal deviation present. No thyromegaly present.  Cardiovascular:  Normal rate, regular rhythm and normal heart sounds.   Pulmonary/Chest: Effort normal and breath sounds normal. No respiratory distress. He has no wheezes. He exhibits no tenderness.  Abdominal: Soft. Bowel sounds are normal. He exhibits no distension. There is no tenderness.  Musculoskeletal: Normal range of motion.  Neurological: He is alert and oriented to person, place, and time. No cranial nerve deficit.  Skin: Skin is warm and dry. No rash noted.  Lt IJ perm cath in place  Psychiatric: Mood and affect normal.   LABORATORY PANEL:   CBC  Recent Labs Lab 07/12/15 0603  WBC 36.1*  HGB 12.4*  HCT 39.3*  PLT 329   ------------------------------------------------------------------------------------------------------------------ Chemistries   Recent Labs Lab 07/05/2015 1240  07/12/15 0603  NA 137  < > 135  K 4.1  < > 4.9  CL 103  < > 98*  CO2 21*  < > 24  GLUCOSE 214*  < > 357*  BUN 101*  < > 53*  CREATININE 6.88*  < > 4.61*  CALCIUM 8.5*  < > 7.7*  AST 17  --   --   ALT 17  --   --   ALKPHOS 91  --   --   BILITOT 0.5  --   --   < > = values in this interval not displayed. RADIOLOGY:  No results found. ASSESSMENT AND PLAN:  1. Clinical sepsis: present on admission due to bilateral pneumonia. Continue cefepime for an aspiration pneumonia   * C. Difficile Colitis - isolation precaution - Vanco PO Q 6 hrs - Flagyl 500 mg Q 8 hrs  2. End-stage renal disease on dialysis Tuesday Thursday and Saturday. Nephrology following 3. History of coronary artery disease. Borderline troponin.  Continue aspirin. Troponin likely due to demand ischemia likely secondary to dialysis patient 4. Hypotension: fluid bolus and monitor,  5. Peripheral vascular disease on Coumadin. Has supratherapeutic INR. Will hold Coumadin. Give 5 mg Vit K and monitor INR, repeat today is 2.95 6. COPD exacerbation: continue nebulizer treatments Solu-Medrol and budesonide nebulizers. Antibiotics as previously  ordered. 7. Dysphagia: continue dysphagia diet. Appreciate ST input 8. Stage II decubiti on buttock: present on admission 9. Chest wound with no signs of infection.  Fell earlier today but no injury, monitor.  If hypotension continues, may need step-down  All the records are reviewed and case discussed with Care Management/Social Worker. Management plans discussed with the patient, family and they are in agreement.  CODE STATUS: FULL CODE, Palliative care c/s  TOTAL TIME TAKING CARE OF THIS PATIENT: 35 minutes.   More than 50% of the time was spent in counseling/coordination of care: YES  POSSIBLE D/C IN 2-3 DAYS, DEPENDING ON CLINICAL CONDITION.   [facility], Marisal Swarey M.D on 07/12/2015 at 4:57 PM  Between 7am to 6pm - Pager - [phone]  After 6pm go to [email or web address] - Proofreader  [facility] [place] [facility]  Office  [phone]  CC: Primary care physician; Lelon Huh, MD  Note: This dictation was prepared with Dragon dictation along with smaller phrase technology. Any transcriptional errors that result from this process are unintentional.

## 2015-07-13 ENCOUNTER — Inpatient Hospital Stay: Payer: Commercial Managed Care - HMO

## 2015-07-13 DIAGNOSIS — R627 Adult failure to thrive: Secondary | ICD-10-CM

## 2015-07-13 DIAGNOSIS — Z515 Encounter for palliative care: Secondary | ICD-10-CM

## 2015-07-13 DIAGNOSIS — Z7189 Other specified counseling: Secondary | ICD-10-CM

## 2015-07-13 DIAGNOSIS — I959 Hypotension, unspecified: Secondary | ICD-10-CM

## 2015-07-13 DIAGNOSIS — J9811 Atelectasis: Secondary | ICD-10-CM

## 2015-07-13 LAB — PROTIME-INR
INR: 3.78
Prothrombin Time: 36.4 seconds — ABNORMAL HIGH (ref 11.4–15.0)

## 2015-07-13 LAB — GLUCOSE, CAPILLARY
GLUCOSE-CAPILLARY: 222 mg/dL — AB (ref 65–99)
Glucose-Capillary: 372 mg/dL — ABNORMAL HIGH (ref 65–99)
Glucose-Capillary: 264 mg/dL — ABNORMAL HIGH (ref 65–99)

## 2015-07-13 LAB — CBC
HEMATOCRIT: 43.9 % (ref 40.0–52.0)
HEMOGLOBIN: 14 g/dL (ref 13.0–18.0)
MCH: 28 pg (ref 26.0–34.0)
MCHC: 31.8 g/dL — ABNORMAL LOW (ref 32.0–36.0)
MCV: 88.1 fL (ref 80.0–100.0)
PLATELETS: 303 10*3/uL (ref 150–440)
RBC: 4.98 MIL/uL (ref 4.40–5.90)
RDW: 21 % — ABNORMAL HIGH (ref 11.5–14.5)
WBC: 44.2 10*3/uL — AB (ref 3.8–10.6)

## 2015-07-13 LAB — BASIC METABOLIC PANEL
ANION GAP: 14 (ref 5–15)
BUN: 73 mg/dL — ABNORMAL HIGH (ref 6–20)
CHLORIDE: 102 mmol/L (ref 101–111)
CO2: 22 mmol/L (ref 22–32)
Calcium: 7.7 mg/dL — ABNORMAL LOW (ref 8.9–10.3)
Creatinine, Ser: 6.04 mg/dL — ABNORMAL HIGH (ref 0.61–1.24)
GFR calc Af Amer: 9 mL/min — ABNORMAL LOW (ref >60)
GFR, EST NON AFRICAN AMERICAN: 8 mL/min — AB (ref >60)
GLUCOSE: 394 mg/dL — AB (ref 65–99)
POTASSIUM: 5.4 mmol/L — AB (ref 3.5–5.1)
Sodium: 138 mmol/L (ref 135–145)

## 2015-07-13 MED ORDER — SODIUM CHLORIDE 0.9 % IV BOLUS (SEPSIS)
500.0000 mL | Freq: Once | INTRAVENOUS | Status: AC
  Administered 2015-07-13: 500 mL via INTRAVENOUS

## 2015-07-13 MED ORDER — DOPAMINE-DEXTROSE 3.2-5 MG/ML-% IV SOLN
2.5000 ug/kg/min | INTRAVENOUS | Status: DC
  Administered 2015-07-15: 5 ug/kg/min via INTRAVENOUS
  Filled 2015-07-13 (×2): qty 250

## 2015-07-13 MED ORDER — ONDANSETRON HCL 4 MG/2ML IJ SOLN
INTRAMUSCULAR | Status: AC
Start: 2015-07-13 — End: 2015-07-13
  Filled 2015-07-13: qty 2

## 2015-07-13 MED ORDER — SODIUM CHLORIDE 0.9 % IV SOLN
INTRAVENOUS | Status: DC
  Administered 2015-07-13: 15:00:00 via INTRAVENOUS

## 2015-07-13 MED ORDER — MORPHINE SULFATE (PF) 2 MG/ML IV SOLN
2.0000 mg | INTRAVENOUS | Status: DC | PRN
  Administered 2015-07-13 – 2015-07-17 (×7): 2 mg via INTRAVENOUS
  Filled 2015-07-13 (×8): qty 1

## 2015-07-13 MED ORDER — VANCOMYCIN 50 MG/ML ORAL SOLUTION
500.0000 mg | Freq: Four times a day (QID) | ORAL | Status: DC
  Administered 2015-07-13 – 2015-07-20 (×26): 500 mg via ORAL
  Filled 2015-07-13 (×32): qty 10

## 2015-07-13 MED ORDER — ONDANSETRON HCL 4 MG/2ML IJ SOLN
4.0000 mg | Freq: Four times a day (QID) | INTRAMUSCULAR | Status: DC | PRN
  Administered 2015-07-13 – 2015-07-16 (×4): 4 mg via INTRAVENOUS
  Filled 2015-07-13: qty 2

## 2015-07-13 MED ORDER — ONDANSETRON HCL 4 MG/2ML IJ SOLN
4.0000 mg | Freq: Four times a day (QID) | INTRAMUSCULAR | Status: DC
Start: 2015-07-13 — End: 2015-07-19
  Administered 2015-07-13 – 2015-07-19 (×23): 4 mg via INTRAVENOUS
  Filled 2015-07-13 (×26): qty 2

## 2015-07-13 MED ORDER — ENSURE ENLIVE PO LIQD
237.0000 mL | Freq: Three times a day (TID) | ORAL | Status: DC
  Administered 2015-07-13 – 2015-07-17 (×8): 237 mL via ORAL

## 2015-07-13 MED ORDER — METHYLPREDNISOLONE SODIUM SUCC 40 MG IJ SOLR
20.0000 mg | INTRAMUSCULAR | Status: DC
  Administered 2015-07-13 – 2015-07-17 (×5): 20 mg via INTRAVENOUS
  Filled 2015-07-13 (×5): qty 1

## 2015-07-13 MED ORDER — INSULIN GLARGINE 100 UNIT/ML ~~LOC~~ SOLN
6.0000 [IU] | Freq: Every day | SUBCUTANEOUS | Status: DC
  Administered 2015-07-13: 6 [IU] via SUBCUTANEOUS
  Filled 2015-07-13 (×2): qty 0.06

## 2015-07-13 NOTE — Progress Notes (Signed)
HD started. 

## 2015-07-13 NOTE — Progress Notes (Signed)
PRE HD assessment 

## 2015-07-13 NOTE — Progress Notes (Signed)
Post HD  

## 2015-07-13 NOTE — Consult Note (Signed)
PULMONARY / CRITICAL CARE MEDICINE   Name: John Robinson MRN: [id number] DOB: 1932/07/29    ADMISSION DATE:  06/23/2015  HISTORY: 81 y.o. with end-stage renal disease, coronary artery disease, diabetes, atrial flutter, and COPD who  underwent coronary artery bypass grafting 4 with open vein harvest of left lower extremity on 04/03/2015 at [facility]. He had a PEA arrest on 04/14/2015 and was found to be bacteremic. Source was temporary dialysis catheter; this was replaced, and he was treated with IV antibiotics. He also had Serratia noted on a bronchial culture. For atrial flutter he was anticoagulated with heparin and bridged to warfarin. On 05/01/2015 drainage was noted from inferior portion of sternal incision which ultimately required surgical debridement and wound VAC placement. He was discharged to a nursing facility on 05/20/2015. Presented to [facility] with fatigue/malaise, productive cough and sob, possible multifocal PNA based on imaging, CCM Consulted to PNA and sepsis.    SUBJECTIVE: Pt with soft BP (sys 90s) today, had multiple episodes of vomiting and diarrhea overnight and this morning.    STUDIES:  CT Chest 6/26 > bilateral patchy hazy nodular airspace process right worse than left likely due to infection. Linear atelectasis over the left base. Small amount left pleural fluid. Possible tiny amount of aspirate material over the distal trachea.  SIGNIFICANT EVENTS: 6/26 >productive cough weakness, possible multifocal PNA and acute colitis, admitted to [place]: Temp:  [96.9 F (36.1 C)-97.9 F (36.6 C)] 97.1 F (36.2 C) (06/29 0836) Pulse Rate:  [65-110] 68 (06/29 0836) Resp:  [18-22] 20 (06/29 0836) BP: (85-137)/(46-91) 93/56 mmHg (06/29 0836) SpO2:  [79 %-100 %] 96 % (06/29 0906) HEMODYNAMICS:   VENTILATOR SETTINGS:   INTAKE / OUTPUT:  Intake/Output Summary (Last 24 hours) at 07/13/15 0947 Last data filed at 07/13/15 0626  Gross per 24 hour  Intake   1262 ml   Output    150 ml  Net   1112 ml    Review of Systems  Constitutional: Positive for weight loss. Negative for fever.  Respiratory: Negative for cough, hemoptysis, sputum production, shortness of breath and wheezing.   Cardiovascular: Negative for chest pain.  Gastrointestinal: Positive for nausea, vomiting, abdominal pain and diarrhea. Negative for blood in stool.  Genitourinary: Negative for dysuria.  Musculoskeletal: Positive for myalgias.  Skin: Negative for rash.  Neurological: Positive for dizziness.  Endo/Heme/Allergies: Does not bruise/bleed easily.    Physical Exam  Constitutional: He is oriented to person, place, and time and well-developed, well-nourished, and in no distress.  Ill appearing  HENT:  Head: Normocephalic and atraumatic.  Eyes: EOM are normal. Pupils are equal, round, and reactive to light.  Neck: Normal range of motion. Neck supple.  Cardiovascular: Normal rate, regular rhythm and normal heart sounds.   Pulmonary/Chest: Effort normal and breath sounds normal. No respiratory distress. He has no wheezes. He has no rales. He exhibits no tenderness.  Abdominal: Soft. He exhibits no mass. There is tenderness. There is no rebound and no guarding.  Musculoskeletal: Normal range of motion.  Neurological: He is alert and oriented to person, place, and time.  Nursing note and vitals reviewed.    LABS:  CBC  Recent Labs Lab 07/11/15 0413 07/12/15 0603 07/13/15 0625  WBC 24.2* 36.1* 44.2*  HGB 11.0* 12.4* 14.0  HCT 33.8* 39.3* 43.9  PLT 257 329 303   Coag's  Recent Labs Lab 07/11/15 1330 07/12/15 0603 07/13/15 0625  INR 5.67* 2.95 3.78   BMET  Recent Labs Lab  07/11/15 0413 07/12/15 0603 07/13/15 0625  NA 139 135 138  K 5.3* 4.9 5.4*  CL 106 98* 102  CO2 18* 24 22  BUN 212* 53* 73*  CREATININE 8.10* 4.61* 6.04*  GLUCOSE 249* 357* 394*   Electrolytes  Recent Labs Lab 07/11/15 0413 07/12/15 0603 07/13/15 0625  CALCIUM 8.4* 7.7*  7.7*   Sepsis Markers  Recent Labs Lab 07/14/2015 1530 06/17/2015 1924  LATICACIDVEN 1.7 1.6   ABG No results for input(s): PHART, PCO2ART, PO2ART in the last 168 hours. Liver Enzymes  Recent Labs Lab 07/02/2015 1240  AST 17  ALT 17  ALKPHOS 91  BILITOT 0.5  ALBUMIN 2.5*   Cardiac Enzymes  Recent Labs Lab 07/14/2015 1240  TROPONINI 0.11*   Glucose  Recent Labs Lab 07/11/15 2205 07/12/15 0735 07/12/15 1146 07/12/15 1651 07/12/15 2112 07/13/15 0808  GLUCAP 331* 386* 356* 298* 287* 372*    Imaging Dg Chest 1 View  07/13/2015  CLINICAL DATA:  Increased SOB, Recent heart surgery 3-4 weeks ago, hx MI, CAD, HTN EXAM: CHEST 1 VIEW COMPARISON:  07/14/2015 FINDINGS: Left-sided dialysis catheter tips overlie the lower superior vena cava and upper right atrium. The heart is enlarged. Status post median sternotomy and CABG. There is new opacification at the left lung base which completely obscures the left hemidiaphragm. No pulmonary edema. Small bilateral pleural effusions are present. IMPRESSION: 1. New opacification of the left lower lobe consistent with atelectasis or infiltrate. 2. Bilateral small effusions. Electronically Signed   By: Nolon Nations M.D.   On: 07/13/2015 09:13   Dg Abd Portable 2v  07/13/2015  CLINICAL DATA:  Diffuse abd pain, pt states he had heart 3-4 weeks ago EXAM: PORTABLE ABDOMEN - 2 VIEW COMPARISON:  06/15/2015 chest x-ray FINDINGS: Status post median sternotomy. Partially imaged dialysis catheter tips overlie the upper right atrium. There is dense opacity at the left lung base, obscuring the left hemidiaphragm, increased over prior. Contrast is identified within the stomach. Aortic stent graft is present. Bowel gas pattern is nonobstructed. No evidence for free intraperitoneal air. IMPRESSION: New opacification of the left lower lobe of the lung. Nonobstructive bowel gas pattern. Electronically Signed   By: Nolon Nations M.D.   On: 07/13/2015 09:12     LINES: Left IJ dialysis catheter  CULTURES: 6/26 Blood culture>> 6/26 c-diff> Positive  ANTIBIOTICS 6/26> Azithromycin 6/26 6/27 cefepime>> with dialysis>6/28 6/26 vancomycin6/27  6/27 Metronidazole 6/27 vancomycin p/o  ASSESSMENT / PLAN: A: 82 year old male status post CABG, complicated by PEA arrest, sternotomy wound infection, Serratia bacteremia and pneumonia, discharged to rehabilitation facility now with multifocal pneumonia and C. difficile colitis  Multifocal pneumonia-probable Serratia again-resolving C. difficile colitis End-stage renal disease on HD Coronary artery disease status post four-vessel bypass History of recurrent UTIs Hypertension History of bacteremia-Serratia Hypotension  P:   - Review of records from [facility] shows that the patient had episodes of Serratia bacteremia along with Serratia pneumonia, treated adequately and discharged to rehabilitation facility.  At this time cefepime can be stopped as cdiff colitis is causing most of the clinical symptoms.  -AXR and CXR  Reviewed - this is more likely atelectasis in the LLL.  At this time hold off on adding back cefepime (this is not helping is cdiff treatment).  If fever or worsening respiratory status develops then can add back cefepime to treat suspected PNA.   -Acute colitis-C. difficile colitis positive toxin and antigen, treat with vancomycin and Flagyl.   Will increase vanc dose  to treat severe cdfiff   Critical Care Time devoted to patient care services described in this note is  45 Minutes.   This time reflects time of care of this signee Dr Vilinda Boehringer.  This critical care time does not reflect procedure time, or teaching time or supervisory time of PA/NP/Med-student/Med Resident etc but could involve care discussion time.  Vilinda Boehringer, MD [facility] Pager [phone] (please enter 7-digits) On Call Pager [phone] (please enter 7-digits)  Note:  This note was prepared with Dragon dictation along with smaller phrase technology. Any transcriptional errors that result from this process are unintentional.

## 2015-07-13 NOTE — Progress Notes (Signed)
TX ended.    

## 2015-07-13 NOTE — Progress Notes (Signed)
PT Cancellation Note  Patient Details Name: John Robinson MRN: [id number] DOB: February 15, 1932   Cancelled Treatment:    Reason Eval/Treat Not Completed: Medical issues which prohibited therapy. Chart reviewed. Most current BP readings at 1100 pt is hypotensive with BP of 83/52. Pt suffered fall yesterday with staff and was also hypotensive at that time. Pt is currently contraindicated for physical therapy due to hypotension. Will continue to monitor and attempt PT evaluation once pt has stabilized.   Lyndel Safe Huprich PT, DPT   Huprich,Jason 07/13/2015, 11:17 AM

## 2015-07-13 NOTE — Progress Notes (Signed)
Patient ID: John Robinson, male   DOB: 12-24-1932, 82 y.o.   MRN: [id number] [employer] PROGRESS NOTE  John Robinson [id number] DOB: 08/09/1932 DOA: 06/21/2015 PCP: John Huh, MD  HPI/Subjective: Patient is actively vomiting this morning. Had 6 bowel movements yesterday. Started vomiting last night. Having abdominal pain entire abdomen.  Objective: Filed Vitals:   07/13/15 0457 07/13/15 0510  BP: 124/63   Pulse: 87 65  Temp: 97.6 F (36.4 C)   Resp: 22     Filed Weights   07/11/2015 1236 07/11/15 1515 07/11/15 1850  Weight: 68.04 kg (150 lb) 72.8 kg (160 lb 7.9 oz) 72.3 kg (159 lb 6.3 oz)    ROS: Review of Systems  Constitutional: Negative for fever and chills.  Eyes: Negative for blurred vision.  Respiratory: Positive for shortness of breath. Negative for cough.   Cardiovascular: Negative for chest pain.  Gastrointestinal: Positive for nausea, vomiting, abdominal pain and diarrhea. Negative for constipation.  Genitourinary: Negative for dysuria.  Musculoskeletal: Negative for joint pain.  Neurological: Negative for dizziness and headaches.   Exam: Physical Exam  Constitutional: He is oriented to person, place, and time.  HENT:  Nose: No mucosal edema.  Mouth/Throat: No oropharyngeal exudate or posterior oropharyngeal edema.  Eyes: Conjunctivae, EOM and lids are normal. Pupils are equal, round, and reactive to light.  Neck: No JVD present. Carotid bruit is not present. No edema present. No thyroid mass and no thyromegaly present.  Cardiovascular: S1 normal and S2 normal.  Exam reveals no gallop.   No murmur heard. Pulses:      Dorsalis pedis pulses are 2+ on the right side, and 2+ on the left side.  Respiratory: No respiratory distress. He has wheezes in the right lower field. He has no rhonchi. He has no rales.  GI: Soft. Bowel sounds are normal. There is generalized tenderness.  Musculoskeletal:       Right ankle: He exhibits no swelling.   Left ankle: He exhibits no swelling.  Lymphadenopathy:    He has no cervical adenopathy.  Neurological: He is alert and oriented to person, place, and time. No cranial nerve deficit.  Skin: Skin is warm. No rash noted. Nails show no clubbing.  Bruising upper and lower extremities.  Psychiatric: He has a normal mood and affect.      Data Reviewed: Basic Metabolic Panel:  Recent Labs Lab 07/03/2015 1240 07/11/15 0413 07/12/15 0603 07/13/15 0625  NA 137 139 135 138  K 4.1 5.3* 4.9 5.4*  CL 103 106 98* 102  CO2 21* 18* 24 22  GLUCOSE 214* 249* 357* 394*  BUN 101* 212* 53* 73*  CREATININE 6.88* 8.10* 4.61* 6.04*  CALCIUM 8.5* 8.4* 7.7* 7.7*   Liver Function Tests:  Recent Labs Lab 06/30/2015 1240  AST 17  ALT 17  ALKPHOS 91  BILITOT 0.5  PROT 5.9*  ALBUMIN 2.5*   CBC:  Recent Labs Lab 07/03/2015 1240 07/11/15 0413 07/12/15 0603 07/13/15 0625  WBC 24.7* 24.2* 36.1* 44.2*  NEUTROABS 21.2*  --   --   --   HGB 11.8* 11.0* 12.4* 14.0  HCT 35.3* 33.8* 39.3* 43.9  MCV 85.5 86.8 87.7 88.1  PLT 269 257 329 303   Cardiac Enzymes:  Recent Labs Lab 07/09/2015 1240  TROPONINI 0.11*   BNP (last 3 results)  Recent Labs  07/09/2015 1240  BNP 227.0*     CBG:  Recent Labs Lab 07/12/15 0735 07/12/15 1146 07/12/15 1651 07/12/15 2112 07/13/15 0808  GLUCAP  386* 356* 298* 287* 372*    Recent Results (from the past 240 hour(s))  C difficile quick scan w PCR reflex     Status: Abnormal   Collection Time: 06/15/2015  1:45 AM  Result Value Ref Range Status   C Diff antigen POSITIVE (A) NEGATIVE Final   C Diff toxin POSITIVE (A) NEGATIVE Final   C Diff interpretation   Final    Positive for toxigenic C. difficile, active toxin production present.    Comment: CRITICAL RESULT CALLED TO, READ BACK BY AND VERIFIED WITH:  JACK DOUGHERTY AT 1458 07/11/15 SDR   Blood culture (routine x 2)     Status: None (Preliminary result)   Collection Time: 07/09/2015  2:10 PM  Result  Value Ref Range Status   Specimen Description BLOOD RIGHT ASSIST CONTROL  Final   Special Requests BOTTLES DRAWN AEROBIC AND ANAEROBIC [place]  Final   Culture NO GROWTH 3 DAYS  Final   Report Status PENDING  Incomplete  Blood culture (routine x 2)     Status: None (Preliminary result)   Collection Time: 07/08/2015  2:10 PM  Result Value Ref Range Status   Specimen Description BLOOD RIGHT HAND  Final   Special Requests   Final    BOTTLES DRAWN AEROBIC AND ANAEROBIC [place]   Culture NO GROWTH 3 DAYS  Final   Report Status PENDING  Incomplete  MRSA PCR Screening     Status: Abnormal   Collection Time: 07/11/15  7:06 PM  Result Value Ref Range Status   MRSA by PCR POSITIVE (A) NEGATIVE Final    Comment:        The GeneXpert MRSA Assay (FDA approved for NASAL specimens only), is one component of a comprehensive MRSA colonization surveillance program. It is not intended to diagnose MRSA infection nor to guide or monitor treatment for MRSA infections. READ BACK AND VERIFIED BY MARCELLA TURNER AT 2120 07/11/2015.  TFK      Scheduled Meds: . acidophilus  1 capsule Oral BID  . antiseptic oral rinse  7 mL Mouth Rinse BID  . aspirin EC  81 mg Oral Daily  . atorvastatin  80 mg Oral QHS  . benzonatate  200 mg Oral TID  . budesonide (PULMICORT) nebulizer solution  0.25 mg Nebulization BID  . Chlorhexidine Gluconate Cloth  6 each Topical Q0600  . colesevelam  1,250 mg Oral BID WC  . insulin aspart  0-5 Units Subcutaneous QHS  . insulin aspart  0-9 Units Subcutaneous TID WC  . ipratropium-albuterol  3 mL Nebulization BID  . magic mouthwash  5 mL Oral TID   And  . lidocaine  5 mL Mouth/Throat TID  . LORazepam  0.25 mg Oral Once per day on Tue Thu Sat  . methylPREDNISolone (SOLU-MEDROL) injection  20 mg Intravenous Q24H  . metronidazole  500 mg Intravenous Q8H  . mupirocin ointment  1 application Nasal BID  . ondansetron      . ondansetron (ZOFRAN) IV  4 mg Intravenous  Q6H  . sodium chloride flush  3 mL Intravenous Q12H  . vancomycin  125 mg Oral Q6H  . Warfarin - Pharmacist Dosing Inpatient   Does not apply q1800    Assessment/Plan:  1. Clinical sepsis present on admission. Initially thought to be secondary to pneumonia but now on thinking was all C. difficile colitis. Continue vancomycin orally and Flagyl IV. Stop other antibiotics for pneumonia since initial chest x-ray was negative. Patient still very symptomatic with nausea vomiting  and diarrhea and abdominal pain. Get a chest x-ray and abdominal x-ray. Start standing dose Zofran. 2. End-stage renal disease on dialysis Tuesday Thursday Saturday 3. Relative hypotension on presentation. Patient was given a fluid bolus at that time. 4. History of coronary artery disease with borderline troponin. Continue aspirin. Troponin elevation secondary to demand ischemia 5. COPD exacerbation. Decrease Solu-Medrol continue budesonide and and nebulizers 6. Dysphagia with silent aspiration speech therapy following 7. Stage II decubiti buttock present on admission 8. Chronic chest wound. Seems to be healing well.  Code Status:     Code Status Orders        Start     Ordered   06/20/2015 1602  Full code   Continuous     07/07/2015 1601    Code Status History    Date Active Date Inactive Code Status Order ID Comments User Context   This patient has a current code status but no historical code status.     Family Communication: Wife at the bedside Disposition Plan: To be determined  Consultants:  Nephrology  Antibiotics:  Oral vancomycin  IV Flagyl  Time spent: 25 minutes  Loletha Grayer  [employer]

## 2015-07-13 NOTE — Consult Note (Signed)
Pharmacy Antibiotic Note  John Robinson is a 82 y.o. male admitted on 06/25/2015 with pneumonia. MD notes dysphagia and concern for aspiration PNA. Pharmacy has been consulted for C diff management.   Plan: Will continue current orders for Metronidazole 500mg  IV Q8hr and Vancomycin 500 mg PO q6h.   Height: 6' (182.9 cm) Weight: 157 lb 6.5 oz (71.4 kg) IBW/kg (Calculated) : 77.6  Temp (24hrs), Avg:97.5 F (36.4 C), Min:96.9 F (36.1 C), Max:97.9 F (36.6 C)   Recent Labs Lab 07/01/2015 1240 06/28/2015 1530 07/05/2015 1924 07/11/15 0413 07/12/15 0603 07/13/15 0625  WBC 24.7*  --   --  24.2* 36.1* 44.2*  CREATININE 6.88*  --   --  8.10* 4.61* 6.04*  LATICACIDVEN  --  1.7 1.6  --   --   --     Estimated Creatinine Clearance: 9.7 mL/min (by C-G formula based on Cr of 6.04).    Allergies  Allergen Reactions  . Pletal [Cilostazol] Other (See Comments)    Reaction:  Dizziness    Antimicrobials this admission: vancomycin 6/26 >> 6/27 Azithromycin 6/26 >> 6/27 cefepime 6/26 >> 6/28 metronidazole 6/27 >> Vancomycin PO 6/27 >>   Dose adjustments this admission:  Microbiology results: 6/26 BCx: NGTD 6/26 cdiff: positive  6/27 Sputum Cx: pending  6/27 MRSA PCR: positive  Pharmacy will continue to monitor and adjust per consult.    Rayna Sexton, PharmD, BCPS Clinical Pharmacist 07/13/2015 11:18 AM

## 2015-07-13 NOTE — Progress Notes (Signed)
ANTICOAGULATION CONSULT NOTE - Follow up [facility] for Warfarin  Indication: VTE prophylaxis  Allergies  Allergen Reactions  . Pletal [Cilostazol] Other (See Comments)    Reaction:  Dizziness    Patient Measurements: Height: 6' (182.9 cm) Weight: 159 lb 6.3 oz (72.3 kg) IBW/kg (Calculated) : 77.6  Vital Signs: Temp: 97.1 F (36.2 C) (06/29 0836) Temp Source: Axillary (06/29 0836) BP: 93/56 mmHg (06/29 0836) Pulse Rate: 68 (06/29 0836)  Labs:  Recent Labs  07/13/2015 1240  07/11/15 0413 07/11/15 1330 07/12/15 0603 07/13/15 0625  HGB 11.8*  --  11.0*  --  12.4* 14.0  HCT 35.3*  --  33.8*  --  39.3* 43.9  PLT 269  --  257  --  329 303  LABPROT 39.8*  < > 46.7* 49.4* 30.2* 36.4*  INR 4.25*  < > 5.26* 5.67* 2.95 3.78  CREATININE 6.88*  --  8.10*  --  4.61* 6.04*  TROPONINI 0.11*  --   --   --   --   --   < > = values in this interval not displayed.  Estimated Creatinine Clearance: 9.8 mL/min (by C-G formula based on Cr of 6.04).  Assessment: Pharmacy consulted to dose warfarin in this 82 year old male admitted with PNA and Cdiff. Currently also on metronidazole and oral vancomycin.  Pt was on warfarin 5 mg PO QHS at home.  INR on 6/26 was 4.25.   6/27 INR 5.26 - s/p Vit K 5 mg PO x1 at 0954 6/28 INR 2.95 - warfarin 2 mg  6/29 INR 3.78  Goal of Therapy:  INR 2-3   Plan:  INR supratherapeutic today despite Vit K given on 6/27 and lower dose of warfarin yesterday. May be seeing DDI with metronidazole.  Will hold warfarin today. Will recheck INR daily.   Per RN, no overt s/sx of bleeding noted. Hgb and Plt count relatively stable.   Pharmacy will continue to follow.   Rayna Sexton L 07/13/2015,10:48 AM

## 2015-07-13 NOTE — Progress Notes (Signed)
Nutrition Follow-up  DOCUMENTATION CODES:   Severe malnutrition in context of acute illness/injury  INTERVENTION:  -Monitor intake and cater to pt preferences.  Agree with regular diet at this time as poor po intake -Recommend Ensure Enlive po TID, each supplement provides 350 kcal and 20 grams of protein.  If intake improves and may need to consider nepro as pt with ESRD on HD.  Pt likes ensure and has been drinking at home   NUTRITION DIAGNOSIS:   Malnutrition related to acute illness as evidenced by severe depletion of body fat, severe depletion of muscle mass.  Continues  GOAL:   Patient will meet greater than or equal to 90% of their needs Not meeting nutritional needs  MONITOR:   PO intake, Supplement acceptance, Weight trends  REASON FOR ASSESSMENT:   Consult Poor PO  ASSESSMENT:      Pt s/p MBSS and noted SLP recommend regular with thin liquids.  Pt vomiting this am.  Noted MD planning chest xray and abdominal xray this am.   Pt has had a PEG tube in the past but has been removed.   Per I and O sheet intake bites, noted 100% documented at breakfast on 6/27, limited documentation. Wife not present this am and pt not appropriate to speak with.    Medications reviewed acidiphilus, solumedrol Labs reviewed: K 5.4, BUN 73, creatinine 6.04, glucose 394  Diet Order:  Diet regular Room service appropriate?: Yes with Assist; Fluid consistency:: Thin  Skin:   (pressure ulcer noted on coccyx)  Last BM:  6/28  Height:   Ht Readings from Last 1 Encounters:  06/19/2015 6' (1.829 m)    Weight:   Wt Readings from Last 1 Encounters:  07/11/15 159 lb 6.3 oz (72.3 kg)    Ideal Body Weight:     BMI:  Body mass index is 21.61 kg/(m^2).  Estimated Nutritional Needs:   Kcal:  2160-2520 kcals/d  Protein:  86-108 g/d  Fluid:  >/= 2L/d  EDUCATION NEEDS:   No education needs identified at this time  Adrielle Polakowski B. Zenia Resides, [place], [place] (pager) Weekend/On-Call  pager [phone])

## 2015-07-13 NOTE — Progress Notes (Signed)
OT Cancellation Note  Patient Details Name: John Robinson MRN: [id number] DOB: [birth year]   Cancelled Treatment:    Reason Eval/Treat Not Completed: Medical issues which prohibited therapy Per chart review, patient has been hypotensive in am and last BP was 67/44 at 1331 which is contraindicated for OT evaluation. Will monitor and provide evaluation when medically appropriate.  Chrys Racer, OTR/L ascom [phone] 07/13/2015, 1:32 PM

## 2015-07-13 NOTE — Consult Note (Signed)
Consultation Note Date: 07/13/2015   Patient Name: John Robinson  DOB: 1932/11/30  MRN: [id number]  Age / Sex: 82 y.o., male  PCP: Birdie Sons, MD Referring Physician: Max Sane, MD  Reason for Consultation: Establishing goals of care and Psychosocial/spiritual support  HPI/Patient Profile: 82 y.o. male   admitted on 06/22/2015   , CAD.   82 y.o. with end-stage renal diseas(dialysis dependant for 1.5 yrs)e/, coronary artery disease, diabetes, atrial flutter, and COPD who underwent coronary artery bypass grafting 4 with open vein harvest of left lower extremity on 04/03/2015 at [facility]. He had a PEA arrest on 04/14/2015 and was found to be bacteremic. Source was temporary dialysis catheter; this was replaced, and he was treated with IV antibiotics. He also had Serratia noted on a bronchial culture. For atrial flutter he was anticoagulated with heparin and bridged to warfarin. On 05/01/2015 drainage was noted from inferior portion of sternal incision which ultimately required surgical debridement and wound VAC placement. He was discharged to a nursing facility on 05/20/2015. Presented to [facility] with fatigue/malaise, productive cough and sob, possible multifocal PNA based on imaging, CCM Consulted to PNA and sepsis.    Per family he has had "terrible time" for the past several months. " one step forward two steps back"  Faced with advanced directive decisions, limitations of medical interventions to prolong quality of life and facing ones mortality.   Clinical Assessment and Goals of Care:  This NP Wadie Lessen reviewed medical records, received report from team, assessed the patient and then meet at the patient's bedside along with his wife/HPOA to discuss diagnosis, prognosis, GOC, EOL wishes disposition and options.  Patient had minimal engagement in conversation, he just wanted me to "talk to her/her".  He  is very lethargic at this time.  A  discussion was had today regarding advanced directives.  Concepts specific to code status, artifical feeding and hydration, continued IV antibiotics and rehospitalization was had.  The difference between a aggressive medical intervention path  and a palliative comfort care path for this patient at this time was had.  Values and goals of care important to patient and family were attempted to be elicited.  Concept of Hospice and Palliative Care were discussed    Questions and concerns addressed.  Hard Choices booklet left for review. Family encouraged to call with questions or concerns.     SUMMARY OF RECOMMENDATIONS    Code Status/Advance Care Planning:  Full code- strongly encouraged consideration  DNR/DNI knowing poor outcomes in similar patients    Palliative Prophylaxis:   Aspiration, Bowel Regimen, Frequent Pain Assessment and Oral Care  Additional Recommendations (Limitations, Scope, Preferences):  Patient and his family are open to all offered and availble medcial intervetnions to prolong life, they are hopeful for improvement.  Informed Mrs Luby that I will f/u next week if her husband is still hospitalized but to call with questions or concerns   Psycho-social/Spiritual:    Additional Recommendations: Education on Hospice  Prognosis:   Unable to determine-  dependant on desire for life prolonging measures  Discharge Planning: To Be Determined      Primary Diagnoses: Present on Admission:  . Pneumonia . Protein-calorie malnutrition, severe  I have reviewed the medical record, interviewed the patient and family, and examined the patient. The following aspects are pertinent.  Past Medical History  Diagnosis Date  . Chicken pox   . Measles   . Mumps   . History of MI (myocardial infarction)   . History of malignant melanoma   . History of recurrent UTIs   . Coronary artery disease   . Hypertension   . Chronic kidney disease     Social History   Social History  . Marital Status: Married    Spouse Name: N/A  . Number of Children: N/A  . Years of Education: 10th grade   Social History Main Topics  . Smoking status: Former Smoker -- 0.25 packs/day    Types: Cigarettes    Quit date: 05/17/2012  . Smokeless tobacco: Never Used  . Alcohol Use: No  . Drug Use: No  . Sexual Activity: Not Asked   Other Topics Concern  . None   Social History Narrative   Family History  Problem Relation Age of Onset  . Cancer Mother     Lymphatic cancer  . Lung cancer Father   . Diabetes Brother     type 2  . Heart attack Brother    Scheduled Meds: . acidophilus  1 capsule Oral BID  . antiseptic oral rinse  7 mL Mouth Rinse BID  . aspirin EC  81 mg Oral Daily  . atorvastatin  80 mg Oral QHS  . benzonatate  200 mg Oral TID  . budesonide (PULMICORT) nebulizer solution  0.25 mg Nebulization BID  . Chlorhexidine Gluconate Cloth  6 each Topical Q0600  . colesevelam  1,250 mg Oral BID WC  . feeding supplement (ENSURE ENLIVE)  237 mL Oral TID  . insulin aspart  0-5 Units Subcutaneous QHS  . insulin aspart  0-9 Units Subcutaneous TID WC  . ipratropium-albuterol  3 mL Nebulization BID  . magic mouthwash  5 mL Oral TID   And  . lidocaine  5 mL Mouth/Throat TID  . LORazepam  0.25 mg Oral Once per day on Tue Thu Sat  . methylPREDNISolone (SOLU-MEDROL) injection  20 mg Intravenous Q24H  . metronidazole  500 mg Intravenous Q8H  . mupirocin ointment  1 application Nasal BID  . ondansetron (ZOFRAN) IV  4 mg Intravenous Q6H  . sodium chloride  500 mL Intravenous Once  . sodium chloride flush  3 mL Intravenous Q12H  . vancomycin  500 mg Oral Q6H  . Warfarin - Pharmacist Dosing Inpatient   Does not apply q1800   Continuous Infusions: . sodium chloride     PRN Meds:.acetaminophen **OR** acetaminophen, lidocaine-prilocaine, morphine injection, nitroGLYCERIN, ondansetron (ZOFRAN) IV, traMADol Medications Prior to Admission:   Prior to Admission medications   Medication Sig Start Date End Date Taking? Authorizing Provider  acidophilus (RISAQUAD) CAPS capsule Take 1 capsule by mouth 2 (two) times daily.   Yes Historical Provider, MD  amLODipine (NORVASC) 10 MG tablet Take 10 mg by mouth daily.   Yes Historical Provider, MD  aspirin EC 81 MG tablet Take 81 mg by mouth daily.   Yes Historical Provider, MD  atorvastatin (LIPITOR) 80 MG tablet Take 80 mg by mouth at bedtime.    Yes Historical Provider, MD  cefdinir (OMNICEF) 300 MG capsule Take 1 capsule (  300 mg total) by mouth 2 (two) times daily. 07/07/15 07/14/15 Yes Birdie Sons, MD  colesevelam (WELCHOL) 625 MG tablet Take 1,250 mg by mouth 2 (two) times daily with a meal.    Yes Historical Provider, MD  glipiZIDE (GLUCOTROL) 5 MG tablet Take 1 tablet (5 mg total) by mouth daily before breakfast. 02/01/15  Yes Birdie Sons, MD  hydrALAZINE (APRESOLINE) 25 MG tablet Take 25 mg by mouth every 8 (eight) hours.    Yes Historical Provider, MD  HYDROcodone-homatropine (HYCODAN) 5-1.5 MG/5ML syrup Take 5 mLs by mouth every 8 (eight) hours as needed for cough. 07/07/15  Yes Birdie Sons, MD  insulin aspart (NOVOLOG) 100 UNIT/ML injection Inject into the skin 3 (three) times daily with meals as needed for high blood sugar. Pt uses as needed per sliding scale.   Yes Historical Provider, MD  lidocaine-prilocaine (EMLA) cream Apply 1 application topically as needed (prior to accessing port).    Yes Historical Provider, MD  lisinopril (PRINIVIL,ZESTRIL) 40 MG tablet Take 40 mg by mouth daily.    Yes Historical Provider, MD  LORazepam (ATIVAN) 0.5 MG tablet Take 0.25 mg by mouth 3 (three) times a week. Pt takes on Tuesday, Thursday, and Saturday.   Yes Historical Provider, MD  nitroGLYCERIN (NITROSTAT) 0.4 MG SL tablet Place 0.4 mg under the tongue every 5 (five) minutes as needed for chest pain.   Yes Historical Provider, MD  traMADol (ULTRAM) 50 MG tablet Take 50 mg by mouth 2  (two) times daily.   Yes Historical Provider, MD  warfarin (COUMADIN) 5 MG tablet Take 5 mg by mouth at bedtime.    Yes Historical Provider, MD   Allergies  Allergen Reactions  . Pletal [Cilostazol] Other (See Comments)    Reaction:  Dizziness   Review of Systems  Constitutional:       ' I feel bad and hurt all over" Doesn't want to talk, "just talk to her(wife)    Physical Exam  Constitutional: He appears lethargic. He appears cachectic. He has a sickly appearance.  Pulmonary/Chest: He has decreased breath sounds.  Neurological: He appears lethargic.  Skin: Skin is warm and dry.    Vital Signs: BP 93/56 mmHg  Pulse 68  Temp(Src) 97.1 F (36.2 C) (Axillary)  Resp 20  Ht 6' (1.829 m)  Wt 72.3 kg (159 lb 6.3 oz)  BMI 21.61 kg/m2  SpO2 96% Pain Assessment: 0-10 POSS *See Group Information*: 1-Acceptable,Awake and alert Pain Score: 2    SpO2: SpO2: 96 % O2 Device:SpO2: 96 % O2 Flow Rate: .O2 Flow Rate (L/min): 4 L/min  IO: Intake/output summary:  Intake/Output Summary (Last 24 hours) at 07/13/15 1057 Last data filed at 07/13/15 [id number]  Gross per 24 hour  Intake   1262 ml  Output    150 ml  Net   1112 ml    LBM: Last BM Date: 07/12/15 Baseline Weight: Weight: 68.04 kg (150 lb) Most recent weight: Weight: 72.3 kg (159 lb 6.3 oz)     Palliative Assessment/Data: 30 % at best   Flowsheet Rows        Most Recent Value   Intake Tab    Referral Department  Hospitalist   Unit at Time of Referral  Med/Surg Unit   Palliative Care Primary Diagnosis  Nephrology   Date Notified  07/12/15   Palliative Care Type  New Palliative care   Reason for referral  Clarify Goals of Care   Date of Admission  06/29/2015   #  of days IP prior to Palliative referral  2   Clinical Assessment    Psychosocial & Spiritual Assessment    Palliative Care Outcomes      Discussed with Dr Manuella Ghazi  Time In: 0900 Time Out: 1000 Time Total: 60 min Greater than 50%  of this time was spent  counseling and coordinating care related to the above assessment and plan.  Signed by: Wadie Lessen, NP   Please contact Palliative Medicine Team phone at [phone] for questions and concerns.  For individual provider: See Shea Evans

## 2015-07-13 NOTE — Progress Notes (Signed)
PRE HD   

## 2015-07-13 NOTE — Progress Notes (Addendum)
Inpatient Diabetes Program Recommendations  AACE/ADA: New Consensus Statement on Inpatient Glycemic Control (2015)  Target Ranges:  Prepandial:   less than 140 mg/dL      Peak postprandial:   less than 180 mg/dL (1-2 hours)      Critically ill patients:  140 - 180 mg/dL   Lab Results  Component Value Date   GLUCAP 372* 07/13/2015   HGBA1C 5.9 07/11/2015    Review of Glycemic Control  Results for KESAN, [place] (MRN [id number]) as of 07/13/2015 09:11  Ref. Range 07/12/2015 07:35 07/12/2015 11:46 07/12/2015 16:51 07/12/2015 21:12 07/13/2015 08:08  Glucose-Capillary Latest Ref Range: 65-99 mg/dL 386 (H) 356 (H) 298 (H) 287 (H) 372 (H)    Home DM Meds: Novolog SSI, Glipizide 5 mg daily  Current Insulin Orders: Novolog Sensitive Correction Scale/ SSI (0-9 units) TID AC + HS  * steroids q24hr    MD- Please consider the following in-hospital insulin adjustments while patient receiving IV steroids:  1. Start Levemir 14 units QHS  2. Increase Novolog Correction Scale/ SSI to Moderate scale (0-15 units) TID AC + HS  3. Please change diet to carb modified/heart healthy  Gentry Fitz, RN, [place], [place], CDE Diabetes Coordinator Inpatient Diabetes Program  [phone] (Team Pager) [phone] ([place]) 07/13/2015 9:12 AM

## 2015-07-13 NOTE — Progress Notes (Signed)
Patient ID: John Robinson, male   DOB: [birth year], 82 y.o.   MRN: [id number]  Nurse felt uncomfortable with the patient on the floor still being hypotensive with IV fluids and boluses. I will transfer to the stepdown unit for closer monitoring.  Dr. Loletha Grayer.

## 2015-07-13 NOTE — Progress Notes (Signed)
Subjective:  Patient admitted for coughing spells and shortness of breath. Also reported sore throat, greenish sputum, cold chills Patient underwent three-vessel bypass and [facility], postop course complicated by sternal wound infection, requires rehabilitation for 6 weeks.  At home for about one week Feels extremely weak today.  Tested positive for C. Difficile infection Having significant amount of loose stools Appetite is poor BP is low, requiring fluid bolus Reports abdominal pain today   Objective:  Vital signs in last 24 hours:  Temp:  [96.9 F (36.1 C)-97.9 F (36.6 C)] 97.1 F (36.2 C) (06/29 0836) Pulse Rate:  [65-110] 68 (06/29 0836) Resp:  [18-22] 20 (06/29 0836) BP: (85-137)/(46-91) 93/56 mmHg (06/29 0836) SpO2:  [79 %-100 %] 96 % (06/29 0906)  Weight change:  Filed Weights   06/16/2015 1236 07/11/15 1515 07/11/15 1850  Weight: 68.04 kg (150 lb) 72.8 kg (160 lb 7.9 oz) 72.3 kg (159 lb 6.3 oz)    Intake/Output:    Intake/Output Summary (Last 24 hours) at 07/13/15 1027 Last data filed at 07/13/15 [id number]  Gross per 24 hour  Intake   1262 ml  Output    150 ml  Net   1112 ml     Physical Exam: General: No acute distress, laying in the bed  HEENT anicteric  Neck supple  Pulm/lungs Mild basilar crackles, normal respiratory effort,    CVS/Heart irregular, soft systolic murmur  Abdomen:  Soft, nontender  Extremities: No peripheral edema  Neurologic: Alert, oriented  Skin: bruises over arms  Access: Left IJ PermCath       Basic Metabolic Panel:   Recent Labs Lab 07/05/2015 1240 07/11/15 0413 07/12/15 0603 07/13/15 0625  NA 137 139 135 138  K 4.1 5.3* 4.9 5.4*  CL 103 106 98* 102  CO2 21* 18* 24 22  GLUCOSE 214* 249* 357* 394*  BUN 101* 212* 53* 73*  CREATININE 6.88* 8.10* 4.61* 6.04*  CALCIUM 8.5* 8.4* 7.7* 7.7*     CBC:  Recent Labs Lab 07/03/2015 1240 07/11/15 0413 07/12/15 0603 07/13/15 0625  WBC 24.7* 24.2* 36.1* 44.2*  NEUTROABS 21.2*  --    --   --   HGB 11.8* 11.0* 12.4* 14.0  HCT 35.3* 33.8* 39.3* 43.9  MCV 85.5 86.8 87.7 88.1  PLT 269 257 329 303      Microbiology:  Recent Results (from the past 720 hour(s))  C difficile quick scan w PCR reflex     Status: Abnormal   Collection Time: 06/16/2015  1:45 AM  Result Value Ref Range Status   C Diff antigen POSITIVE (A) NEGATIVE Final   C Diff toxin POSITIVE (A) NEGATIVE Final   C Diff interpretation   Final    Positive for toxigenic C. difficile, active toxin production present.    Comment: CRITICAL RESULT CALLED TO, READ BACK BY AND VERIFIED WITH:  JACK DOUGHERTY AT [id number] 07/11/15 SDR   Blood culture (routine x 2)     Status: None (Preliminary result)   Collection Time: 06/23/2015  2:10 PM  Result Value Ref Range Status   Specimen Description BLOOD RIGHT ASSIST CONTROL  Final   Special Requests BOTTLES DRAWN AEROBIC AND ANAEROBIC [place]  Final   Culture NO GROWTH 3 DAYS  Final   Report Status PENDING  Incomplete  Blood culture (routine x 2)     Status: None (Preliminary result)   Collection Time: 06/20/2015  2:10 PM  Result Value Ref Range Status   Specimen Description BLOOD RIGHT HAND  Final  Special Requests   Final    BOTTLES DRAWN AEROBIC AND ANAEROBIC [place]   Culture NO GROWTH 3 DAYS  Final   Report Status PENDING  Incomplete  MRSA PCR Screening     Status: Abnormal   Collection Time: 07/11/15  7:06 PM  Result Value Ref Range Status   MRSA by PCR POSITIVE (A) NEGATIVE Final    Comment:        The GeneXpert MRSA Assay (FDA approved for NASAL specimens only), is one component of a comprehensive MRSA colonization surveillance program. It is not intended to diagnose MRSA infection nor to guide or monitor treatment for MRSA infections. READ BACK AND VERIFIED BY MARCELLA TURNER AT 2120 07/11/2015.  TFK     Coagulation Studies:  Recent Labs  06/15/2015 1924 07/11/15 0413 07/11/15 1330 07/12/15 0603 07/13/15 0625  LABPROT 42.8* 46.7*  49.4* 30.2* 36.4*  INR 4.69* 5.26* 5.67* 2.95 3.78    Urinalysis: No results for input(s): COLORURINE, LABSPEC, PHURINE, GLUCOSEU, HGBUR, BILIRUBINUR, KETONESUR, PROTEINUR, UROBILINOGEN, NITRITE, LEUKOCYTESUR in the last 72 hours.  Invalid input(s): APPERANCEUR    Imaging: Dg Chest 1 View  07/13/2015  CLINICAL DATA:  Increased SOB, Recent heart surgery 3-4 weeks ago, hx MI, CAD, HTN EXAM: CHEST 1 VIEW COMPARISON:  06/16/2015 FINDINGS: Left-sided dialysis catheter tips overlie the lower superior vena cava and upper right atrium. The heart is enlarged. Status post median sternotomy and CABG. There is new opacification at the left lung base which completely obscures the left hemidiaphragm. No pulmonary edema. Small bilateral pleural effusions are present. IMPRESSION: 1. New opacification of the left lower lobe consistent with atelectasis or infiltrate. 2. Bilateral small effusions. Electronically Signed   By: Nolon Nations M.D.   On: 07/13/2015 09:13   Dg Abd Portable 2v  07/13/2015  CLINICAL DATA:  Diffuse abd pain, pt states he had heart 3-4 weeks ago EXAM: PORTABLE ABDOMEN - 2 VIEW COMPARISON:  06/26/2015 chest x-ray FINDINGS: Status post median sternotomy. Partially imaged dialysis catheter tips overlie the upper right atrium. There is dense opacity at the left lung base, obscuring the left hemidiaphragm, increased over prior. Contrast is identified within the stomach. Aortic stent graft is present. Bowel gas pattern is nonobstructed. No evidence for free intraperitoneal air. IMPRESSION: New opacification of the left lower lobe of the lung. Nonobstructive bowel gas pattern. Electronically Signed   By: Nolon Nations M.D.   On: 07/13/2015 09:12     Medications:     . acidophilus  1 capsule Oral BID  . antiseptic oral rinse  7 mL Mouth Rinse BID  . aspirin EC  81 mg Oral Daily  . atorvastatin  80 mg Oral QHS  . benzonatate  200 mg Oral TID  . budesonide (PULMICORT) nebulizer solution   0.25 mg Nebulization BID  . Chlorhexidine Gluconate Cloth  6 each Topical Q0600  . colesevelam  1,250 mg Oral BID WC  . feeding supplement (ENSURE ENLIVE)  237 mL Oral TID  . insulin aspart  0-5 Units Subcutaneous QHS  . insulin aspart  0-9 Units Subcutaneous TID WC  . ipratropium-albuterol  3 mL Nebulization BID  . magic mouthwash  5 mL Oral TID   And  . lidocaine  5 mL Mouth/Throat TID  . LORazepam  0.25 mg Oral Once per day on Tue Thu Sat  . methylPREDNISolone (SOLU-MEDROL) injection  20 mg Intravenous Q24H  . metronidazole  500 mg Intravenous Q8H  . mupirocin ointment  1 application Nasal BID  .  ondansetron (ZOFRAN) IV  4 mg Intravenous Q6H  . sodium chloride  500 mL Intravenous Once  . sodium chloride flush  3 mL Intravenous Q12H  . vancomycin  500 mg Oral Q6H  . Warfarin - Pharmacist Dosing Inpatient   Does not apply q1800   acetaminophen **OR** acetaminophen, lidocaine-prilocaine, morphine injection, nitroGLYCERIN, ondansetron (ZOFRAN) IV, traMADol  Assessment/ Plan:  82 y.o. male with end-stage renal disease, hemodialysis [address], Tuesday, Thursday, Saturday, history of severe vascular disease including aortic-iliac bypass,four-vessel CABG at [facility] March 2017, postop wound infection, coronary disease, history of RCA stent, COPD  1. End-stage renal disease.  TuesdayThursday/Saturday.  [address] - dialysis as per his regular schedule - left arm access needs revision which would be scheduled after acute issues are over.  - Dialysis via permcath  2. Anemia of chronic kidney disease. Hemoglobin 14 at present (likely hemoconcentration) Hold Procrit   3.  Supratherapeutic INR  4. Sepsis with bilateral pneumonia Antibiotics as per primary team. May need to continue iv maintenance fluids due to ongoing losses from diarrhea  5.  Secondary hyperparathyroidism Monitor phosphorus during this admission no binders listed on home meds  6. C. Difficile diarrhea  and colitis Currently metronidazole IV, vancomycin oral   LOS: 3 Jennifier Smitherman 6/29/201710:27 AM

## 2015-07-14 ENCOUNTER — Inpatient Hospital Stay: Payer: Commercial Managed Care - HMO

## 2015-07-14 DIAGNOSIS — R6521 Severe sepsis with septic shock: Secondary | ICD-10-CM

## 2015-07-14 DIAGNOSIS — R6520 Severe sepsis without septic shock: Secondary | ICD-10-CM

## 2015-07-14 DIAGNOSIS — A419 Sepsis, unspecified organism: Secondary | ICD-10-CM | POA: Insufficient documentation

## 2015-07-14 LAB — CBC
HCT: 44.4 % (ref 40.0–52.0)
Hemoglobin: 14.2 g/dL (ref 13.0–18.0)
MCH: 27.8 pg (ref 26.0–34.0)
MCHC: 31.9 g/dL — AB (ref 32.0–36.0)
MCV: 87.1 fL (ref 80.0–100.0)
PLATELETS: 194 10*3/uL (ref 150–440)
RBC: 5.1 MIL/uL (ref 4.40–5.90)
RDW: 21.8 % — ABNORMAL HIGH (ref 11.5–14.5)
WBC: 47.2 10*3/uL — ABNORMAL HIGH (ref 3.8–10.6)

## 2015-07-14 LAB — BLOOD GAS, ARTERIAL
BICARBONATE: 24.7 meq/L (ref 21.0–28.0)
FIO2: 0.36
PATIENT TEMPERATURE: 37
PCO2 ART: 48 mmHg (ref 32.0–48.0)
PH ART: 7.32 — AB (ref 7.350–7.450)

## 2015-07-14 LAB — PHOSPHORUS: Phosphorus: 6.3 mg/dL — ABNORMAL HIGH (ref 2.5–4.6)

## 2015-07-14 LAB — GLUCOSE, CAPILLARY
GLUCOSE-CAPILLARY: 314 mg/dL — AB (ref 65–99)
GLUCOSE-CAPILLARY: 285 mg/dL — AB (ref 65–99)
Glucose-Capillary: 191 mg/dL — ABNORMAL HIGH (ref 65–99)
Glucose-Capillary: 217 mg/dL — ABNORMAL HIGH (ref 65–99)

## 2015-07-14 LAB — BASIC METABOLIC PANEL
Anion gap: 10 (ref 5–15)
Anion gap: 10 (ref 5–15)
BUN: 45 mg/dL — AB (ref 6–20)
BUN: 54 mg/dL — AB (ref 6–20)
CHLORIDE: 104 mmol/L (ref 101–111)
CO2: 26 mmol/L (ref 22–32)
CO2: 25 mmol/L (ref 22–32)
CREATININE: 4.21 mg/dL — AB (ref 0.61–1.24)
CREATININE: 4.84 mg/dL — AB (ref 0.61–1.24)
Calcium: 7.8 mg/dL — ABNORMAL LOW (ref 8.9–10.3)
Calcium: 7.6 mg/dL — ABNORMAL LOW (ref 8.9–10.3)
Chloride: 103 mmol/L (ref 101–111)
GFR calc Af Amer: 12 mL/min — ABNORMAL LOW (ref >60)
GFR calc non Af Amer: 10 mL/min — ABNORMAL LOW (ref >60)
GFR, EST AFRICAN AMERICAN: 14 mL/min — AB (ref >60)
GFR, EST NON AFRICAN AMERICAN: 12 mL/min — AB (ref >60)
GLUCOSE: 209 mg/dL — AB (ref 65–99)
Glucose, Bld: 282 mg/dL — ABNORMAL HIGH (ref 65–99)
Potassium: 4.8 mmol/L (ref 3.5–5.1)
Potassium: 4.5 mmol/L (ref 3.5–5.1)
SODIUM: 139 mmol/L (ref 135–145)
SODIUM: 139 mmol/L (ref 135–145)

## 2015-07-14 LAB — PROTIME-INR
INR: 4.06 — AB
PROTHROMBIN TIME: 38.7 s — AB (ref 11.4–15.0)

## 2015-07-14 LAB — HEMOGLOBIN AND HEMATOCRIT, BLOOD
HCT: 39.7 % — ABNORMAL LOW (ref 40.0–52.0)
Hemoglobin: 12.5 g/dL — ABNORMAL LOW (ref 13.0–18.0)

## 2015-07-14 LAB — PROCALCITONIN: Procalcitonin: 2.3 ng/mL

## 2015-07-14 LAB — MAGNESIUM: MAGNESIUM: 2.1 mg/dL (ref 1.7–2.4)

## 2015-07-14 LAB — LACTIC ACID, PLASMA: Lactic Acid, Venous: 2.2 mmol/L (ref 0.5–1.9)

## 2015-07-14 MED ORDER — METRONIDAZOLE 250 MG PO TABS
500.0000 mg | ORAL_TABLET | Freq: Three times a day (TID) | ORAL | Status: DC
  Administered 2015-07-14 – 2015-07-15 (×3): 500 mg via ORAL
  Filled 2015-07-14 (×3): qty 2

## 2015-07-14 MED ORDER — PHYTONADIONE 5 MG PO TABS
5.0000 mg | ORAL_TABLET | Freq: Once | ORAL | Status: AC
  Administered 2015-07-14: 5 mg via ORAL
  Filled 2015-07-14: qty 1

## 2015-07-14 MED ORDER — SODIUM CHLORIDE 0.9 % IV BOLUS (SEPSIS)
1000.0000 mL | Freq: Once | INTRAVENOUS | Status: AC
  Administered 2015-07-14: 1000 mL via INTRAVENOUS

## 2015-07-14 MED ORDER — INSULIN GLARGINE 100 UNIT/ML ~~LOC~~ SOLN
14.0000 [IU] | Freq: Every day | SUBCUTANEOUS | Status: DC
  Administered 2015-07-14: 14 [IU] via SUBCUTANEOUS
  Filled 2015-07-14 (×2): qty 0.14

## 2015-07-14 MED ORDER — INSULIN ASPART 100 UNIT/ML ~~LOC~~ SOLN
0.0000 [IU] | Freq: Three times a day (TID) | SUBCUTANEOUS | Status: DC
  Administered 2015-07-14: 4 [IU] via SUBCUTANEOUS
  Administered 2015-07-14: 11 [IU] via SUBCUTANEOUS
  Administered 2015-07-15: 7 [IU] via SUBCUTANEOUS
  Administered 2015-07-15: 15 [IU] via SUBCUTANEOUS
  Administered 2015-07-15: 3 [IU] via SUBCUTANEOUS
  Administered 2015-07-16 (×2): 4 [IU] via SUBCUTANEOUS
  Administered 2015-07-17: 3 [IU] via SUBCUTANEOUS
  Administered 2015-07-18: 4 [IU] via SUBCUTANEOUS
  Administered 2015-07-18: 7 [IU] via SUBCUTANEOUS
  Administered 2015-07-18: 3 [IU] via SUBCUTANEOUS
  Administered 2015-07-19 (×2): 4 [IU] via SUBCUTANEOUS
  Filled 2015-07-14 (×2): qty 3
  Filled 2015-07-14 (×3): qty 4
  Filled 2015-07-14: qty 15
  Filled 2015-07-14: qty 3
  Filled 2015-07-14: qty 4
  Filled 2015-07-14: qty 7
  Filled 2015-07-14 (×2): qty 4
  Filled 2015-07-14: qty 11
  Filled 2015-07-14: qty 3
  Filled 2015-07-14: qty 7

## 2015-07-14 MED ORDER — LIDOCAINE HCL (PF) 1 % IJ SOLN
5.0000 mL | Freq: Once | INTRAMUSCULAR | Status: DC
  Filled 2015-07-14: qty 5

## 2015-07-14 NOTE — Progress Notes (Signed)
Pt had an episode of vomiting after this RN gave pt Welchol. Pt noted 2 pills in vomit. Pt stated after he vomited he didn't have any more nausea and felt ok. Melba Araki E 10:13 AM  07/14/2015

## 2015-07-14 NOTE — Progress Notes (Signed)
PT Cancellation Note  Patient Details Name: John Robinson MRN: [id number] DOB: 02/26/1932   Cancelled Treatment:    Reason Eval/Treat Not Completed: Patient declined, no reason specified Pt's BP remains inconsistent but generally low. Spoke with nursing who okayed trying to do some minimal bed exercises.  Family was in room encouraging him but he was clear that he did not want to do anything today and would consider exercises tomorrow if he was feeling better.  Will try back tomorrow as appropriate.  Kreg Shropshire 07/14/2015, 3:11 PM

## 2015-07-14 NOTE — Progress Notes (Signed)
OT Cancellation Note  Patient Details Name: John Robinson MRN: [id number] DOB: 1932-04-23   Cancelled Treatment:    Reason Eval/Treat Not Completed: Medical issues which prohibited therapy (INR is at a critical level which is contraindicated for OT services at this time. Pt. continues with low blood pressure.)   Harrel Carina, MS, OTR/L   Harrel Carina 07/14/2015, 9:15 AM

## 2015-07-14 NOTE — Progress Notes (Signed)
Inpatient Diabetes Program Recommendations  AACE/ADA: New Consensus Statement on Inpatient Glycemic Control (2015)  Target Ranges:  Prepandial:   less than 140 mg/dL      Peak postprandial:   less than 180 mg/dL (1-2 hours)      Critically ill patients:  140 - 180 mg/dL   Lab Results  Component Value Date   GLUCAP 314* 07/14/2015   HGBA1C 5.9 07/11/2015    Review of Glycemic Control   Results for John, Robinson (MRN [id number]) as of 07/14/2015 09:53  Ref. Range 07/12/2015 21:12 07/13/2015 08:08 07/13/2015 12:08 07/13/2015 21:13 07/14/2015 07:10  Glucose-Capillary Latest Ref Range: 65-99 mg/dL 287 (H) 372 (H) 222 (H) 264 (H) 314 (H)   Home DM Meds: Novolog SSI, Glipizide 5 mg daily  Current Insulin Orders: Novolog Sensitive Correction Scale/ SSI (0-9 units) TID AC + HS, Levemir 6 units qhs * steroids q24hr  Blood sugars still high 200's  MD- Please consider the following in-hospital insulin adjustments while patient receiving IV steroids:  1. Increase Levemir to 14 units QHS (0.2units/kg)  2. Increase Novolog Correction Scale/ SSI to Moderate scale (0-15 units) TID AC + HS  3. Please change diet to carb modified/heart healthy  Gentry Fitz, RN, [place], [place], CDE Diabetes Coordinator Inpatient Diabetes Program  [phone] (Team Pager) [phone] ([place]) 07/14/2015 9:54 AM

## 2015-07-14 NOTE — Procedures (Signed)
Central Venous Catheter Placement: Indication: Patient receiving vesicant or irritant drug.; Patient receiving intravenous therapy for longer than 5 days.; Patient has limited or no vascular access.   Consent:obtained, on chart.   Risks and benefits explained in detail including risk of infection, bleeding, respiratory failure and death..   Hand washing performed prior to starting the procedure.   Procedure: An active timeout was performed and correct patient, name, & ID confirmed.  After explaining risk and benefits, patient was positioned correctly for central venous access. Patient was prepped using strict sterile technique including chlorohexadine preps, sterile drape, sterile gown and sterile gloves.  The area was prepped, draped and anesthetized in the usual sterile manner. Patient comfort was obtained.  Initial attempt was made at the left femoral area, the guidewire couldn't be passed in this area. However, the dilator could not be inserted further than 3 cm. Therefore, this procedure was stopped at this site, firm pressure was placed on the site for 5 minutes and bleeding was stopped at that time. Subsequently, the right internal jugular area was examined. This appeared amenable for placement of triple lumen catheter.  A triple lumen catheter was placed in right Internal Jugular Vein There was good blood return, catheter caps were placed on lumens, catheter flushed easily, the line was secured and a sterile dressing and BIO-PATCH applied.   Ultrasound was used to visualize vasculature and guidance of needle.   Number of Attempts: 2 Complications:none Estimated Blood Loss: 20 mL Chest Radiograph indicated and ordered.   Operator: Marda Stalker, M.D.   Marda Stalker, M.D.

## 2015-07-14 NOTE — Progress Notes (Signed)
Patient ID: John Robinson, male   DOB: 1932-11-03, 82 y.o.   MRN: [id number] [facility] PROGRESS NOTE  John Robinson [id number] DOB: August 31, 1932 DOA: 06/26/2015 PCP: John Huh, MD  HPI/Subjective: Patient feeling weak. As per nurse only had one bowel movement. Patient did vomit one time. We lost IV access. Patient feels a little bit better than he did yesterday. Some shortness of breath.  Objective: Filed Vitals:   07/14/15 0800 07/14/15 1000  BP: 82/55 82/51  Pulse:    Temp:    Resp: 21 17    Filed Weights   07/11/15 1515 07/11/15 1850 07/13/15 1045  Weight: 72.8 kg (160 lb 7.9 oz) 72.3 kg (159 lb 6.3 oz) 71.4 kg (157 lb 6.5 oz)    ROS: Review of Systems  Constitutional: Negative for fever and chills.  Eyes: Negative for blurred vision.  Respiratory: Positive for shortness of breath. Negative for cough.   Cardiovascular: Negative for chest pain.  Gastrointestinal: Positive for nausea, vomiting and diarrhea. Negative for abdominal pain and constipation.  Genitourinary: Negative for dysuria.  Musculoskeletal: Negative for joint pain.  Neurological: Negative for dizziness and headaches.   Exam: Physical Exam  Constitutional: He is oriented to person, place, and time.  HENT:  Nose: No mucosal edema.  Mouth/Throat: No oropharyngeal exudate or posterior oropharyngeal edema.  Eyes: Conjunctivae, EOM and lids are normal. Pupils are equal, round, and reactive to light.  Neck: No JVD present. Carotid bruit is not present. No edema present. No thyroid mass and no thyromegaly present.  Cardiovascular: S1 normal and S2 normal.  Exam reveals no gallop.   No murmur heard. Pulses:      Dorsalis pedis pulses are 2+ on the right side, and 2+ on the left side.  Respiratory: No respiratory distress. He has wheezes in the right lower field and the left lower field. He has no rhonchi. He has no rales.  GI: Soft. Bowel sounds are normal. There is generalized tenderness.   Musculoskeletal:       Right ankle: He exhibits no swelling.       Left ankle: He exhibits no swelling.  Lymphadenopathy:    He has no cervical adenopathy.  Neurological: He is alert and oriented to person, place, and time. No cranial nerve deficit.  Skin: Skin is warm. No rash noted. Nails show no clubbing.  Bruising upper and lower extremities.  Psychiatric: He has a normal mood and affect.      Data Reviewed: Basic Metabolic Panel:  Recent Labs Lab 07/14/2015 1240 07/11/15 0413 07/12/15 0603 07/13/15 0625 07/14/15 0528  NA 137 139 135 138 139  K 4.1 5.3* 4.9 5.4* 4.8  CL 103 106 98* 102 103  CO2 21* 18* 24 22 26   GLUCOSE 214* 249* 357* 394* 282*  BUN 101* 212* 53* 73* 45*  CREATININE 6.88* 8.10* 4.61* 6.04* 4.21*  CALCIUM 8.5* 8.4* 7.7* 7.7* 7.8*   Liver Function Tests:  Recent Labs Lab 07/02/2015 1240  AST 17  ALT 17  ALKPHOS 91  BILITOT 0.5  PROT 5.9*  ALBUMIN 2.5*   CBC:  Recent Labs Lab 06/16/2015 1240 07/11/15 0413 07/12/15 0603 07/13/15 0625 07/14/15 0528  WBC 24.7* 24.2* 36.1* 44.2* 47.2*  NEUTROABS 21.2*  --   --   --   --   HGB 11.8* 11.0* 12.4* 14.0 14.2  HCT 35.3* 33.8* 39.3* 43.9 44.4  MCV 85.5 86.8 87.7 88.1 87.1  PLT 269 257 329 303 194   Cardiac Enzymes:  Recent Labs Lab 07/14/2015 1240  TROPONINI 0.11*   BNP (last 3 results)  Recent Labs  06/28/2015 1240  BNP 227.0*     CBG:  Recent Labs Lab 07/13/15 0808 07/13/15 1208 07/13/15 2113 07/14/15 0710 07/14/15 1131  GLUCAP 372* 222* 264* 314* 285*    Recent Results (from the past 240 hour(s))  C difficile quick scan w PCR reflex     Status: Abnormal   Collection Time: 06/16/2015  1:45 AM  Result Value Ref Range Status   C Diff antigen POSITIVE (A) NEGATIVE Final   C Diff toxin POSITIVE (A) NEGATIVE Final   C Diff interpretation   Final    Positive for toxigenic C. difficile, active toxin production present.    Comment: CRITICAL RESULT CALLED TO, READ BACK BY AND  VERIFIED WITH:  JACK DOUGHERTY AT 1458 07/11/15 SDR   Blood culture (routine x 2)     Status: None (Preliminary result)   Collection Time: 07/11/2015  2:10 PM  Result Value Ref Range Status   Specimen Description BLOOD RIGHT ASSIST CONTROL  Final   Special Requests BOTTLES DRAWN AEROBIC AND ANAEROBIC [place]  Final   Culture NO GROWTH 4 DAYS  Final   Report Status PENDING  Incomplete  Blood culture (routine x 2)     Status: None (Preliminary result)   Collection Time: 06/24/2015  2:10 PM  Result Value Ref Range Status   Specimen Description BLOOD RIGHT HAND  Final   Special Requests   Final    BOTTLES DRAWN AEROBIC AND ANAEROBIC [place]   Culture NO GROWTH 4 DAYS  Final   Report Status PENDING  Incomplete  MRSA PCR Screening     Status: Abnormal   Collection Time: 07/11/15  7:06 PM  Result Value Ref Range Status   MRSA by PCR POSITIVE (A) NEGATIVE Final    Comment:        The GeneXpert MRSA Assay (FDA approved for NASAL specimens only), is one component of a comprehensive MRSA colonization surveillance program. It is not intended to diagnose MRSA infection nor to guide or monitor treatment for MRSA infections. READ BACK AND VERIFIED BY MARCELLA TURNER AT 2120 07/11/2015.  TFK      Scheduled Meds: . acidophilus  1 capsule Oral BID  . antiseptic oral rinse  7 mL Mouth Rinse BID  . aspirin EC  81 mg Oral Daily  . atorvastatin  80 mg Oral QHS  . benzonatate  200 mg Oral TID  . budesonide (PULMICORT) nebulizer solution  0.25 mg Nebulization BID  . Chlorhexidine Gluconate Cloth  6 each Topical Q0600  . colesevelam  1,250 mg Oral BID WC  . feeding supplement (ENSURE ENLIVE)  237 mL Oral TID  . insulin aspart  0-20 Units Subcutaneous TID WC  . insulin glargine  14 Units Subcutaneous QHS  . ipratropium-albuterol  3 mL Nebulization BID  . lidocaine (PF)  5 mL Intradermal Once  . magic mouthwash  5 mL Oral TID   And  . lidocaine  5 mL Mouth/Throat TID  . LORazepam   0.25 mg Oral Once per day on Tue Thu Sat  . methylPREDNISolone (SOLU-MEDROL) injection  20 mg Intravenous Q24H  . metroNIDAZOLE  500 mg Oral Q8H  . mupirocin ointment  1 application Nasal BID  . ondansetron (ZOFRAN) IV  4 mg Intravenous Q6H  . sodium chloride flush  3 mL Intravenous Q12H  . vancomycin  500 mg Oral Q6H  . Warfarin - Pharmacist Dosing Inpatient  Does not apply q1800    Assessment/Plan:  1. Clinical sepsis present on admission. Initially thought to be secondary to pneumonia but now on thinking was all C. difficile colitis. Continue vancomycin orally and Flagyl oral. Continue standing dose Zofran.  2. End-stage renal disease on dialysis Tuesday Thursday Saturday 3. Relative hypotension on presentation. Patient was given fluid boluses. Dopamine ordered if needed. It was never started yesterday. 4. History of coronary artery disease with borderline troponin. Continue aspirin. Troponin elevation secondary to demand ischemia 5. COPD exacerbation. Decrease Solu-Medrol continue budesonide and and nebulizers 6. Dysphagia with silent aspiration speech therapy following. If there is an aspiration Flagyl should have some coverage. 7. Stage II decubiti buttock present on admission 8. Chronic chest wound. Seems to be healing well. 9. Critical care specialist to look into IV access.  Code Status:     Code Status Orders        Start     Ordered   06/24/2015 1602  Full code   Continuous     06/29/2015 1601    Code Status History    Date Active Date Inactive Code Status Order ID Comments User Context   This patient has a current code status but no historical code status.     Family Communication: Wife at the bedside Disposition Plan: To be determined.   Consultants:  Nephrology  Critical care specialist  Antibiotics:  Oral vancomycin  Oral Flagyl  Time spent: 24 minutes  [place], [place]

## 2015-07-14 NOTE — Progress Notes (Signed)
ANTICOAGULATION CONSULT NOTE - Follow up [place] for Warfarin  Indication: VTE prophylaxis  Allergies  Allergen Reactions  . Pletal [Cilostazol] Other (See Comments)    Reaction:  Dizziness    Patient Measurements: Height: 6' (182.9 cm) Weight: 157 lb 6.5 oz (71.4 kg) IBW/kg (Calculated) : 77.6  Vital Signs: Temp: 97 F (36.1 C) (06/30 1500) Temp Source: Axillary (06/30 1500) BP: 92/58 mmHg (06/30 1500) Pulse Rate: 128 (06/30 1500)  Labs:  Recent Labs  07/12/15 0603 07/13/15 0625 07/14/15 0528  HGB 12.4* 14.0 14.2  HCT 39.3* 43.9 44.4  PLT 329 303 194  LABPROT 30.2* 36.4* 38.7*  INR 2.95 3.78 4.06*  CREATININE 4.61* 6.04* 4.21*    Estimated Creatinine Clearance: 13.9 mL/min (by C-G formula based on Cr of 4.21).  Assessment: Pharmacy consulted to dose warfarin in this 82 year old male admitted with PNA and Cdiff. Currently also on metronidazole and oral vancomycin.  Pt was on warfarin 5 mg PO QHS at home.  INR on 6/26 was 4.25.   Goal of Therapy:  INR 2-3   Plan:  Patient received vitamin K 5mg  po due to needing line placement. Will obtain follow-up INR with am labs.    Pharmacy will continue to monitor and adjust per consult.    John Robinson L 07/14/2015,4:15 PM

## 2015-07-14 NOTE — Progress Notes (Signed)
eLink Physician-Brief Progress Note Patient Name: John Robinson DOB: [birth year] MRN: [id number]   Date of Service  07/14/2015  HPI/Events of Note  Normotension, sys 100 Lactic 2.2 No lactic greater 4 , no hypotension Therefore no 30 cc/kg nor lactic repeat needed - unless clinical change, BP etc  eICU Interventions       Intervention Category Intermediate Interventions: Hypovolemia - evaluation and management  Raylene Miyamoto. 07/14/2015, 6:46 PM

## 2015-07-14 NOTE — Consult Note (Signed)
Pharmacy Antibiotic Note  John Robinson is a 82 y.o. male admitted on 06/19/2015 with pneumonia. MD notes dysphagia and concern for aspiration PNA. Pharmacy has been consulted for C diff management.   Plan: Will continue current orders for Metronidazole 500mg  PO Q8hr and Vancomycin 500 mg PO q6h.   Height: 6' (182.9 cm) Weight: 157 lb 6.5 oz (71.4 kg) IBW/kg (Calculated) : 77.6  Temp (24hrs), Avg:97.9 F (36.6 C), Min:97 F (36.1 C), Max:98.5 F (36.9 C)   Recent Labs Lab 07/09/2015 1240 06/22/2015 1530 07/09/2015 1924 07/11/15 0413 07/12/15 0603 07/13/15 0625 07/14/15 0528  WBC 24.7*  --   --  24.2* 36.1* 44.2* 47.2*  CREATININE 6.88*  --   --  8.10* 4.61* 6.04* 4.21*  LATICACIDVEN  --  1.7 1.6  --   --   --   --     Estimated Creatinine Clearance: 13.9 mL/min (by C-G formula based on Cr of 4.21).    Allergies  Allergen Reactions  . Pletal [Cilostazol] Other (See Comments)    Reaction:  Dizziness    Antimicrobials this admission: vancomycin 6/26 >> 6/27 Azithromycin 6/26 >> 6/27 cefepime 6/26 >> 6/28 metronidazole 6/27 >> Vancomycin PO 6/27 >>   Dose adjustments this admission:  Microbiology results: 6/26 BCx: NGTD 6/26 cdiff: positive  6/27 Sputum Cx: pending  6/27 MRSA PCR: positive  Pharmacy will continue to monitor and adjust per consult.    Currie Paris  07/14/2015 4:17 PM

## 2015-07-14 NOTE — Progress Notes (Signed)
Subjective:  Patient admitted for coughing spells and shortness of breath. Also reported sore throat, greenish sputum, cold chills Patient underwent CABG at [facility], postop course complicated by sternal wound infection, requires rehabilitation for 6 weeks.  At home for about one week Feels extremely weak today.  Tested positive for C. Difficile infection Having significant amount of loose stools Appetite is poor BP is low, requiring fluid boluses Reports abdominal pain is slightly better today. 2 loose stools overnight Transferred to ICU stepdown    Objective:  Vital signs in last 24 hours:  Temp:  [97.1 F (36.2 C)-98.5 F (36.9 C)] 97.6 F (36.4 C) (06/30 0700) Pulse Rate:  [72-98] 91 (06/30 0700) Resp:  [14-28] 21 (06/30 0800) BP: (67-128)/(41-112) 82/55 mmHg (06/30 0800) SpO2:  [90 %-100 %] 98 % (06/30 0700) Weight:  [71.4 kg (157 lb 6.5 oz)] 71.4 kg (157 lb 6.5 oz) (06/29 1045)  Weight change:  Filed Weights   07/11/15 1515 07/11/15 1850 07/13/15 1045  Weight: 72.8 kg (160 lb 7.9 oz) 72.3 kg (159 lb 6.3 oz) 71.4 kg (157 lb 6.5 oz)    Intake/Output:    Intake/Output Summary (Last 24 hours) at 07/14/15 0925 Last data filed at 07/14/15 0600  Gross per 24 hour  Intake   1075 ml  Output  -1817 ml  Net   2892 ml     Physical Exam: General: No acute distress, laying in the bed  HEENT anicteric  Neck supple  Pulm/lungs Mild basilar crackles, normal respiratory effort,    CVS/Heart irregular, soft systolic murmur  Abdomen:  Soft, nontender  Extremities: No peripheral edema  Neurologic: Alert, oriented  Skin: bruises over arms  Access: Left IJ PermCath       Basic Metabolic Panel:   Recent Labs Lab 07/12/2015 1240 07/11/15 0413 07/12/15 0603 07/13/15 0625 07/14/15 0528  NA 137 139 135 138 139  K 4.1 5.3* 4.9 5.4* 4.8  CL 103 106 98* 102 103  CO2 21* 18* 24 22 26   GLUCOSE 214* 249* 357* 394* 282*  BUN 101* 212* 53* 73* 45*  CREATININE 6.88* 8.10* 4.61*  6.04* 4.21*  CALCIUM 8.5* 8.4* 7.7* 7.7* 7.8*     CBC:  Recent Labs Lab 06/17/2015 1240 07/11/15 0413 07/12/15 0603 07/13/15 0625 07/14/15 0528  WBC 24.7* 24.2* 36.1* 44.2* 47.2*  NEUTROABS 21.2*  --   --   --   --   HGB 11.8* 11.0* 12.4* 14.0 14.2  HCT 35.3* 33.8* 39.3* 43.9 44.4  MCV 85.5 86.8 87.7 88.1 87.1  PLT 269 257 329 303 194      Microbiology:  Recent Results (from the past 720 hour(s))  C difficile quick scan w PCR reflex     Status: Abnormal   Collection Time: 06/22/2015  1:45 AM  Result Value Ref Range Status   C Diff antigen POSITIVE (A) NEGATIVE Final   C Diff toxin POSITIVE (A) NEGATIVE Final   C Diff interpretation   Final    Positive for toxigenic C. difficile, active toxin production present.    Comment: CRITICAL RESULT CALLED TO, READ BACK BY AND VERIFIED WITH:  JACK DOUGHERTY AT [id number] 07/11/15 SDR   Blood culture (routine x 2)     Status: None (Preliminary result)   Collection Time: 06/20/2015  2:10 PM  Result Value Ref Range Status   Specimen Description BLOOD RIGHT ASSIST CONTROL  Final   Special Requests BOTTLES DRAWN AEROBIC AND ANAEROBIC [place]  Final   Culture NO GROWTH 3  DAYS  Final   Report Status PENDING  Incomplete  Blood culture (routine x 2)     Status: None (Preliminary result)   Collection Time: 06/20/2015  2:10 PM  Result Value Ref Range Status   Specimen Description BLOOD RIGHT HAND  Final   Special Requests   Final    BOTTLES DRAWN AEROBIC AND ANAEROBIC [place]   Culture NO GROWTH 3 DAYS  Final   Report Status PENDING  Incomplete  MRSA PCR Screening     Status: Abnormal   Collection Time: 07/11/15  7:06 PM  Result Value Ref Range Status   MRSA by PCR POSITIVE (A) NEGATIVE Final    Comment:        The GeneXpert MRSA Assay (FDA approved for NASAL specimens only), is one component of a comprehensive MRSA colonization surveillance program. It is not intended to diagnose MRSA infection nor to guide or monitor  treatment for MRSA infections. READ BACK AND VERIFIED BY MARCELLA TURNER AT 2120 07/11/2015.  TFK     Coagulation Studies:  Recent Labs  07/11/15 1330 07/12/15 0603 07/13/15 0625 07/14/15 0528  LABPROT 49.4* 30.2* 36.4* 38.7*  INR 5.67* 2.95 3.78 4.06*    Urinalysis: No results for input(s): COLORURINE, LABSPEC, PHURINE, GLUCOSEU, HGBUR, BILIRUBINUR, KETONESUR, PROTEINUR, UROBILINOGEN, NITRITE, LEUKOCYTESUR in the last 72 hours.  Invalid input(s): APPERANCEUR    Imaging: Dg Chest 1 View  07/13/2015  CLINICAL DATA:  Increased SOB, Recent heart surgery 3-4 weeks ago, hx MI, CAD, HTN EXAM: CHEST 1 VIEW COMPARISON:  06/25/2015 FINDINGS: Left-sided dialysis catheter tips overlie the lower superior vena cava and upper right atrium. The heart is enlarged. Status post median sternotomy and CABG. There is new opacification at the left lung base which completely obscures the left hemidiaphragm. No pulmonary edema. Small bilateral pleural effusions are present. IMPRESSION: 1. New opacification of the left lower lobe consistent with atelectasis or infiltrate. 2. Bilateral small effusions. Electronically Signed   By: Nolon Nations M.D.   On: 07/13/2015 09:13   Dg Abd Portable 2v  07/13/2015  CLINICAL DATA:  Diffuse abd pain, pt states he had heart 3-4 weeks ago EXAM: PORTABLE ABDOMEN - 2 VIEW COMPARISON:  07/14/2015 chest x-ray FINDINGS: Status post median sternotomy. Partially imaged dialysis catheter tips overlie the upper right atrium. There is dense opacity at the left lung base, obscuring the left hemidiaphragm, increased over prior. Contrast is identified within the stomach. Aortic stent graft is present. Bowel gas pattern is nonobstructed. No evidence for free intraperitoneal air. IMPRESSION: New opacification of the left lower lobe of the lung. Nonobstructive bowel gas pattern. Electronically Signed   By: Nolon Nations M.D.   On: 07/13/2015 09:12     Medications:   . sodium  chloride 50 mL/hr at 07/13/15 1900  . DOPamine Stopped (07/13/15 2100)   . acidophilus  1 capsule Oral BID  . antiseptic oral rinse  7 mL Mouth Rinse BID  . aspirin EC  81 mg Oral Daily  . atorvastatin  80 mg Oral QHS  . benzonatate  200 mg Oral TID  . budesonide (PULMICORT) nebulizer solution  0.25 mg Nebulization BID  . Chlorhexidine Gluconate Cloth  6 each Topical Q0600  . colesevelam  1,250 mg Oral BID WC  . feeding supplement (ENSURE ENLIVE)  237 mL Oral TID  . insulin aspart  0-5 Units Subcutaneous QHS  . insulin aspart  0-9 Units Subcutaneous TID WC  . insulin glargine  6 Units Subcutaneous QHS  . ipratropium-albuterol  3 mL Nebulization BID  . magic mouthwash  5 mL Oral TID   And  . lidocaine  5 mL Mouth/Throat TID  . LORazepam  0.25 mg Oral Once per day on Tue Thu Sat  . methylPREDNISolone (SOLU-MEDROL) injection  20 mg Intravenous Q24H  . metronidazole  500 mg Intravenous Q8H  . mupirocin ointment  1 application Nasal BID  . ondansetron (ZOFRAN) IV  4 mg Intravenous Q6H  . sodium chloride flush  3 mL Intravenous Q12H  . vancomycin  500 mg Oral Q6H  . Warfarin - Pharmacist Dosing Inpatient   Does not apply q1800   acetaminophen **OR** acetaminophen, lidocaine-prilocaine, morphine injection, nitroGLYCERIN, ondansetron (ZOFRAN) IV, traMADol  Assessment/ Plan:  82 y.o. male with end-stage renal disease, hemodialysis [address], Tuesday, Thursday, Saturday, history of severe vascular disease including aortic-iliac bypass,four-vessel CABG [facility] March 2017, postop wound infection, coronary disease, history of RCA stent, COPD  1. End-stage renal disease.  TuesdayThursday/Saturday.  [address] - dialysis as per his regular schedule - left arm access needs revision which would be scheduled after acute issues are over.  - Dialysis via permcath  2. Anemia of chronic kidney disease. Hemoglobin 14 at present (likely hemoconcentration) Hold Procrit   3.   Supratherapeutic INR  4. Sepsis with bilateral pneumonia Antibiotics as per primary team. May need to continue iv maintenance fluids due to ongoing losses from diarrhea Consider low dose pressors  5.  Secondary hyperparathyroidism Monitor phosphorus during this admission no binders listed on home meds  6. C. Difficile diarrhea and colitis Currently metronidazole IV, vancomycin oral   LOS: 4 Charisa Twitty 6/30/20179:25 AM

## 2015-07-14 NOTE — Progress Notes (Signed)
[place] Progess Note    ASSESSMENT/PLAN   82 year old male severe debility and deconditioning, status post long hospital admission for CABG,, complicated by sternal wound infection. Went home one week ago after 3 months of being in medical facilities, returned with C. difficile with sepsis.  PULMONARY A:Acute hypoxic respiratory failure -Severe, COPD/emphysema. -Chest x-ray images from 6/30. Reviewed, left lower lobe atelectasis, possible right middle lobe infiltrate, though this is seen on previous remote chest x-rays as well. P:   Continue to monitor respiratory status closely. -Currently on nasal cannula at 4 L. -At high risk for intubation.  CARDIOVASCULAR A: Hypotension, likely due to septic shock from C. difficile colitis. P:  -Patient appears intravascularly depleted, though he has evidence of third spacing. -Continue IV fluids, antibiotics. -May need to start patient on levofloxacin. BP remains low.   RENAL A:  CKD P:   Continue HD per nephro.   GASTROINTESTINAL A:  C difficile colitis P:   Continue IVF, abx.   HEMATOLOGIC A:  Leukocytosis, likely due to see DLCO with sepsis. P:  Continue to monitor.  INFECTIOUS A:  Leukocytosis with increasing white count, due to C. difficile with sepsis. P:   -Continue Flagyl and oral vancomycin for C. difficile sepsis. -Check pro calcitonin and monitor. -Recheck blood cultures. Given persistent hypotension.  CULTURES: 6/26 Blood culture>> negative as of 6/30 6/26 c-diff> Positive 6/27: MRSA screening PCR positive   ANTIBIOTICS vancomycin 6/26 >> 6/27 Azithromycin 6/26 >> 6/27 cefepime 6/26 >> 6/28 metronidazole 6/27 >> Vancomycin PO 6/27 >>    ENDOCRINE A:  Hyperglycemia.  P:   Will increase Levemir to 14 units daily at bedtime, and increase NovoLog correction scale.  NEUROLOGIC A:  -- --   MAJOR EVENTS/TEST RESULTS:   Best Practices  DVT Prophylaxis: [facility]  Prophylaxis: --   ---------------------------------------   ----------------------------------------   Name: John Robinson MRN: [id number] DOB: 1932-03-05    ADMISSION DATE:  07/01/2015     SUBJECTIVE:   Patient currently awake, alert, appears ill and fatigued.  Review of Systems:  Constitutional: Feels well. Cardiovascular: No chest pain.  Pulmonary: Denies dyspnea.   The remainder of systems were reviewed and were found to be negative other than what is documented in the HPI.    VITAL SIGNS: Temp:  [97 F (36.1 C)-98.5 F (36.9 C)] 97 F (36.1 C) (06/30 1500) Pulse Rate:  [55-128] 128 (06/30 1500) Resp:  [14-28] 21 (06/30 1500) BP: (69-128)/(47-112) 92/58 mmHg (06/30 1500) SpO2:  [77 %-100 %] 77 % (06/30 1500) HEMODYNAMICS:   VENTILATOR SETTINGS:   INTAKE / OUTPUT:  Intake/Output Summary (Last 24 hours) at 07/14/15 1630 Last data filed at 07/14/15 0600  Gross per 24 hour  Intake    975 ml  Output      1 ml  Net    974 ml    PHYSICAL EXAMINATION: Physical Examination:   VS: BP 92/58 mmHg  Pulse 128  Temp(Src) 97 F (36.1 C) (Axillary)  Resp 21  Ht 6' (1.829 m)  Wt 157 lb 6.5 oz (71.4 kg)  BMI 21.34 kg/m2  SpO2 77%  General Appearance: No distress  Neuro:without focal findings, mental status, Slow, but responsive HEENT: PERRLA, EOM intact. Pulmonary: Bilateral expiratory rhonchi. CardiovascularNormal S1,S2.  No m/r/g.   Abdomen: Benign, Soft, non-tender. Renal:  No costovertebral tenderness  GU:  Not performed at this time. Endocrine: No evident thyromegaly. Skin:   warm, no rashes, no ecchymosis  Extremities: normal, no  cyanosis, clubbing.   LABS:   LABORATORY PANEL:   CBC  Recent Labs Lab 07/14/15 0528  WBC 47.2*  HGB 14.2  HCT 44.4  PLT 194    Chemistries   Recent Labs Lab 07/14/2015 1240  07/14/15 0528  NA 137  < > 139  K 4.1  < > 4.8  CL 103  < > 103  CO2 21*  < > 26  GLUCOSE 214*  < > 282*  BUN 101*  < > 45*   CREATININE 6.88*  < > 4.21*  CALCIUM 8.5*  < > 7.8*  AST 17  --   --   ALT 17  --   --   ALKPHOS 91  --   --   BILITOT 0.5  --   --   < > = values in this interval not displayed.   Recent Labs Lab 07/12/15 2112 07/13/15 0808 07/13/15 1208 07/13/15 2113 07/14/15 0710 07/14/15 1131  GLUCAP 287* 372* 222* 264* 314* 285*   No results for input(s): PHART, PCO2ART, PO2ART in the last 168 hours.  Recent Labs Lab 06/15/2015 1240  AST 17  ALT 17  ALKPHOS 91  BILITOT 0.5  ALBUMIN 2.5*    Cardiac Enzymes  Recent Labs Lab 07/01/2015 1240  TROPONINI 0.11*    RADIOLOGY:  Dg Chest 1 View  07/13/2015  CLINICAL DATA:  Increased SOB, Recent heart surgery 3-4 weeks ago, hx MI, CAD, HTN EXAM: CHEST 1 VIEW COMPARISON:  06/29/2015 FINDINGS: Left-sided dialysis catheter tips overlie the lower superior vena cava and upper right atrium. The heart is enlarged. Status post median sternotomy and CABG. There is new opacification at the left lung base which completely obscures the left hemidiaphragm. No pulmonary edema. Small bilateral pleural effusions are present. IMPRESSION: 1. New opacification of the left lower lobe consistent with atelectasis or infiltrate. 2. Bilateral small effusions. Electronically Signed   By: Nolon Nations M.D.   On: 07/13/2015 09:13   Dg Chest Port 1 View  07/14/2015  CLINICAL DATA:  Central line placement EXAM: PORTABLE CHEST 1 VIEW COMPARISON:  07/14/2015 FINDINGS: Cardiomegaly again noted. Left IJ catheter with tip in right atrium is unchanged in position. There is right IJ central line with tip in distal SVC. No pneumothorax. Persistent small left pleural effusion with left basilar atelectasis or infiltrate. IMPRESSION: Left IJ catheter with tip in right atrium is unchanged in position. There is right IJ central line with tip in distal SVC. No pneumothorax. Persistent small left pleural effusion with left basilar atelectasis or infiltrate. Electronically Signed   By:  Lahoma Crocker M.D.   On: 07/14/2015 14:43   Dg Chest Port 1 View  07/14/2015  CLINICAL DATA:  Shortness of breath.  Productive cough. EXAM: PORTABLE CHEST 1 VIEW COMPARISON:  07/13/2015 FINDINGS: Cardiomegaly. Prior CABG. Left dialysis catheter remains in place, unchanged. Left lower lobe atelectasis or infiltrate. Increasing right medial basilar airspace opacity as well. Possible small left effusion. No acute bony abnormality. IMPRESSION: Continued left lower lobe atelectasis or infiltrate. Suspect small left effusion. Increasing right medial basilar airspace opacity. Electronically Signed   By: Rolm Baptise M.D.   On: 07/14/2015 12:23   Dg Abd Portable 2v  07/13/2015  CLINICAL DATA:  Diffuse abd pain, pt states he had heart 3-4 weeks ago EXAM: PORTABLE ABDOMEN - 2 VIEW COMPARISON:  07/14/2015 chest x-ray FINDINGS: Status post median sternotomy. Partially imaged dialysis catheter tips overlie the upper right atrium. There is dense opacity at the left  lung base, obscuring the left hemidiaphragm, increased over prior. Contrast is identified within the stomach. Aortic stent graft is present. Bowel gas pattern is nonobstructed. No evidence for free intraperitoneal air. IMPRESSION: New opacification of the left lower lobe of the lung. Nonobstructive bowel gas pattern. Electronically Signed   By: Nolon Nations M.D.   On: 07/13/2015 09:12       --Marda Stalker, MD.  ICU Pager: [phone] [facility] Office Number: [phone]  Patricia Pesa, M.D.  Vilinda Boehringer, M.D.  Merton Border, M.D  07/14/2015   Critical Care Attestation.  I have personally obtained a history, examined the patient, evaluated laboratory and imaging results, formulated the assessment and plan and placed orders. The Patient requires high complexity decision making for assessment and support, frequent evaluation and titration of therapies, application of advanced monitoring technologies and extensive  interpretation of multiple databases. The patient has critical illness that could lead imminently to failure of 1 or more organ systems and requires the highest level of physician preparedness to intervene.  Critical Care Time devoted to patient care services described in this note is 60 minutes and is exclusive of time spent in procedures.

## 2015-07-14 NOTE — Progress Notes (Signed)
eLink Physician-Brief Progress Note Patient Name: John Robinson DOB: 1932-05-04 MRN: [id number]   Date of Service  07/14/2015  HPI/Events of Note  rn concern about distress after bolus Camera shows min to no distress  eICU Interventions  pcxr for assessment edema     Intervention Category Intermediate Interventions: Respiratory distress - evaluation and management  Raylene Miyamoto. 07/14/2015, 5:48 PM

## 2015-07-15 ENCOUNTER — Inpatient Hospital Stay: Payer: Commercial Managed Care - HMO

## 2015-07-15 LAB — CBC
HCT: 41.3 % (ref 40.0–52.0)
HEMOGLOBIN: 12.8 g/dL — AB (ref 13.0–18.0)
MCH: 27.8 pg (ref 26.0–34.0)
MCHC: 31 g/dL — ABNORMAL LOW (ref 32.0–36.0)
MCV: 89.7 fL (ref 80.0–100.0)
Platelets: 151 10*3/uL (ref 150–440)
RBC: 4.6 MIL/uL (ref 4.40–5.90)
RDW: 21 % — ABNORMAL HIGH (ref 11.5–14.5)
WBC: 44.8 10*3/uL — ABNORMAL HIGH (ref 3.8–10.6)

## 2015-07-15 LAB — CULTURE, BLOOD (ROUTINE X 2)
Culture: NO GROWTH
Culture: NO GROWTH

## 2015-07-15 LAB — PROCALCITONIN: PROCALCITONIN: 2.42 ng/mL

## 2015-07-15 LAB — MAGNESIUM: Magnesium: 2.3 mg/dL (ref 1.7–2.4)

## 2015-07-15 LAB — GLUCOSE, CAPILLARY
GLUCOSE-CAPILLARY: 246 mg/dL — AB (ref 65–99)
GLUCOSE-CAPILLARY: 129 mg/dL — AB (ref 65–99)
GLUCOSE-CAPILLARY: 158 mg/dL — AB (ref 65–99)
Glucose-Capillary: 301 mg/dL — ABNORMAL HIGH (ref 65–99)

## 2015-07-15 LAB — PROTIME-INR
INR: 3.45
PROTHROMBIN TIME: 34 s — AB (ref 11.4–15.0)

## 2015-07-15 LAB — BASIC METABOLIC PANEL
ANION GAP: 10 (ref 5–15)
BUN: 66 mg/dL — ABNORMAL HIGH (ref 6–20)
CALCIUM: 7.8 mg/dL — AB (ref 8.9–10.3)
CO2: 25 mmol/L (ref 22–32)
Chloride: 103 mmol/L (ref 101–111)
Creatinine, Ser: 5.48 mg/dL — ABNORMAL HIGH (ref 0.61–1.24)
GFR, EST AFRICAN AMERICAN: 10 mL/min — AB (ref >60)
GFR, EST NON AFRICAN AMERICAN: 9 mL/min — AB (ref >60)
GLUCOSE: 304 mg/dL — AB (ref 65–99)
Potassium: 5.5 mmol/L — ABNORMAL HIGH (ref 3.5–5.1)
Sodium: 138 mmol/L (ref 135–145)

## 2015-07-15 LAB — PHOSPHORUS: PHOSPHORUS: 7.1 mg/dL — AB (ref 2.5–4.6)

## 2015-07-15 LAB — CORTISOL-AM, BLOOD: CORTISOL - AM: 9.8 ug/dL (ref 6.7–22.6)

## 2015-07-15 MED ORDER — METRONIDAZOLE IN NACL 5-0.79 MG/ML-% IV SOLN
500.0000 mg | Freq: Three times a day (TID) | INTRAVENOUS | Status: DC
  Administered 2015-07-15 – 2015-07-20 (×15): 500 mg via INTRAVENOUS
  Filled 2015-07-15 (×17): qty 100

## 2015-07-15 MED ORDER — LACTATED RINGERS IV BOLUS (SEPSIS)
250.0000 mL | Freq: Once | INTRAVENOUS | Status: AC
  Administered 2015-07-15: 250 mL via INTRAVENOUS

## 2015-07-15 MED ORDER — SODIUM CHLORIDE 0.9 % IV SOLN
INTRAVENOUS | Status: DC
  Administered 2015-07-15: 12:00:00 via INTRAVENOUS

## 2015-07-15 MED ORDER — INSULIN GLARGINE 100 UNIT/ML ~~LOC~~ SOLN
20.0000 [IU] | Freq: Every day | SUBCUTANEOUS | Status: DC
  Administered 2015-07-15 – 2015-07-19 (×5): 20 [IU] via SUBCUTANEOUS
  Filled 2015-07-15 (×6): qty 0.2

## 2015-07-15 NOTE — Consult Note (Addendum)
Pharmacy Antibiotic Note  John Robinson is a 82 y.o. male admitted on 06/27/2015 with pneumonia. MD notes dysphagia and concern for aspiration PNA. Pharmacy has been consulted for C diff management. Hemodialysis patient.   Plan: Will continue current orders for Metronidazole 500mg  PO Q8hr and Vancomycin 500 mg PO q6h.   Height: 6' (182.9 cm) Weight: 157 lb 6.5 oz (71.4 kg) IBW/kg (Calculated) : 77.6  Temp (24hrs), Avg:97.4 F (36.3 C), Min:97 F (36.1 C), Max:97.8 F (36.6 C)   Recent Labs Lab 07/01/2015 1530 06/22/2015 1924 07/11/15 0413 07/12/15 0603 07/13/15 0625 07/14/15 0528 07/14/15 1730 07/15/15 0628  WBC  --   --  24.2* 36.1* 44.2* 47.2*  --  44.8*  CREATININE  --   --  8.10* 4.61* 6.04* 4.21* 4.84* 5.48*  LATICACIDVEN 1.7 1.6  --   --   --   --  2.2*  --     Estimated Creatinine Clearance: 10.7 mL/min (by C-G formula based on Cr of 5.48).    Allergies  Allergen Reactions  . Pletal [Cilostazol] Other (See Comments)    Reaction:  Dizziness    Antimicrobials this admission: vancomycin 6/26 >> 6/27 Azithromycin 6/26 >> 6/27 cefepime 6/26 >> 6/28 Metronidazole 6/27 >> Vancomycin PO 6/27 >>   Dose adjustments this admission:  Microbiology results: 6/26 BCx: NGTD 6/26 cdiff: positive  6/27 Sputum Cx: pending  6/27 MRSA PCR: positive  Pharmacy will continue to monitor and adjust per consult.    Chinita Greenland PharmD Clinical Pharmacist 07/15/2015  07/15/2015 10:03 AM

## 2015-07-15 NOTE — Progress Notes (Signed)
Patient ID: John Robinson, male   DOB: 1932-01-24, 82 y.o.   MRN: [id number] [facility] PROGRESS NOTE  Kyshon Steimer [id number] DOB: [birth year] DOA: 06/23/2015 PCP: Lelon Huh, MD  HPI/Subjective: Patient answers some yes or no questions. Not feeling well but did not elaborate. Some cough, some shortness of breath, some diarrhea.  Objective: Filed Vitals:   07/15/15 0800 07/15/15 1000  BP: 80/58   Pulse: 94 88  Temp:    Resp: 21 15    Filed Weights   07/11/15 1515 07/11/15 1850 07/13/15 1045  Weight: 72.8 kg (160 lb 7.9 oz) 72.3 kg (159 lb 6.3 oz) 71.4 kg (157 lb 6.5 oz)    ROS: Review of Systems  Constitutional: Negative for fever and chills.  Eyes: Negative for blurred vision.  Respiratory: Positive for cough and shortness of breath.   Cardiovascular: Negative for chest pain.  Gastrointestinal: Positive for diarrhea. Negative for nausea, vomiting, abdominal pain and constipation.  Genitourinary: Negative for dysuria.  Musculoskeletal: Negative for joint pain.  Neurological: Negative for dizziness and headaches.   Exam: Physical Exam  Constitutional: He is oriented to person, place, and time.  HENT:  Nose: No mucosal edema.  Mouth/Throat: No oropharyngeal exudate or posterior oropharyngeal edema.  Eyes: Conjunctivae, EOM and lids are normal. Pupils are equal, round, and reactive to light.  Neck: No JVD present. Carotid bruit is not present. No edema present. No thyroid mass and no thyromegaly present.  Cardiovascular: S1 normal and S2 normal.  Exam reveals no gallop.   No murmur heard. Pulses:      Dorsalis pedis pulses are 2+ on the right side, and 2+ on the left side.  Respiratory: No respiratory distress. He has wheezes in the right lower field and the left lower field. He has no rhonchi. He has no rales.  GI: Soft. Bowel sounds are normal. There is generalized tenderness.  Musculoskeletal:       Right ankle: He exhibits no swelling.       Left  ankle: He exhibits no swelling.  Lymphadenopathy:    He has no cervical adenopathy.  Neurological: He is alert and oriented to person, place, and time. No cranial nerve deficit.  Skin: Skin is warm. No rash noted. Nails show no clubbing.  Bruising upper and lower extremities.  Psychiatric: He has a normal mood and affect.      Data Reviewed: Basic Metabolic Panel:  Recent Labs Lab 07/12/15 0603 07/13/15 0625 07/14/15 0528 07/14/15 1730 07/15/15 0628  NA 135 138 139 139 138  K 4.9 5.4* 4.8 4.5 5.5*  CL 98* 102 103 104 103  CO2 24 22 26 25 25   GLUCOSE 357* 394* 282* 209* 304*  BUN 53* 73* 45* 54* 66*  CREATININE 4.61* 6.04* 4.21* 4.84* 5.48*  CALCIUM 7.7* 7.7* 7.8* 7.6* 7.8*  MG  --   --   --  2.1 2.3  PHOS  --   --   --  6.3* 7.1*   Liver Function Tests:  Recent Labs Lab 06/17/2015 1240  AST 17  ALT 17  ALKPHOS 91  BILITOT 0.5  PROT 5.9*  ALBUMIN 2.5*   CBC:  Recent Labs Lab 06/21/2015 1240 07/11/15 0413 07/12/15 0603 07/13/15 0625 07/14/15 0528 07/14/15 1730 07/15/15 0628  WBC 24.7* 24.2* 36.1* 44.2* 47.2*  --  44.8*  NEUTROABS 21.2*  --   --   --   --   --   --   HGB 11.8* 11.0* 12.4* 14.0 14.2  12.5* 12.8*  HCT 35.3* 33.8* 39.3* 43.9 44.4 39.7* 41.3  MCV 85.5 86.8 87.7 88.1 87.1  --  89.7  PLT 269 257 329 303 194  --  151   Cardiac Enzymes:  Recent Labs Lab 06/18/2015 1240  TROPONINI 0.11*   BNP (last 3 results)  Recent Labs  06/22/2015 1240  BNP 227.0*     CBG:  Recent Labs Lab 07/14/15 0710 07/14/15 1131 07/14/15 1644 07/14/15 2131 07/15/15 0708  GLUCAP 314* 285* 191* 217* 301*    Recent Results (from the past 240 hour(s))  C difficile quick scan w PCR reflex     Status: Abnormal   Collection Time: 07/09/2015  1:45 AM  Result Value Ref Range Status   C Diff antigen POSITIVE (A) NEGATIVE Final   C Diff toxin POSITIVE (A) NEGATIVE Final   C Diff interpretation   Final    Positive for toxigenic C. difficile, active toxin  production present.    Comment: CRITICAL RESULT CALLED TO, READ BACK BY AND VERIFIED WITH:  JACK DOUGHERTY AT 1458 07/11/15 SDR   Blood culture (routine x 2)     Status: None   Collection Time: 06/25/2015  2:10 PM  Result Value Ref Range Status   Specimen Description BLOOD RIGHT ASSIST CONTROL  Final   Special Requests BOTTLES DRAWN AEROBIC AND ANAEROBIC [place]  Final   Culture NO GROWTH 5 DAYS  Final   Report Status 07/15/2015 FINAL  Final  Blood culture (routine x 2)     Status: None   Collection Time: 06/29/2015  2:10 PM  Result Value Ref Range Status   Specimen Description BLOOD RIGHT HAND  Final   Special Requests   Final    BOTTLES DRAWN AEROBIC AND ANAEROBIC [place]   Culture NO GROWTH 5 DAYS  Final   Report Status 07/15/2015 FINAL  Final  MRSA PCR Screening     Status: Abnormal   Collection Time: 07/11/15  7:06 PM  Result Value Ref Range Status   MRSA by PCR POSITIVE (A) NEGATIVE Final    Comment:        The GeneXpert MRSA Assay (FDA approved for NASAL specimens only), is one component of a comprehensive MRSA colonization surveillance program. It is not intended to diagnose MRSA infection nor to guide or monitor treatment for MRSA infections. READ BACK AND VERIFIED BY MARCELLA TURNER AT 2120 07/11/2015.  TFK      Scheduled Meds: . acidophilus  1 capsule Oral BID  . antiseptic oral rinse  7 mL Mouth Rinse BID  . aspirin EC  81 mg Oral Daily  . atorvastatin  80 mg Oral QHS  . benzonatate  200 mg Oral TID  . budesonide (PULMICORT) nebulizer solution  0.25 mg Nebulization BID  . Chlorhexidine Gluconate Cloth  6 each Topical Q0600  . feeding supplement (ENSURE ENLIVE)  237 mL Oral TID  . insulin aspart  0-20 Units Subcutaneous TID WC  . insulin glargine  20 Units Subcutaneous QHS  . ipratropium-albuterol  3 mL Nebulization BID  . lidocaine (PF)  5 mL Intradermal Once  . magic mouthwash  5 mL Oral TID   And  . lidocaine  5 mL Mouth/Throat TID  .  LORazepam  0.25 mg Oral Once per day on Tue Thu Sat  . methylPREDNISolone (SOLU-MEDROL) injection  20 mg Intravenous Q24H  . metroNIDAZOLE  500 mg Oral Q8H  . mupirocin ointment  1 application Nasal BID  . ondansetron (ZOFRAN) IV  4 mg  Intravenous Q6H  . sodium chloride flush  3 mL Intravenous Q12H  . vancomycin  500 mg Oral Q6H  . Warfarin - Pharmacist Dosing Inpatient   Does not apply q1800    Assessment/Plan:  1. Clinical sepsis present on admission. C. difficile colitis. Continue vancomycin orally and Flagyl IV. Continue standing dose Zofran. Spoke with pharmacist about the possibility of dificid treatment but they recommended a few more days of the current treatment before switching. 2. End-stage renal disease on dialysis Tuesday Thursday Saturday 3. Relative hypotension on presentation. Patient was given fluid boluses.  4. History of coronary artery disease with borderline troponin. Continue aspirin. Troponin elevation secondary to demand ischemia 5. COPD exacerbation. Decrease Solu-Medrol continue budesonide and and nebulizers 6. Dysphagia with silent aspiration speech therapy following. If there is an aspiration Flagyl should have some coverage. 7. Stage II decubiti buttock present on admission 8. Chronic chest wound. Seems to be healing well. 9. Acute respiratory failure. Oxygen requirements going up to 4 L  Code Status:     Code Status Orders        Start     Ordered   07/07/2015 1602  Full code   Continuous     06/15/2015 1601    Code Status History    Date Active Date Inactive Code Status Order ID Comments User Context   This patient has a current code status but no historical code status.     Family Communication: Wife at the bedside Disposition Plan: To be determined.   Consultants:  Nephrology  Critical care specialist  Antibiotics:  Oral vancomycin  IV Flagyl  Time spent: 28 minutes  [place], [place]

## 2015-07-15 NOTE — Progress Notes (Signed)
Subjective:  Patient admitted for coughing spells and shortness of breath. Also reported sore throat, greenish sputum, cold chills Patient underwent CABG at [facility], postop course complicated by sternal wound infection, requires rehabilitation for 6 weeks.  At home for about one week Tested positive for C. Difficile infection Having significant amount of loose stools Appetite is poor BP is low, requiring fluid boluses Reports abdominal pain is continued. No loose stools overnight    Objective:  Vital signs in last 24 hours:  Temp:  [97 F (36.1 C)-97.8 F (36.6 C)] 97.7 F (36.5 C) (07/01 0400) Pulse Rate:  [55-128] 89 (07/01 0400) Resp:  [12-23] 14 (07/01 0400) BP: (66-110)/(36-87) 82/50 mmHg (07/01 0400) SpO2:  [77 %-100 %] 100 % (07/01 0400)  Weight change:  Filed Weights   07/11/15 1515 07/11/15 1850 07/13/15 1045  Weight: 72.8 kg (160 lb 7.9 oz) 72.3 kg (159 lb 6.3 oz) 71.4 kg (157 lb 6.5 oz)    Intake/Output:    Intake/Output Summary (Last 24 hours) at 07/15/15 [id number] Last data filed at 07/14/15 2200  Gross per 24 hour  Intake    237 ml  Output      0 ml  Net    237 ml     Physical Exam: General: No acute distress, laying in the bed  HEENT anicteric  Neck supple  Pulm/lungs B/l diffuse crackles, normal respiratory effort,    CVS/Heart irregular, soft systolic murmur  Abdomen:  Soft, nontender  Extremities: No peripheral edema  Neurologic: Alert, oriented  Skin: bruises over arms  Access: Left IJ PermCath       Basic Metabolic Panel:   Recent Labs Lab 07/12/15 0603 07/13/15 0625 07/14/15 0528 07/14/15 1730 07/15/15 0628  NA 135 138 139 139 138  K 4.9 5.4* 4.8 4.5 5.5*  CL 98* 102 103 104 103  CO2 24 22 26 25 25   GLUCOSE 357* 394* 282* 209* 304*  BUN 53* 73* 45* 54* 66*  CREATININE 4.61* 6.04* 4.21* 4.84* 5.48*  CALCIUM 7.7* 7.7* 7.8* 7.6* 7.8*  MG  --   --   --  2.1 2.3  PHOS  --   --   --  6.3* 7.1*     CBC:  Recent Labs Lab  06/24/2015 1240 07/11/15 0413 07/12/15 0603 07/13/15 0625 07/14/15 0528 07/14/15 1730 07/15/15 0628  WBC 24.7* 24.2* 36.1* 44.2* 47.2*  --  44.8*  NEUTROABS 21.2*  --   --   --   --   --   --   HGB 11.8* 11.0* 12.4* 14.0 14.2 12.5* 12.8*  HCT 35.3* 33.8* 39.3* 43.9 44.4 39.7* 41.3  MCV 85.5 86.8 87.7 88.1 87.1  --  89.7  PLT 269 257 329 303 194  --  151      Microbiology:  Recent Results (from the past 720 hour(s))  C difficile quick scan w PCR reflex     Status: Abnormal   Collection Time: 06/23/2015  1:45 AM  Result Value Ref Range Status   C Diff antigen POSITIVE (A) NEGATIVE Final   C Diff toxin POSITIVE (A) NEGATIVE Final   C Diff interpretation   Final    Positive for toxigenic C. difficile, active toxin production present.    Comment: CRITICAL RESULT CALLED TO, READ BACK BY AND VERIFIED WITHBarnabas Lister DOUGHERTY AT [id number] 07/11/15 SDR   Blood culture (routine x 2)     Status: None   Collection Time: 07/07/2015  2:10 PM  Result Value Ref Range  Status   Specimen Description BLOOD RIGHT ASSIST CONTROL  Final   Special Requests BOTTLES DRAWN AEROBIC AND ANAEROBIC [place]  Final   Culture NO GROWTH 5 DAYS  Final   Report Status 07/15/2015 FINAL  Final  Blood culture (routine x 2)     Status: None   Collection Time: 06/29/2015  2:10 PM  Result Value Ref Range Status   Specimen Description BLOOD RIGHT HAND  Final   Special Requests   Final    BOTTLES DRAWN AEROBIC AND ANAEROBIC [place]   Culture NO GROWTH 5 DAYS  Final   Report Status 07/15/2015 FINAL  Final  MRSA PCR Screening     Status: Abnormal   Collection Time: 07/11/15  7:06 PM  Result Value Ref Range Status   MRSA by PCR POSITIVE (A) NEGATIVE Final    Comment:        The GeneXpert MRSA Assay (FDA approved for NASAL specimens only), is one component of a comprehensive MRSA colonization surveillance program. It is not intended to diagnose MRSA infection nor to guide or monitor treatment for MRSA  infections. READ BACK AND VERIFIED BY MARCELLA TURNER AT 2120 07/11/2015.  TFK     Coagulation Studies:  Recent Labs  07/13/15 0625 07/14/15 0528 07/15/15 0628  LABPROT 36.4* 38.7* 34.0*  INR 3.78 4.06* 3.45    Urinalysis: No results for input(s): COLORURINE, LABSPEC, PHURINE, GLUCOSEU, HGBUR, BILIRUBINUR, KETONESUR, PROTEINUR, UROBILINOGEN, NITRITE, LEUKOCYTESUR in the last 72 hours.  Invalid input(s): APPERANCEUR    Imaging: Dg Chest 1 View  07/14/2015  CLINICAL DATA:  Shortness of breath this evening. EXAM: CHEST 1 VIEW COMPARISON:  07/13/2015 and 07/14/2015 FINDINGS: Sternotomy wires unchanged. Left IJ dialysis catheter and right IJ central venous catheter unchanged. Lungs are hypoinflated with stable left base opacification likely small effusion and associated atelectasis. Mild opacification over the medial right base unchanged which may also be due to small amount of pleural fluid and atelectasis. Cannot exclude infection in the lung bases. Mild stable cardiomegaly. Calcified plaque over the aortic arch stable stent in the left axillary region. IMPRESSION: Stable medial right base opacification and stable left base opacification as findings may be due to small amount of bilateral pleural fluid and associated atelectasis. Cannot exclude infection in the lung bases. Stable mild cardiomegaly. Aortic atherosclerosis. Tubes and lines as described. Electronically Signed   By: Marin Olp M.D.   On: 07/14/2015 20:44   Dg Chest 1 View  07/13/2015  CLINICAL DATA:  Increased SOB, Recent heart surgery 3-4 weeks ago, hx MI, CAD, HTN EXAM: CHEST 1 VIEW COMPARISON:  06/23/2015 FINDINGS: Left-sided dialysis catheter tips overlie the lower superior vena cava and upper right atrium. The heart is enlarged. Status post median sternotomy and CABG. There is new opacification at the left lung base which completely obscures the left hemidiaphragm. No pulmonary edema. Small bilateral pleural effusions are  present. IMPRESSION: 1. New opacification of the left lower lobe consistent with atelectasis or infiltrate. 2. Bilateral small effusions. Electronically Signed   By: Nolon Nations M.D.   On: 07/13/2015 09:13   Dg Chest Port 1 View  07/15/2015  CLINICAL DATA:  Dyspnea EXAM: PORTABLE CHEST 1 VIEW COMPARISON:  07/14/2015 FINDINGS: Cardiac shadow is stable. A dialysis catheter and right jugular catheter are again seen and stable. Postsurgical changes are again noted. Questionable pleural line is noted laterally on the right. This may be related to an underlying skin fold. Bibasilar atelectatic changes are noted with a small left effusion. No  other focal abnormality is noted. Left vascular stenting is identified. IMPRESSION: Stable bibasilar changes. Questionable pleural line laterally on the right. This was not seen on the previous exam and is likely related to a skin fold. Continued follow-up is recommended. These results will be called to the ordering clinician or representative by the Radiologist Assistant, and communication documented in the PACS or zVision Dashboard. Electronically Signed   By: Inez Catalina M.D.   On: 07/15/2015 07:30   Dg Chest Port 1 View  07/14/2015  CLINICAL DATA:  Central line placement EXAM: PORTABLE CHEST 1 VIEW COMPARISON:  07/14/2015 FINDINGS: Cardiomegaly again noted. Left IJ catheter with tip in right atrium is unchanged in position. There is right IJ central line with tip in distal SVC. No pneumothorax. Persistent small left pleural effusion with left basilar atelectasis or infiltrate. IMPRESSION: Left IJ catheter with tip in right atrium is unchanged in position. There is right IJ central line with tip in distal SVC. No pneumothorax. Persistent small left pleural effusion with left basilar atelectasis or infiltrate. Electronically Signed   By: Lahoma Crocker M.D.   On: 07/14/2015 14:43   Dg Chest Port 1 View  07/14/2015  CLINICAL DATA:  Shortness of breath.  Productive cough.  EXAM: PORTABLE CHEST 1 VIEW COMPARISON:  07/13/2015 FINDINGS: Cardiomegaly. Prior CABG. Left dialysis catheter remains in place, unchanged. Left lower lobe atelectasis or infiltrate. Increasing right medial basilar airspace opacity as well. Possible small left effusion. No acute bony abnormality. IMPRESSION: Continued left lower lobe atelectasis or infiltrate. Suspect small left effusion. Increasing right medial basilar airspace opacity. Electronically Signed   By: Rolm Baptise M.D.   On: 07/14/2015 12:23   Dg Abd Portable 2v  07/13/2015  CLINICAL DATA:  Diffuse abd pain, pt states he had heart 3-4 weeks ago EXAM: PORTABLE ABDOMEN - 2 VIEW COMPARISON:  06/23/2015 chest x-ray FINDINGS: Status post median sternotomy. Partially imaged dialysis catheter tips overlie the upper right atrium. There is dense opacity at the left lung base, obscuring the left hemidiaphragm, increased over prior. Contrast is identified within the stomach. Aortic stent graft is present. Bowel gas pattern is nonobstructed. No evidence for free intraperitoneal air. IMPRESSION: New opacification of the left lower lobe of the lung. Nonobstructive bowel gas pattern. Electronically Signed   By: Nolon Nations M.D.   On: 07/13/2015 09:12     Medications:   . DOPamine Stopped (07/13/15 2100)   . acidophilus  1 capsule Oral BID  . antiseptic oral rinse  7 mL Mouth Rinse BID  . aspirin EC  81 mg Oral Daily  . atorvastatin  80 mg Oral QHS  . benzonatate  200 mg Oral TID  . budesonide (PULMICORT) nebulizer solution  0.25 mg Nebulization BID  . Chlorhexidine Gluconate Cloth  6 each Topical Q0600  . feeding supplement (ENSURE ENLIVE)  237 mL Oral TID  . insulin aspart  0-20 Units Subcutaneous TID WC  . insulin glargine  14 Units Subcutaneous QHS  . ipratropium-albuterol  3 mL Nebulization BID  . lidocaine (PF)  5 mL Intradermal Once  . magic mouthwash  5 mL Oral TID   And  . lidocaine  5 mL Mouth/Throat TID  . LORazepam  0.25 mg  Oral Once per day on Tue Thu Sat  . methylPREDNISolone (SOLU-MEDROL) injection  20 mg Intravenous Q24H  . metroNIDAZOLE  500 mg Oral Q8H  . mupirocin ointment  1 application Nasal BID  . ondansetron (ZOFRAN) IV  4 mg Intravenous Q6H  .  sodium chloride flush  3 mL Intravenous Q12H  . vancomycin  500 mg Oral Q6H  . Warfarin - Pharmacist Dosing Inpatient   Does not apply q1800   acetaminophen **OR** acetaminophen, lidocaine-prilocaine, morphine injection, nitroGLYCERIN, ondansetron (ZOFRAN) IV, traMADol  Assessment/ Plan:  82 y.o. male with end-stage renal disease, hemodialysis [address], Tuesday, Thursday, Saturday, history of severe vascular disease including aortic-iliac bypass,four-vessel CABG at [facility] March 2017, postop wound infection, coronary disease, history of RCA stent, COPD  1. End-stage renal disease.  TuesdayThursday/Saturday.  [address] - dialysis as per his regular schedule - left arm access needs revision which would be scheduled after acute issues are over.  - Dialysis via permcath  2. Anemia of chronic kidney disease. Hemoglobin 12.8 at present (likely hemoconcentration) Hold Procrit   3.  Supratherapeutic INR  4. Sepsis   Antibiotics as per primary team. May need to continue iv maintenance fluids due to ongoing losses from diarrhea Consider low dose pressors   5.  Secondary hyperparathyroidism Monitor phosphorus during this admission no binders listed on home meds Phos high at 7.1  6. C. Difficile diarrhea and colitis Currently metronidazole IV, vancomycin oral   LOS: 5 Treveon Bourcier 7/1/20178:33 AM

## 2015-07-15 NOTE — Progress Notes (Signed)
ANTICOAGULATION CONSULT NOTE - Follow up [place] for Warfarin  Indication: VTE prophylaxis  Allergies  Allergen Reactions  . Pletal [Cilostazol] Other (See Comments)    Reaction:  Dizziness    Patient Measurements: Height: 6' (182.9 cm) Weight: 157 lb 6.5 oz (71.4 kg) IBW/kg (Calculated) : 77.6  Vital Signs: Temp: 97.7 F (36.5 C) (07/01 0400) Temp Source: Axillary (07/01 0400) BP: 82/50 mmHg (07/01 0400) Pulse Rate: 89 (07/01 0400)  Labs:  Recent Labs  07/13/15 0625 07/14/15 0528 07/14/15 1730 07/15/15 0628  HGB 14.0 14.2 12.5* 12.8*  HCT 43.9 44.4 39.7* 41.3  PLT 303 194  --  151  LABPROT 36.4* 38.7*  --  34.0*  INR 3.78 4.06*  --  3.45  CREATININE 6.04* 4.21* 4.84* 5.48*    Estimated Creatinine Clearance: 10.7 mL/min (by C-G formula based on Cr of 5.48).  Assessment: Pharmacy consulted to dose warfarin in this 82 year old male admitted with PNA and Cdiff. Currently also on metronidazole and oral vancomycin.  Pt was on warfarin 5 mg PO QHS at home.  INR on 6/26 was 4.25.   6/28 INR  2.95  Warfarin 2 mg 6/29 INR  3.78  none 6/30 INR  4.06  none 7/1   INR  3.45  Goal of Therapy:  INR 2-3   Plan:  6/30- Patient received vitamin K 5mg  po due to needing line placement. Will obtain follow-up INR with am labs.    7/1- Will continue to hold Warfarin at this time. F/u INR w/ am labs. (on Metronidazole/Vanc po)  Pharmacy will continue to monitor and adjust per consult.    Celsa Nordahl A 07/15/2015,9:52 AM

## 2015-07-15 NOTE — Progress Notes (Signed)
[facility] Progess Note    ASSESSMENT/PLAN   82 year old male severe debility, ESRD and deconditioning, status post long hospital admission for CABG, complicated by sternal wound infection. Went home one week ago after 3 months of being in medical facilities, returned with C. difficile with sepsis.  PULMONARY A:Acute hypoxic respiratory failure -Severe, COPD/emphysema. -Chest x-ray images from 6/30. Reviewed, left lower lobe atelectasis, possible right middle lobe infiltrate, though this is seen on previous remote chest x-rays as well. P:   Continue to monitor respiratory status closely. -Currently on nasal cannula at 4 L. -At high risk for intubation.  CARDIOVASCULAR A: Hypotension, likely due to septic shock from C. difficile colitis. -Continued low borderline blood pressure P:  -Patient appears intravascularly depleted, though he has evidence of third spacing. -Continue IV fluids, antibiotics. -May need to start patient on levophed to keep BP up for dialysis.   RENAL A:  CKD P:   Continue HD per nephro. May need pressors for dialysis.  GASTROINTESTINAL A:  C difficile colitis P:   Continue IVF, abx.   HEMATOLOGIC A:  Leukocytosis, likely due to see DLCO with sepsis. P:  Continue to monitor.  INFECTIOUS A:  Leukocytosis with increasing white count, due to C. difficile with sepsis. P:   -Continue Flagyl and oral vancomycin for C. difficile sepsis. -Check pro calcitonin and monitor. -Recheck blood cultures. Given persistent hypotension.  CULTURES: 6/26 Blood culture>> negative as of 7/1 6/26 c-diff> Positive 6/27: MRSA screening PCR positive  Pro-calcitonin 2.3>>2.42 07/15/14  ANTIBIOTICS vancomycin 6/26 >> 6/27 Azithromycin 6/26 >> 6/27 cefepime 6/26 >> 6/28 metronidazole 6/27 >> Vancomycin PO 6/27 >>    ENDOCRINE A:  Hyperglycemia.  P:   Will increase Levemir to 20 units daily at bedtime, and continue NovoLog correction  scale.  NEUROLOGIC A:  -- --   MAJOR EVENTS/TEST RESULTS:   Best Practices  DVT Prophylaxis: scd GI Prophylaxis: --   ---------------------------------------   ----------------------------------------   Name: John Robinson MRN: [id number] DOB: [birth year]    ADMISSION DATE:  06/20/2015     SUBJECTIVE:   Patient currently awake, alert, appears ill and fatigued.  Review of Systems:  Constitutional: Feels well. Cardiovascular: No chest pain.  Pulmonary: Denies dyspnea.   The remainder of systems were reviewed and were found to be negative other than what is documented in the HPI.    VITAL SIGNS: Temp:  [97 F (36.1 C)-97.8 F (36.6 C)] 97.7 F (36.5 C) (07/01 0400) Pulse Rate:  [55-128] 88 (07/01 1000) Resp:  [12-23] 15 (07/01 1000) BP: (66-110)/(36-87) 80/58 mmHg (07/01 0800) SpO2:  [77 %-100 %] 100 % (07/01 1000) HEMODYNAMICS:   VENTILATOR SETTINGS:   INTAKE / OUTPUT:  Intake/Output Summary (Last 24 hours) at 07/15/15 1054 Last data filed at 07/14/15 2200  Gross per 24 hour  Intake    237 ml  Output      0 ml  Net    237 ml    PHYSICAL EXAMINATION: Physical Examination:   VS: BP 80/58 mmHg  Pulse 88  Temp(Src) 97.7 F (36.5 C) (Axillary)  Resp 15  Ht 6' (1.829 m)  Wt 157 lb 6.5 oz (71.4 kg)  BMI 21.34 kg/m2  SpO2 100%  General Appearance: No distress  Neuro:without focal findings, mental status, Slow, but responsive HEENT: PERRLA, EOM intact. Pulmonary: Bilateral expiratory rhonchi. CardiovascularNormal S1,S2.  No m/r/g.   Abdomen: Benign, Soft, non-tender. Renal:  No costovertebral tenderness  GU:  Not performed at this time.  Endocrine: No evident thyromegaly. Skin:   warm, no rashes, no ecchymosis  Extremities: normal, no cyanosis, clubbing.   LABS:   LABORATORY PANEL:   CBC  Recent Labs Lab 07/15/15 0628  WBC 44.8*  HGB 12.8*  HCT 41.3  PLT 151    Chemistries   Recent Labs Lab 06/19/2015 1240   07/15/15 0628  NA 137  < > 138  K 4.1  < > 5.5*  CL 103  < > 103  CO2 21*  < > 25  GLUCOSE 214*  < > 304*  BUN 101*  < > 66*  CREATININE 6.88*  < > 5.48*  CALCIUM 8.5*  < > 7.8*  MG  --   < > 2.3  PHOS  --   < > 7.1*  AST 17  --   --   ALT 17  --   --   ALKPHOS 91  --   --   BILITOT 0.5  --   --   < > = values in this interval not displayed.   Recent Labs Lab 07/13/15 2113 07/14/15 0710 07/14/15 1131 07/14/15 1644 07/14/15 2131 07/15/15 0708  GLUCAP 264* 314* 285* 191* 217* 301*    Recent Labs Lab 07/14/15 1800  PHART 7.32*  PCO2ART 48    Recent Labs Lab 07/08/2015 1240  AST 17  ALT 17  ALKPHOS 91  BILITOT 0.5  ALBUMIN 2.5*    Cardiac Enzymes  Recent Labs Lab 07/07/2015 1240  TROPONINI 0.11*    RADIOLOGY:  Dg Chest 1 View  07/14/2015  CLINICAL DATA:  Shortness of breath this evening. EXAM: CHEST 1 VIEW COMPARISON:  07/13/2015 and 07/14/2015 FINDINGS: Sternotomy wires unchanged. Left IJ dialysis catheter and right IJ central venous catheter unchanged. Lungs are hypoinflated with stable left base opacification likely small effusion and associated atelectasis. Mild opacification over the medial right base unchanged which may also be due to small amount of pleural fluid and atelectasis. Cannot exclude infection in the lung bases. Mild stable cardiomegaly. Calcified plaque over the aortic arch stable stent in the left axillary region. IMPRESSION: Stable medial right base opacification and stable left base opacification as findings may be due to small amount of bilateral pleural fluid and associated atelectasis. Cannot exclude infection in the lung bases. Stable mild cardiomegaly. Aortic atherosclerosis. Tubes and lines as described. Electronically Signed   By: Marin Olp M.D.   On: 07/14/2015 20:44   Dg Chest Port 1 View  07/15/2015  CLINICAL DATA:  Dyspnea EXAM: PORTABLE CHEST 1 VIEW COMPARISON:  07/14/2015 FINDINGS: Cardiac shadow is stable. A dialysis catheter  and right jugular catheter are again seen and stable. Postsurgical changes are again noted. Questionable pleural line is noted laterally on the right. This may be related to an underlying skin fold. Bibasilar atelectatic changes are noted with a small left effusion. No other focal abnormality is noted. Left vascular stenting is identified. IMPRESSION: Stable bibasilar changes. Questionable pleural line laterally on the right. This was not seen on the previous exam and is likely related to a skin fold. Continued follow-up is recommended. These results will be called to the ordering clinician or representative by the Radiologist Assistant, and communication documented in the PACS or zVision Dashboard. Electronically Signed   By: Inez Catalina M.D.   On: 07/15/2015 07:30   Dg Chest Port 1 View  07/14/2015  CLINICAL DATA:  Central line placement EXAM: PORTABLE CHEST 1 VIEW COMPARISON:  07/14/2015 FINDINGS: Cardiomegaly again noted. Left IJ catheter  with tip in right atrium is unchanged in position. There is right IJ central line with tip in distal SVC. No pneumothorax. Persistent small left pleural effusion with left basilar atelectasis or infiltrate. IMPRESSION: Left IJ catheter with tip in right atrium is unchanged in position. There is right IJ central line with tip in distal SVC. No pneumothorax. Persistent small left pleural effusion with left basilar atelectasis or infiltrate. Electronically Signed   By: Lahoma Crocker M.D.   On: 07/14/2015 14:43   Dg Chest Port 1 View  07/14/2015  CLINICAL DATA:  Shortness of breath.  Productive cough. EXAM: PORTABLE CHEST 1 VIEW COMPARISON:  07/13/2015 FINDINGS: Cardiomegaly. Prior CABG. Left dialysis catheter remains in place, unchanged. Left lower lobe atelectasis or infiltrate. Increasing right medial basilar airspace opacity as well. Possible small left effusion. No acute bony abnormality. IMPRESSION: Continued left lower lobe atelectasis or infiltrate. Suspect small left  effusion. Increasing right medial basilar airspace opacity. Electronically Signed   By: Rolm Baptise M.D.   On: 07/14/2015 12:23       --Marda Stalker, MD.  ICU Pager: [phone] [place] [facility] Office Number: [phone]  Patricia Pesa, M.D.  Vilinda Boehringer, M.D.  Merton Border, M.D  07/15/2015   Critical Care Attestation.  I have personally obtained a history, examined the patient, evaluated laboratory and imaging results, formulated the assessment and plan and placed orders. The Patient requires high complexity decision making for assessment and support, frequent evaluation and titration of therapies, application of advanced monitoring technologies and extensive interpretation of multiple databases. The patient has critical illness that could lead imminently to failure of 1 or more organ systems and requires the highest level of physician preparedness to intervene.  Critical Care Time devoted to patient care services described in this note is 40 minutes and is exclusive of time spent in procedures.

## 2015-07-15 NOTE — Evaluation (Signed)
Physical Therapy Evaluation Patient Details Name: John Robinson MRN: [id number] DOB: [birth year] Today's Date: 07/15/2015   History of Present Illness  82 y/o male who comes to [facility] with shortness of breath and general malaise.  He was found to be septic 2/2 PNA , c.diff.  Pt has had hypotension during this stay, feels very poorly during PT exam.  Clinical Impression  Pt does agree to do some minimal work with PT but is clearly not feeling well and generally needs a lot of encouragement to participate with LE activities.  His O2 remains in the high 90s on room air t/o the session, but he c/o severe fatigue and is unable to, in reality, do a lot secondary to fatigue and generally feeling poorly.    Follow Up Recommendations SNF    Equipment Recommendations       Recommendations for Other Services       Precautions / Restrictions Precautions Precautions: Fall Restrictions Weight Bearing Restrictions: No      Mobility  Bed Mobility               General bed mobility comments: deferred, pt's BP remains low and regardless pt refuses to consider trying  Transfers                    Ambulation/Gait                Stairs            Wheelchair Mobility    Modified Rankin (Stroke Patients Only)       Balance                                             Pertinent Vitals/Pain      Home Living Family/patient expects to be discharged to:: Skilled nursing facility Living Arrangements: Spouse/significant other                    Prior Function           Comments: prior to recent rehab pt was independent with all acts     Hand Dominance        Extremity/Trunk Assessment   Upper Extremity Assessment: Generalized weakness (R shoulder grossly 3+/5 elevation <90, L UE grossly 4/5)           Lower Extremity Assessment: Generalized weakness (grossly 4/5)         Communication   Communication:  (pt very  short of breath and labored with speaking)  Cognition Arousal/Alertness: Awake/alert Behavior During Therapy: Anxious;Restless Overall Cognitive Status: Within Functional Limits for tasks assessed                      General Comments      Exercises General Exercises - Lower Extremity Ankle Circles/Pumps: AROM;10 reps Heel Slides: Strengthening;10 reps Hip ABduction/ADduction: Strengthening;10 reps      Assessment/Plan    PT Assessment Patient needs continued PT services  PT Diagnosis Generalized weakness   PT Problem List Decreased strength;Decreased activity tolerance;Decreased range of motion;Decreased balance;Decreased mobility;Decreased knowledge of use of DME;Decreased safety awareness  PT Treatment Interventions Therapeutic activities;Therapeutic exercise;Balance training;DME instruction;Gait training;Functional mobility training;Neuromuscular re-education   PT Goals (Current goals can be found in the Care Plan section) Acute Rehab PT Goals Patient Stated Goal: none stated PT Goal Formulation: With patient/family Time  For Goal Achievement: 07/29/15 Potential to Achieve Goals: Fair    Frequency Min 2X/week   Barriers to discharge        Co-evaluation               End of Session   Activity Tolerance: Patient limited by fatigue Patient left: with nursing/sitter in room;in bed;with call bell/phone within reach           Time: 1310-1325 PT Time Calculation (min) (ACUTE ONLY): 15 min   Charges:   PT Evaluation $PT Eval Low Complexity: 1 Procedure     PT G CodesKreg Shropshire, DPT 07/15/2015, 2:56 PM

## 2015-07-15 DEATH — deceased

## 2015-07-16 ENCOUNTER — Inpatient Hospital Stay: Payer: Commercial Managed Care - HMO

## 2015-07-16 ENCOUNTER — Encounter: Payer: Self-pay | Admitting: Adult Health

## 2015-07-16 DIAGNOSIS — R627 Adult failure to thrive: Secondary | ICD-10-CM

## 2015-07-16 LAB — TROPONIN I
TROPONIN I: 0.11 ng/mL — AB (ref <0.03)
TROPONIN I: 0.11 ng/mL — AB (ref <0.03)

## 2015-07-16 LAB — BASIC METABOLIC PANEL
ANION GAP: 13 (ref 5–15)
BUN: 78 mg/dL — ABNORMAL HIGH (ref 6–20)
CHLORIDE: 102 mmol/L (ref 101–111)
CO2: 23 mmol/L (ref 22–32)
CREATININE: 6.22 mg/dL — AB (ref 0.61–1.24)
Calcium: 7.8 mg/dL — ABNORMAL LOW (ref 8.9–10.3)
GFR calc non Af Amer: 8 mL/min — ABNORMAL LOW (ref >60)
GFR, EST AFRICAN AMERICAN: 9 mL/min — AB (ref >60)
Glucose, Bld: 182 mg/dL — ABNORMAL HIGH (ref 65–99)
POTASSIUM: 4.9 mmol/L (ref 3.5–5.1)
SODIUM: 138 mmol/L (ref 135–145)

## 2015-07-16 LAB — CBC
HCT: 39.8 % — ABNORMAL LOW (ref 40.0–52.0)
HEMOGLOBIN: 13 g/dL (ref 13.0–18.0)
MCH: 28.3 pg (ref 26.0–34.0)
MCHC: 32.5 g/dL (ref 32.0–36.0)
MCV: 87 fL (ref 80.0–100.0)
Platelets: 112 10*3/uL — ABNORMAL LOW (ref 150–440)
RBC: 4.57 MIL/uL (ref 4.40–5.90)
RDW: 21.4 % — ABNORMAL HIGH (ref 11.5–14.5)
WBC: 44.2 10*3/uL — AB (ref 3.8–10.6)

## 2015-07-16 LAB — GLUCOSE, CAPILLARY
GLUCOSE-CAPILLARY: 175 mg/dL — AB (ref 65–99)
GLUCOSE-CAPILLARY: 117 mg/dL — AB (ref 65–99)
GLUCOSE-CAPILLARY: 160 mg/dL — AB (ref 65–99)
Glucose-Capillary: 167 mg/dL — ABNORMAL HIGH (ref 65–99)

## 2015-07-16 LAB — LIPASE, BLOOD: Lipase: 21 U/L (ref 11–51)

## 2015-07-16 LAB — PROTIME-INR
INR: 3.29
Prothrombin Time: 32.8 seconds — ABNORMAL HIGH (ref 11.4–15.0)

## 2015-07-16 LAB — PROCALCITONIN: PROCALCITONIN: 2.06 ng/mL

## 2015-07-16 LAB — AMYLASE: AMYLASE: 52 U/L (ref 28–100)

## 2015-07-16 MED ORDER — ALUM & MAG HYDROXIDE-SIMETH 200-200-20 MG/5ML PO SUSP
30.0000 mL | ORAL | Status: DC | PRN
  Administered 2015-07-16 (×2): 30 mL via ORAL
  Filled 2015-07-16 (×2): qty 30

## 2015-07-16 MED ORDER — FAMOTIDINE IN NACL 20-0.9 MG/50ML-% IV SOLN
20.0000 mg | INTRAVENOUS | Status: DC
  Administered 2015-07-16 – 2015-07-19 (×4): 20 mg via INTRAVENOUS
  Filled 2015-07-16 (×4): qty 50

## 2015-07-16 MED ORDER — FAMOTIDINE IN NACL 20-0.9 MG/50ML-% IV SOLN
20.0000 mg | Freq: Two times a day (BID) | INTRAVENOUS | Status: DC
  Filled 2015-07-16: qty 50

## 2015-07-16 MED ORDER — DIATRIZOATE MEGLUMINE & SODIUM 66-10 % PO SOLN
15.0000 mL | ORAL | Status: AC
  Administered 2015-07-16: 15 mL via ORAL

## 2015-07-16 MED ORDER — GI COCKTAIL ~~LOC~~
30.0000 mL | Freq: Three times a day (TID) | ORAL | Status: DC | PRN
  Administered 2015-07-16 – 2015-07-17 (×2): 30 mL via ORAL
  Filled 2015-07-16 (×3): qty 30

## 2015-07-16 MED ORDER — DOPAMINE-DEXTROSE 3.2-5 MG/ML-% IV SOLN
2.5000 ug/kg/min | INTRAVENOUS | Status: DC
  Administered 2015-07-16: 2.5 ug/kg/min via INTRAVENOUS

## 2015-07-16 MED ORDER — IOPAMIDOL (ISOVUE-300) INJECTION 61%
100.0000 mL | Freq: Once | INTRAVENOUS | Status: AC | PRN
  Administered 2015-07-16: 100 mL via INTRAVENOUS

## 2015-07-16 MED ORDER — SODIUM CHLORIDE 0.45 % IV SOLN
INTRAVENOUS | Status: DC
  Administered 2015-07-16: 11:00:00 via INTRAVENOUS

## 2015-07-16 MED ORDER — METOCLOPRAMIDE HCL 5 MG/5ML PO SOLN
5.0000 mg | Freq: Four times a day (QID) | ORAL | Status: DC | PRN
  Filled 2015-07-16 (×2): qty 5

## 2015-07-16 MED ORDER — METOCLOPRAMIDE HCL 5 MG/ML IJ SOLN
5.0000 mg | INTRAMUSCULAR | Status: DC | PRN
  Administered 2015-07-16 – 2015-07-17 (×3): 5 mg via INTRAVENOUS
  Filled 2015-07-16 (×3): qty 2

## 2015-07-16 NOTE — Progress Notes (Signed)
[facility] Progess Note    ASSESSMENT/PLAN   82 year old male severe debility, ESRD and deconditioning, status post long hospital admission for CABG, complicated by sternal wound infection. Went home one week ago after 3 months of being in medical facilities, returned with C. difficile with sepsis.  PULMONARY A:Acute hypoxic respiratory failure Severe, COPD/emphysema. Left lower lobe infiltrate and perihilar congestion-No significant change from Chest x-ray images from 6/30.  P:   -Continue to monitor respiratory status closely. -Supplemental O2 [place] prn   CARDIOVASCULAR A:  Hypotension, likely due to septic shock from C. difficile colitis-improved with IV fluids and dopamine ?New onset Afib on telemetry P:  -STAT EKG -Hemodynamics per ICU -Continue IV fluids and dopamine   RENAL A:   ESRD on hemodialysis P:   -Continue HD per nephro.  -May need pressors for dialysis.  GASTROINTESTINAL A:   C difficile colitis Nausea and vomiting-likely 2/2 medication side effects or acute exacerbation of GERD, abdominal x-ray  negative  H/O GERD P:   -Continue PO vancomycin and iv flagyl -Zofran and Reglan prn -Pepcid 20 mg iv bid  HEMATOLOGIC A:  Leukocytosis, likely due to C-diff colitis with sepsis. P:  Continue to monitor.  INFECTIOUS A:   Leukocytosis with increasing white count, due to C. difficile with sepsis and steroids-procalcitonin >2. P:   -Continue Flagyl and oral vancomycin for C. difficile sepsis. -Check pro calcitonin and monitor for fever. -f/u cultures  CULTURES: 6/26 Blood culture>> negative as of 7/1 6/26 c-diff> Positive 6/27: MRSA screening PCR positive  Pro-calcitonin 2.3>>2.42 07/15/14>2.03 7/2  ANTIBIOTICS vancomycin 6/26 >> 6/27 Azithromycin 6/26 >> 6/27 cefepime 6/26 >> 6/28 metronidazole 6/27 >> Vancomycin PO 6/27 >>    ENDOCRINE A:  Hyperglycemia-steroid induced  P:   Continue Levemir to 20 units daily at  bedtime, and continue NovoLog correction scale.   MAJOR EVENTS/TEST RESULTS:   Best Practices  DVT Prophylaxis: scd GI Prophylaxis: --   ---------------------------------------   ----------------------------------------   Name: John Robinson MRN: [id number] DOB: 1932-01-21    ADMISSION DATE:  07/01/2015     SUBJECTIVE:   Patient currently awake, alert, appears ill and fatigued.  Review of Systems:  Constitutional: Feels well. Cardiovascular: No chest pain.  Pulmonary: Denies dyspnea.   GI: Reports mild abdominal pain, nausea and vomiting The remainder of systems were reviewed and were found to be negative other than what is documented in the HPI.    VITAL SIGNS: Temp:  [97.3 F (36.3 C)-98.1 F (36.7 C)] 98.1 F (36.7 C) (07/02 0300) Pulse Rate:  [88-109] 107 (07/02 0700) Resp:  [13-25] 19 (07/02 0700) BP: (68-146)/(34-129) 109/76 mmHg (07/02 0700) SpO2:  [93 %-100 %] 100 % (07/02 0700) HEMODYNAMICS:   VENTILATOR SETTINGS:   INTAKE / OUTPUT:  Intake/Output Summary (Last 24 hours) at 07/16/15 0710 Last data filed at 07/16/15 0400  Gross per 24 hour  Intake 1483.97 ml  Output      0 ml  Net 1483.97 ml    PHYSICAL EXAMINATION: Physical Examination:   VS: BP 109/76 mmHg  Pulse 107  Temp(Src) 98.1 F (36.7 C) (Axillary)  Resp 19  Ht 6' (1.829 m)  Wt 157 lb 6.5 oz (71.4 kg)  BMI 21.34 kg/m2  SpO2 100%  General Appearance: No distress  Neuro:AAO X3, no focal deficits HEENT: PERRLA, EOM intact. Pulmonary: Bilateral airflow, improved rhonchi bilaterally Cardiovascular: NSR, S1,S2.  No m/r/g.   Abdomen:Non-distended, normal bowel sounds Renal:  No costovertebral tenderness  Endocrine: No  evident thyromegaly. Skin:   warm, no rashes, no ecchymosis  Extremities: Venous stasis discoloration bilaterally  LABS:   LABORATORY PANEL:   CBC  Recent Labs Lab 07/16/15 0500  WBC 44.2*  HGB 13.0  HCT 39.8*  PLT 112*    Chemistries    Recent Labs Lab 07/14/2015 1240  07/15/15 0628 07/16/15 0500  NA 137  < > 138 138  K 4.1  < > 5.5* 4.9  CL 103  < > 103 102  CO2 21*  < > 25 23  GLUCOSE 214*  < > 304* 182*  BUN 101*  < > 66* 78*  CREATININE 6.88*  < > 5.48* 6.22*  CALCIUM 8.5*  < > 7.8* 7.8*  MG  --   < > 2.3  --   PHOS  --   < > 7.1*  --   AST 17  --   --   --   ALT 17  --   --   --   ALKPHOS 91  --   --   --   BILITOT 0.5  --   --   --   < > = values in this interval not displayed.   Recent Labs Lab 07/14/15 2131 07/15/15 0708 07/15/15 1153 07/15/15 1550 07/15/15 2134 07/16/15 0659  GLUCAP 217* 301* 246* 129* 158* 175*    Recent Labs Lab 07/14/15 1800  PHART 7.32*  PCO2ART 48    Recent Labs Lab 06/23/2015 1240  AST 17  ALT 17  ALKPHOS 91  BILITOT 0.5  ALBUMIN 2.5*    Cardiac Enzymes  Recent Labs Lab 07/08/2015 1240  TROPONINI 0.11*    RADIOLOGY:  Dg Chest 1 View  07/16/2015  CLINICAL DATA:  Dyspnea. EXAM: CHEST 1 VIEW COMPARISON:  07/15/2015 FINDINGS: Dense consolidation in the left base. Patchy opacities in the central lung regions. Bilateral knee jugular venous catheters appear satisfactorily positioned, extending into the right atrium. Pulmonary vasculature is normal. IMPRESSION: Unchanged left base consolidation and mild central perihilar airspace opacities. This could represent infectious infiltrate. Electronically Signed   By: Andreas Newport M.D.   On: 07/16/2015 05:34   Dg Chest 1 View  07/14/2015  CLINICAL DATA:  Shortness of breath this evening. EXAM: CHEST 1 VIEW COMPARISON:  07/13/2015 and 07/14/2015 FINDINGS: Sternotomy wires unchanged. Left IJ dialysis catheter and right IJ central venous catheter unchanged. Lungs are hypoinflated with stable left base opacification likely small effusion and associated atelectasis. Mild opacification over the medial right base unchanged which may also be due to small amount of pleural fluid and atelectasis. Cannot exclude infection in the  lung bases. Mild stable cardiomegaly. Calcified plaque over the aortic arch stable stent in the left axillary region. IMPRESSION: Stable medial right base opacification and stable left base opacification as findings may be due to small amount of bilateral pleural fluid and associated atelectasis. Cannot exclude infection in the lung bases. Stable mild cardiomegaly. Aortic atherosclerosis. Tubes and lines as described. Electronically Signed   By: Marin Olp M.D.   On: 07/14/2015 20:44   Dg Abd 1 View  07/16/2015  CLINICAL DATA:  Vomiting. EXAM: ABDOMEN - 1 VIEW COMPARISON:  None FINDINGS: Extensive endoluminal stent graft in the aorta and left common iliac regions. Right external iliac stent. The abdominal gas pattern is negative for obstruction or perforation. No biliary or urinary calculi are evident. IMPRESSION: Negative for obstruction or perforation. Electronically Signed   By: Andreas Newport M.D.   On: 07/16/2015 05:32  Dg Chest Port 1 View  07/15/2015  CLINICAL DATA:  Dyspnea EXAM: PORTABLE CHEST 1 VIEW COMPARISON:  07/14/2015 FINDINGS: Cardiac shadow is stable. A dialysis catheter and right jugular catheter are again seen and stable. Postsurgical changes are again noted. Questionable pleural line is noted laterally on the right. This may be related to an underlying skin fold. Bibasilar atelectatic changes are noted with a small left effusion. No other focal abnormality is noted. Left vascular stenting is identified. IMPRESSION: Stable bibasilar changes. Questionable pleural line laterally on the right. This was not seen on the previous exam and is likely related to a skin fold. Continued follow-up is recommended. These results will be called to the ordering clinician or representative by the Radiologist Assistant, and communication documented in the PACS or zVision Dashboard. Electronically Signed   By: Inez Catalina M.D.   On: 07/15/2015 07:30   Dg Chest Port 1 View  07/14/2015  CLINICAL DATA:   Central line placement EXAM: PORTABLE CHEST 1 VIEW COMPARISON:  07/14/2015 FINDINGS: Cardiomegaly again noted. Left IJ catheter with tip in right atrium is unchanged in position. There is right IJ central line with tip in distal SVC. No pneumothorax. Persistent small left pleural effusion with left basilar atelectasis or infiltrate. IMPRESSION: Left IJ catheter with tip in right atrium is unchanged in position. There is right IJ central line with tip in distal SVC. No pneumothorax. Persistent small left pleural effusion with left basilar atelectasis or infiltrate. Electronically Signed   By: Lahoma Crocker M.D.   On: 07/14/2015 14:43   Dg Chest Port 1 View  07/14/2015  CLINICAL DATA:  Shortness of breath.  Productive cough. EXAM: PORTABLE CHEST 1 VIEW COMPARISON:  07/13/2015 FINDINGS: Cardiomegaly. Prior CABG. Left dialysis catheter remains in place, unchanged. Left lower lobe atelectasis or infiltrate. Increasing right medial basilar airspace opacity as well. Possible small left effusion. No acute bony abnormality. IMPRESSION: Continued left lower lobe atelectasis or infiltrate. Suspect small left effusion. Increasing right medial basilar airspace opacity. Electronically Signed   By: Rolm Baptise M.D.   On: 07/14/2015 12:23    [facility] ANP-BC Pulmonary and Critical Care Medicine [facility] Pager [phone] or [phone]   07/16/2015   Patient seen with NP, agree with assessment and plan. Acute sepsis from C. difficile colitis, lungs are clear to auscultation bilaterally with decreased air entry. Patient complains of abdominal pain and nausea, will start IV famotidine and Zofran. Marda Stalker, M.D.  07/16/2015   Critical Care Attestation.  I have personally obtained a history, examined the patient, evaluated laboratory and imaging results, formulated the assessment and plan and placed orders. The Patient requires high complexity decision making for assessment and support,  frequent evaluation and titration of therapies, application of advanced monitoring technologies and extensive interpretation of multiple databases. The patient has critical illness that could lead imminently to failure of 1 or more organ systems and requires the highest level of physician preparedness to intervene.  Critical Care Time devoted to patient care services described in this note is 35 minutes and is exclusive of time spent in procedures.

## 2015-07-16 NOTE — Consult Note (Signed)
Pharmacy Antibiotic Note  John Robinson is a 82 y.o. male admitted on 06/15/2015 with pneumonia. MD notes dysphagia and concern for aspiration PNA. Pharmacy has been consulted for C diff management. Hemodialysis patient.   Plan: Will continue current orders for Metronidazole 500mg  IV Q8hr and Vancomycin 500 mg PO q6h.   Height: 6' (182.9 cm) Weight: 157 lb 6.5 oz (71.4 kg) IBW/kg (Calculated) : 77.6  Temp (24hrs), Avg:97.7 F (36.5 C), Min:97.3 F (36.3 C), Max:98.1 F (36.7 C)   Recent Labs Lab 07/02/2015 1530 07/05/2015 1924  07/12/15 0603 07/13/15 0625 07/14/15 0528 07/14/15 1730 07/15/15 0628 07/16/15 0500  WBC  --   --   < > 36.1* 44.2* 47.2*  --  44.8* 44.2*  CREATININE  --   --   < > 4.61* 6.04* 4.21* 4.84* 5.48* 6.22*  LATICACIDVEN 1.7 1.6  --   --   --   --  2.2*  --   --   < > = values in this interval not displayed.  Estimated Creatinine Clearance: 9.4 mL/min (by C-G formula based on Cr of 6.22).    Allergies  Allergen Reactions  . Pletal [Cilostazol] Other (See Comments)    Reaction:  Dizziness    Antimicrobials this admission: vancomycin 6/26 >> 6/27 Azithromycin 6/26 >> 6/27 cefepime 6/26 >> 6/28 Metronidazole 6/27 >> Vancomycin PO 6/27 >>   Dose adjustments this admission:  Microbiology results: 6/26 BCx: NGTD 6/26 cdiff: positive  6/27 Sputum Cx: pending  6/27 MRSA PCR: positive  Pharmacy will continue to monitor and adjust per consult.    Chinita Greenland PharmD Clinical Pharmacist 07/16/2015 12:35 PM

## 2015-07-16 NOTE — Progress Notes (Signed)
ANTICOAGULATION CONSULT NOTE - Follow up [place] for Warfarin  Indication: VTE prophylaxis  Allergies  Allergen Reactions  . Pletal [Cilostazol] Other (See Comments)    Reaction:  Dizziness    Patient Measurements: Height: 6' (182.9 cm) Weight: 157 lb 6.5 oz (71.4 kg) IBW/kg (Calculated) : 77.6  Vital Signs: Temp: 97.7 F (36.5 C) (07/02 0800) Temp Source: Axillary (07/02 0800) BP: 100/70 mmHg (07/02 0800) Pulse Rate: 104 (07/02 0800)  Labs:  Recent Labs  07/14/15 0528 07/14/15 1730 07/15/15 0628 07/16/15 0500  HGB 14.2 12.5* 12.8* 13.0  HCT 44.4 39.7* 41.3 39.8*  PLT 194  --  151 112*  LABPROT 38.7*  --  34.0* 32.8*  INR 4.06*  --  3.45 3.29  CREATININE 4.21* 4.84* 5.48* 6.22*    Estimated Creatinine Clearance: 9.4 mL/min (by C-G formula based on Cr of 6.22).  Assessment: Pharmacy consulted to dose warfarin in this 82 year old male admitted with PNA and Cdiff. Currently also on metronidazole and oral vancomycin.  Pt was on warfarin 5 mg PO QHS at home.  INR on 6/26 was 4.25.   6/28 INR  2.95  Warfarin 2 mg 6/29 INR  3.78  none 6/30 INR  4.06  none 7/1   INR  3.45  none 7/2   INR  3.29  Goal of Therapy:  INR 2-3   Plan:  6/30- Patient received vitamin K 5mg  po due to needing line placement. Will obtain follow-up INR with am labs.    7/1- Will continue to hold Warfarin at this time. F/u INR w/ am labs. (on Metronidazole/Vanc po).  7/2- INR 3.29. continue to hold Warfarin. Patient on Metronidazole/Vancomycin. F/u INR in am.   Wendi Lastra A 07/16/2015,9:25 AM

## 2015-07-16 NOTE — Progress Notes (Signed)
Pt refused midnight dose of Vancomycin and was coerced to take it. However pt experienced 2 episodes of emesis this am. Called pharmacy to get am dose of Vancomycin rescheduled so pt wouldn't refuse due to nausea.

## 2015-07-16 NOTE — Progress Notes (Signed)
Pt experienced two episodes of vomiting after Zofran administration. Reglan given per NP Tukov. Pt sleeping at this time.

## 2015-07-16 NOTE — Progress Notes (Addendum)
Pt with n/v and ABD pain in AM, subsided in afternoon.  CT demonstrates thickening probably related to C Diff,  Pt reported feeling "pretty good" this afternoon.  Tolerated oral dose vanc, no emesis.  Could not tolerate oral contrast this AM,  CT was done with IV contrast.  Turned this shift as he could tolerate.  Dopamine reduced to 2.5.  BP tolerated after brief interval of low pressures.  HR improved, pt is currently Afib

## 2015-07-16 NOTE — Progress Notes (Signed)
Subjective:  Patient admitted for coughing spells and shortness of breath. Also reported sore throat, greenish sputum, cold chills Patient underwent CABG at [facility], postop course complicated by sternal wound infection, requires rehabilitation for 6 weeks.  At home for about one week Tested positive for C. Difficile infection Having significant amount of loose stools Appetite is poor BP is low, requiring fluid boluses Continues to feel poorly today Dialysis was held yesterday due to low BP     Objective:  Vital signs in last 24 hours:  Temp:  [97.3 F (36.3 C)-98.1 F (36.7 C)] 97.7 F (36.5 C) (07/02 0800) Pulse Rate:  [88-109] 104 (07/02 0800) Resp:  [13-26] 26 (07/02 0800) BP: (68-146)/(34-129) 100/70 mmHg (07/02 0800) SpO2:  [93 %-100 %] 99 % (07/02 0800)  Weight change:  Filed Weights   07/11/15 1515 07/11/15 1850 07/13/15 1045  Weight: 72.8 kg (160 lb 7.9 oz) 72.3 kg (159 lb 6.3 oz) 71.4 kg (157 lb 6.5 oz)    Intake/Output:    Intake/Output Summary (Last 24 hours) at 07/16/15 0907 Last data filed at 07/16/15 0400  Gross per 24 hour  Intake 1483.97 ml  Output      0 ml  Net 1483.97 ml     Physical Exam: General: Appears restless, laying in the bed  HEENT anicteric  Neck supple  Pulm/lungs B/l mild crackles, normal respiratory effort,    CVS/Heart irregular, soft systolic murmur  Abdomen:  Soft, nontender  Extremities: No peripheral edema  Neurologic: Alert, oriented  Skin: bruises over arms  Access: Left IJ PermCath       Basic Metabolic Panel:   Recent Labs Lab 07/13/15 0625 07/14/15 0528 07/14/15 1730 07/15/15 0628 07/16/15 0500  NA 138 139 139 138 138  K 5.4* 4.8 4.5 5.5* 4.9  CL 102 103 104 103 102  CO2 22 26 25 25 23   GLUCOSE 394* 282* 209* 304* 182*  BUN 73* 45* 54* 66* 78*  CREATININE 6.04* 4.21* 4.84* 5.48* 6.22*  CALCIUM 7.7* 7.8* 7.6* 7.8* 7.8*  MG  --   --  2.1 2.3  --   PHOS  --   --  6.3* 7.1*  --      CBC:  Recent  Labs Lab 06/22/2015 1240  07/12/15 0603 07/13/15 0625 07/14/15 0528 07/14/15 1730 07/15/15 0628 07/16/15 0500  WBC 24.7*  < > 36.1* 44.2* 47.2*  --  44.8* 44.2*  NEUTROABS 21.2*  --   --   --   --   --   --   --   HGB 11.8*  < > 12.4* 14.0 14.2 12.5* 12.8* 13.0  HCT 35.3*  < > 39.3* 43.9 44.4 39.7* 41.3 39.8*  MCV 85.5  < > 87.7 88.1 87.1  --  89.7 87.0  PLT 269  < > 329 303 194  --  151 112*  < > = values in this interval not displayed.    Microbiology:  Recent Results (from the past 720 hour(s))  C difficile quick scan w PCR reflex     Status: Abnormal   Collection Time: 06/23/2015  1:45 AM  Result Value Ref Range Status   C Diff antigen POSITIVE (A) NEGATIVE Final   C Diff toxin POSITIVE (A) NEGATIVE Final   C Diff interpretation   Final    Positive for toxigenic C. difficile, active toxin production present.    Comment: CRITICAL RESULT CALLED TO, READ BACK BY AND VERIFIED WITH:  JACK DOUGHERTY AT [id number] 07/11/15 SDR  Blood culture (routine x 2)     Status: None   Collection Time: 06/23/2015  2:10 PM  Result Value Ref Range Status   Specimen Description BLOOD RIGHT ASSIST CONTROL  Final   Special Requests BOTTLES DRAWN AEROBIC AND ANAEROBIC [place]  Final   Culture NO GROWTH 5 DAYS  Final   Report Status 07/15/2015 FINAL  Final  Blood culture (routine x 2)     Status: None   Collection Time: 07/04/2015  2:10 PM  Result Value Ref Range Status   Specimen Description BLOOD RIGHT HAND  Final   Special Requests   Final    BOTTLES DRAWN AEROBIC AND ANAEROBIC [place]   Culture NO GROWTH 5 DAYS  Final   Report Status 07/15/2015 FINAL  Final  MRSA PCR Screening     Status: Abnormal   Collection Time: 07/11/15  7:06 PM  Result Value Ref Range Status   MRSA by PCR POSITIVE (A) NEGATIVE Final    Comment:        The GeneXpert MRSA Assay (FDA approved for NASAL specimens only), is one component of a comprehensive MRSA colonization surveillance program. It is  not intended to diagnose MRSA infection nor to guide or monitor treatment for MRSA infections. READ BACK AND VERIFIED BY MARCELLA TURNER AT 2120 07/11/2015.  TFK     Coagulation Studies:  Recent Labs  07/14/15 0528 07/15/15 0628 07/16/15 0500  LABPROT 38.7* 34.0* 32.8*  INR 4.06* 3.45 3.29    Urinalysis: No results for input(s): COLORURINE, LABSPEC, PHURINE, GLUCOSEU, HGBUR, BILIRUBINUR, KETONESUR, PROTEINUR, UROBILINOGEN, NITRITE, LEUKOCYTESUR in the last 72 hours.  Invalid input(s): APPERANCEUR    Imaging: Dg Chest 1 View  07/16/2015  CLINICAL DATA:  Dyspnea. EXAM: CHEST 1 VIEW COMPARISON:  07/15/2015 FINDINGS: Dense consolidation in the left base. Patchy opacities in the central lung regions. Bilateral knee jugular venous catheters appear satisfactorily positioned, extending into the right atrium. Pulmonary vasculature is normal. IMPRESSION: Unchanged left base consolidation and mild central perihilar airspace opacities. This could represent infectious infiltrate. Electronically Signed   By: Andreas Newport M.D.   On: 07/16/2015 05:34   Dg Chest 1 View  07/14/2015  CLINICAL DATA:  Shortness of breath this evening. EXAM: CHEST 1 VIEW COMPARISON:  07/13/2015 and 07/14/2015 FINDINGS: Sternotomy wires unchanged. Left IJ dialysis catheter and right IJ central venous catheter unchanged. Lungs are hypoinflated with stable left base opacification likely small effusion and associated atelectasis. Mild opacification over the medial right base unchanged which may also be due to small amount of pleural fluid and atelectasis. Cannot exclude infection in the lung bases. Mild stable cardiomegaly. Calcified plaque over the aortic arch stable stent in the left axillary region. IMPRESSION: Stable medial right base opacification and stable left base opacification as findings may be due to small amount of bilateral pleural fluid and associated atelectasis. Cannot exclude infection in the lung bases.  Stable mild cardiomegaly. Aortic atherosclerosis. Tubes and lines as described. Electronically Signed   By: Marin Olp M.D.   On: 07/14/2015 20:44   Dg Abd 1 View  07/16/2015  CLINICAL DATA:  Vomiting. EXAM: ABDOMEN - 1 VIEW COMPARISON:  None FINDINGS: Extensive endoluminal stent graft in the aorta and left common iliac regions. Right external iliac stent. The abdominal gas pattern is negative for obstruction or perforation. No biliary or urinary calculi are evident. IMPRESSION: Negative for obstruction or perforation. Electronically Signed   By: Andreas Newport M.D.   On: 07/16/2015 05:32   Dg Chest [facility]  1 View  07/15/2015  CLINICAL DATA:  Dyspnea EXAM: PORTABLE CHEST 1 VIEW COMPARISON:  07/14/2015 FINDINGS: Cardiac shadow is stable. A dialysis catheter and right jugular catheter are again seen and stable. Postsurgical changes are again noted. Questionable pleural line is noted laterally on the right. This may be related to an underlying skin fold. Bibasilar atelectatic changes are noted with a small left effusion. No other focal abnormality is noted. Left vascular stenting is identified. IMPRESSION: Stable bibasilar changes. Questionable pleural line laterally on the right. This was not seen on the previous exam and is likely related to a skin fold. Continued follow-up is recommended. These results will be called to the ordering clinician or representative by the Radiologist Assistant, and communication documented in the PACS or zVision Dashboard. Electronically Signed   By: Inez Catalina M.D.   On: 07/15/2015 07:30   Dg Chest Port 1 View  07/14/2015  CLINICAL DATA:  Central line placement EXAM: PORTABLE CHEST 1 VIEW COMPARISON:  07/14/2015 FINDINGS: Cardiomegaly again noted. Left IJ catheter with tip in right atrium is unchanged in position. There is right IJ central line with tip in distal SVC. No pneumothorax. Persistent small left pleural effusion with left basilar atelectasis or infiltrate.  IMPRESSION: Left IJ catheter with tip in right atrium is unchanged in position. There is right IJ central line with tip in distal SVC. No pneumothorax. Persistent small left pleural effusion with left basilar atelectasis or infiltrate. Electronically Signed   By: Lahoma Crocker M.D.   On: 07/14/2015 14:43   Dg Chest Port 1 View  07/14/2015  CLINICAL DATA:  Shortness of breath.  Productive cough. EXAM: PORTABLE CHEST 1 VIEW COMPARISON:  07/13/2015 FINDINGS: Cardiomegaly. Prior CABG. Left dialysis catheter remains in place, unchanged. Left lower lobe atelectasis or infiltrate. Increasing right medial basilar airspace opacity as well. Possible small left effusion. No acute bony abnormality. IMPRESSION: Continued left lower lobe atelectasis or infiltrate. Suspect small left effusion. Increasing right medial basilar airspace opacity. Electronically Signed   By: Rolm Baptise M.D.   On: 07/14/2015 12:23     Medications:   . sodium chloride 50 mL/hr at 07/16/15 0754  . DOPamine 2.5 mcg/kg/min (07/16/15 0823)   . acidophilus  1 capsule Oral BID  . antiseptic oral rinse  7 mL Mouth Rinse BID  . aspirin EC  81 mg Oral Daily  . atorvastatin  80 mg Oral QHS  . benzonatate  200 mg Oral TID  . budesonide (PULMICORT) nebulizer solution  0.25 mg Nebulization BID  . famotidine (PEPCID) IV  20 mg Intravenous Q24H  . feeding supplement (ENSURE ENLIVE)  237 mL Oral TID  . insulin aspart  0-20 Units Subcutaneous TID WC  . insulin glargine  20 Units Subcutaneous QHS  . ipratropium-albuterol  3 mL Nebulization BID  . lidocaine (PF)  5 mL Intradermal Once  . magic mouthwash  5 mL Oral TID   And  . lidocaine  5 mL Mouth/Throat TID  . LORazepam  0.25 mg Oral Once per day on Tue Thu Sat  . methylPREDNISolone (SOLU-MEDROL) injection  20 mg Intravenous Q24H  . metronidazole  500 mg Intravenous Q8H  . mupirocin ointment  1 application Nasal BID  . ondansetron (ZOFRAN) IV  4 mg Intravenous Q6H  . sodium chloride flush   3 mL Intravenous Q12H  . vancomycin  500 mg Oral Q6H  . Warfarin - Pharmacist Dosing Inpatient   Does not apply q1800   acetaminophen **OR** acetaminophen, alum &  mag hydroxide-simeth, lidocaine-prilocaine, metoCLOPramide (REGLAN) injection, morphine injection, nitroGLYCERIN, ondansetron (ZOFRAN) IV, traMADol  Assessment/ Plan:  82 y.o. male with end-stage renal disease, hemodialysis [address], Tuesday, Thursday, Saturday, history of severe vascular disease including aortic-iliac bypass,four-vessel CABG at [facility] March 2017, postop wound infection, coronary disease, history of RCA stent, COPD  1. End-stage renal disease.  TuesdayThursday/Saturday.  [address] - dialysis as per his regular schedule - left arm access needs revision which would be scheduled after acute issues are over.  - Dialysis via permcath - Evaluate on Monday if stable for HD  2. Anemia of chronic kidney disease. Hemoglobin 13 at present (likely hemoconcentration) Hold Procrit   3.  Supratherapeutic INR  4. Sepsis   Antibiotics as per primary team. May need to continue iv maintenance fluids due to ongoing losses from diarrhea Consider low dose pressors  5.  Secondary hyperparathyroidism Monitor phosphorus during this admission no binders listed on home meds Phos high at 7.1  6. C. Difficile diarrhea and colitis Currently metronidazole IV, vancomycin oral   LOS: 6 John Robinson 7/2/20179:07 AM

## 2015-07-16 NOTE — Progress Notes (Signed)
Patient ID: John Robinson, male   DOB: 1932/12/16, 82 y.o.   MRN: [id number] [employer] PROGRESS NOTE  [name] [id number] DOB: December 05, 1932 DOA: 07/14/2015 PCP: Lelon Huh, MD  HPI/Subjective: Patient having abdominal pain, nausea and vomiting. Not feeling good at all.  Objective: Filed Vitals:   07/16/15 0700 07/16/15 0800  BP: 109/76 100/70  Pulse: 107 104  Temp:  97.7 F (36.5 C)  Resp: 19 26    Filed Weights   07/11/15 1515 07/11/15 1850 07/13/15 1045  Weight: 72.8 kg (160 lb 7.9 oz) 72.3 kg (159 lb 6.3 oz) 71.4 kg (157 lb 6.5 oz)    ROS: Review of Systems  Constitutional: Negative for fever and chills.  Eyes: Negative for blurred vision.  Respiratory: Positive for cough and shortness of breath.   Cardiovascular: Negative for chest pain.  Gastrointestinal: Positive for nausea, vomiting, abdominal pain and diarrhea. Negative for constipation.  Genitourinary: Negative for dysuria.  Musculoskeletal: Negative for joint pain.  Neurological: Negative for dizziness and headaches.   Exam: Physical Exam  Constitutional: He is oriented to person, place, and time.  HENT:  Nose: No mucosal edema.  Mouth/Throat: No oropharyngeal exudate or posterior oropharyngeal edema.  Eyes: Conjunctivae, EOM and lids are normal. Pupils are equal, round, and reactive to light.  Neck: No JVD present. Carotid bruit is not present. No edema present. No thyroid mass and no thyromegaly present.  Cardiovascular: S1 normal and S2 normal.  Exam reveals no gallop.   No murmur heard. Pulses:      Dorsalis pedis pulses are 2+ on the right side, and 2+ on the left side.  Respiratory: No respiratory distress. He has no wheezes. He has rhonchi in the right middle field, the right lower field, the left middle field and the left lower field. He has no rales.  GI: Bowel sounds are normal. He exhibits distension. There is generalized tenderness.  Musculoskeletal:       Right ankle: He  exhibits no swelling.       Left ankle: He exhibits no swelling.  Lymphadenopathy:    He has no cervical adenopathy.  Neurological: He is alert and oriented to person, place, and time. No cranial nerve deficit.  Skin: Skin is warm. No rash noted. Nails show no clubbing.  Bruising upper and lower extremities.  Psychiatric: He has a normal mood and affect.      Data Reviewed: Basic Metabolic Panel:  Recent Labs Lab 07/13/15 0625 07/14/15 0528 07/14/15 1730 07/15/15 0628 07/16/15 0500  NA 138 139 139 138 138  K 5.4* 4.8 4.5 5.5* 4.9  CL 102 103 104 103 102  CO2 22 26 25 25 23   GLUCOSE 394* 282* 209* 304* 182*  BUN 73* 45* 54* 66* 78*  CREATININE 6.04* 4.21* 4.84* 5.48* 6.22*  CALCIUM 7.7* 7.8* 7.6* 7.8* 7.8*  MG  --   --  2.1 2.3  --   PHOS  --   --  6.3* 7.1*  --    Liver Function Tests:  Recent Labs Lab 06/20/2015 1240  AST 17  ALT 17  ALKPHOS 91  BILITOT 0.5  PROT 5.9*  ALBUMIN 2.5*   CBC:  Recent Labs Lab 07/05/2015 1240  07/12/15 0603 07/13/15 0625 07/14/15 0528 07/14/15 1730 07/15/15 0628 07/16/15 0500  WBC 24.7*  < > 36.1* 44.2* 47.2*  --  44.8* 44.2*  NEUTROABS 21.2*  --   --   --   --   --   --   --  HGB 11.8*  < > 12.4* 14.0 14.2 12.5* 12.8* 13.0  HCT 35.3*  < > 39.3* 43.9 44.4 39.7* 41.3 39.8*  MCV 85.5  < > 87.7 88.1 87.1  --  89.7 87.0  PLT 269  < > 329 303 194  --  151 112*  < > = values in this interval not displayed. Cardiac Enzymes:  Recent Labs Lab 07/04/2015 1240  TROPONINI 0.11*   BNP (last 3 results)  Recent Labs  07/11/2015 1240  BNP 227.0*     CBG:  Recent Labs Lab 07/15/15 0708 07/15/15 1153 07/15/15 1550 07/15/15 2134 07/16/15 0659  GLUCAP 301* 246* 129* 158* 175*    Recent Results (from the past 240 hour(s))  C difficile quick scan w PCR reflex     Status: Abnormal   Collection Time: 07/07/2015  1:45 AM  Result Value Ref Range Status   C Diff antigen POSITIVE (A) NEGATIVE Final   C Diff toxin POSITIVE (A)  NEGATIVE Final   C Diff interpretation   Final    Positive for toxigenic C. difficile, active toxin production present.    Comment: CRITICAL RESULT CALLED TO, READ BACK BY AND VERIFIED WITH:  JACK DOUGHERTY AT 1458 07/11/15 SDR   Blood culture (routine x 2)     Status: None   Collection Time: 07/01/2015  2:10 PM  Result Value Ref Range Status   Specimen Description BLOOD RIGHT ASSIST CONTROL  Final   Special Requests BOTTLES DRAWN AEROBIC AND ANAEROBIC [place]  Final   Culture NO GROWTH 5 DAYS  Final   Report Status 07/15/2015 FINAL  Final  Blood culture (routine x 2)     Status: None   Collection Time: 07/06/2015  2:10 PM  Result Value Ref Range Status   Specimen Description BLOOD RIGHT HAND  Final   Special Requests   Final    BOTTLES DRAWN AEROBIC AND ANAEROBIC [place]   Culture NO GROWTH 5 DAYS  Final   Report Status 07/15/2015 FINAL  Final  MRSA PCR Screening     Status: Abnormal   Collection Time: 07/11/15  7:06 PM  Result Value Ref Range Status   MRSA by PCR POSITIVE (A) NEGATIVE Final    Comment:        The GeneXpert MRSA Assay (FDA approved for NASAL specimens only), is one component of a comprehensive MRSA colonization surveillance program. It is not intended to diagnose MRSA infection nor to guide or monitor treatment for MRSA infections. READ BACK AND VERIFIED BY MARCELLA TURNER AT 2120 07/11/2015.  TFK      Scheduled Meds: . acidophilus  1 capsule Oral BID  . antiseptic oral rinse  7 mL Mouth Rinse BID  . atorvastatin  80 mg Oral QHS  . benzonatate  200 mg Oral TID  . budesonide (PULMICORT) nebulizer solution  0.25 mg Nebulization BID  . famotidine (PEPCID) IV  20 mg Intravenous Q24H  . feeding supplement (ENSURE ENLIVE)  237 mL Oral TID  . insulin aspart  0-20 Units Subcutaneous TID WC  . insulin glargine  20 Units Subcutaneous QHS  . ipratropium-albuterol  3 mL Nebulization BID  . lidocaine (PF)  5 mL Intradermal Once  . magic mouthwash  5  mL Oral TID   And  . lidocaine  5 mL Mouth/Throat TID  . LORazepam  0.25 mg Oral Once per day on Tue Thu Sat  . methylPREDNISolone (SOLU-MEDROL) injection  20 mg Intravenous Q24H  . metronidazole  500 mg Intravenous Q8H  .  ondansetron (ZOFRAN) IV  4 mg Intravenous Q6H  . sodium chloride flush  3 mL Intravenous Q12H  . vancomycin  500 mg Oral Q6H  . Warfarin - Pharmacist Dosing Inpatient   Does not apply q1800    Assessment/Plan:  1. Clinical sepsis present on admission. C. difficile colitis. Continue vancomycin orally and Flagyl IV. Continue standing dose Zofran. Will get ID consult for tomorrow.  With worsening abdominal pain and distension- we will proceed with ct abdomen and pelvis. Case discussed with critical care specialist. Patient full code.  2. Shock- dopamine drip 3. End-stage renal disease on dialysis Tuesday Thursday Saturday 4.   History of coronary artery disease with borderline troponin. Continue aspirin. Troponin elevation secondary to demand ischemia 5.   COPD exacerbation. Solu-Medrol continue budesonide and and nebulizers 6.   Dysphagia with silent aspiration speech therapy following. If there is an aspiration Flagyl should have some coverage. 7.   Stage II decubiti buttock present on admission 8.   Chronic chest wound. Seems to be healing well. 9.   Acute respiratory failure. Oxygen requirements going up to 4 L 10. Atrial fibrillation. INR still therapuetic.  Code Status:     Code Status Orders        Start     Ordered   07/12/2015 1602  Full code   Continuous     06/27/2015 1601    Code Status History    Date Active Date Inactive Code Status Order ID Comments User Context   This patient has a current code status but no historical code status.     Family Communication: Wife yesterday Disposition Plan: To be determined.   Consultants:  Nephrology  Critical care specialist  Antibiotics:  Oral vancomycin  IV Flagyl  Time spent: 25  minutes  [place], [place]

## 2015-07-16 NOTE — Progress Notes (Signed)
Famotidine 20 mg IV q12h changed to q24h based on renal function per protocol.  Lenis Noon, PharmD 07/16/2015 6:21 AM

## 2015-07-17 ENCOUNTER — Inpatient Hospital Stay: Payer: Commercial Managed Care - HMO

## 2015-07-17 DIAGNOSIS — Z7189 Other specified counseling: Secondary | ICD-10-CM

## 2015-07-17 DIAGNOSIS — T17900A Unspecified foreign body in respiratory tract, part unspecified causing asphyxiation, initial encounter: Secondary | ICD-10-CM

## 2015-07-17 DIAGNOSIS — I469 Cardiac arrest, cause unspecified: Secondary | ICD-10-CM

## 2015-07-17 DIAGNOSIS — J9601 Acute respiratory failure with hypoxia: Secondary | ICD-10-CM

## 2015-07-17 DIAGNOSIS — D696 Thrombocytopenia, unspecified: Secondary | ICD-10-CM

## 2015-07-17 DIAGNOSIS — R7989 Other specified abnormal findings of blood chemistry: Secondary | ICD-10-CM

## 2015-07-17 LAB — TROPONIN I
TROPONIN I: 0.12 ng/mL — AB (ref <0.03)
TROPONIN I: 0.19 ng/mL — AB (ref <0.03)
Troponin I: 0.13 ng/mL (ref <0.03)
Troponin I: 0.14 ng/mL (ref <0.03)
Troponin I: 0.36 ng/mL (ref <0.03)
Troponin I: 0.67 ng/mL (ref <0.03)

## 2015-07-17 LAB — BASIC METABOLIC PANEL
ANION GAP: 20 — AB (ref 5–15)
Anion gap: 11 (ref 5–15)
BUN: 92 mg/dL — ABNORMAL HIGH (ref 6–20)
BUN: 99 mg/dL — ABNORMAL HIGH (ref 6–20)
CALCIUM: 7.6 mg/dL — AB (ref 8.9–10.3)
CALCIUM: 7.8 mg/dL — AB (ref 8.9–10.3)
CHLORIDE: 102 mmol/L (ref 101–111)
CO2: 21 mmol/L — AB (ref 22–32)
CO2: 15 mmol/L — AB (ref 22–32)
CREATININE: 6.86 mg/dL — AB (ref 0.61–1.24)
Chloride: 103 mmol/L (ref 101–111)
Creatinine, Ser: 7.67 mg/dL — ABNORMAL HIGH (ref 0.61–1.24)
GFR calc Af Amer: 8 mL/min — ABNORMAL LOW (ref >60)
GFR calc non Af Amer: 7 mL/min — ABNORMAL LOW (ref >60)
GFR calc non Af Amer: 6 mL/min — ABNORMAL LOW (ref >60)
GFR, EST AFRICAN AMERICAN: 7 mL/min — AB (ref >60)
GLUCOSE: 158 mg/dL — AB (ref 65–99)
GLUCOSE: 119 mg/dL — AB (ref 65–99)
POTASSIUM: 7.1 mmol/L — AB (ref 3.5–5.1)
Potassium: 5.6 mmol/L — ABNORMAL HIGH (ref 3.5–5.1)
Sodium: 135 mmol/L (ref 135–145)
Sodium: 137 mmol/L (ref 135–145)

## 2015-07-17 LAB — BLOOD GAS, ARTERIAL
ACID-BASE DEFICIT: 14.8 mmol/L — AB (ref 0.0–2.0)
Allens test (pass/fail): POSITIVE — AB
BICARBONATE: 11.5 meq/L — AB (ref 21.0–28.0)
FIO2: 1
LHR: 24 {breaths}/min
MECHVT: 620 mL
O2 Saturation: 97.1 %
PEEP/CPAP: 5 cmH2O
Patient temperature: 37
pCO2 arterial: 28 mmHg — ABNORMAL LOW (ref 32.0–48.0)
pH, Arterial: 7.22 — ABNORMAL LOW (ref 7.350–7.450)
pO2, Arterial: 109 mmHg — ABNORMAL HIGH (ref 83.0–108.0)

## 2015-07-17 LAB — CBC
HEMATOCRIT: 39.2 % — AB (ref 40.0–52.0)
Hemoglobin: 12.6 g/dL — ABNORMAL LOW (ref 13.0–18.0)
MCH: 28.5 pg (ref 26.0–34.0)
MCHC: 32.1 g/dL (ref 32.0–36.0)
MCV: 88.8 fL (ref 80.0–100.0)
Platelets: 77 10*3/uL — ABNORMAL LOW (ref 150–440)
RBC: 4.41 MIL/uL (ref 4.40–5.90)
RDW: 21.3 % — AB (ref 11.5–14.5)
WBC: 30.3 10*3/uL — ABNORMAL HIGH (ref 3.8–10.6)

## 2015-07-17 LAB — LACTIC ACID, PLASMA
LACTIC ACID, VENOUS: 6.2 mmol/L — AB (ref 0.5–1.9)
LACTIC ACID, VENOUS: 7.2 mmol/L — AB (ref 0.5–1.9)

## 2015-07-17 LAB — EXPECTORATED SPUTUM ASSESSMENT W GRAM STAIN, RFLX TO RESP C: Special Requests: NORMAL

## 2015-07-17 LAB — GLUCOSE, CAPILLARY
GLUCOSE-CAPILLARY: 195 mg/dL — AB (ref 65–99)
GLUCOSE-CAPILLARY: 99 mg/dL (ref 65–99)
Glucose-Capillary: 126 mg/dL — ABNORMAL HIGH (ref 65–99)
Glucose-Capillary: 101 mg/dL — ABNORMAL HIGH (ref 65–99)
Glucose-Capillary: 121 mg/dL — ABNORMAL HIGH (ref 65–99)

## 2015-07-17 LAB — EXPECTORATED SPUTUM ASSESSMENT W REFEX TO RESP CULTURE

## 2015-07-17 LAB — PHOSPHORUS: Phosphorus: 8.7 mg/dL — ABNORMAL HIGH (ref 2.5–4.6)

## 2015-07-17 LAB — PROCALCITONIN: PROCALCITONIN: 1.7 ng/mL

## 2015-07-17 LAB — PROTIME-INR
INR: 4.04 — AB
Prothrombin Time: 38.3 seconds — ABNORMAL HIGH (ref 11.4–15.0)

## 2015-07-17 LAB — MAGNESIUM: Magnesium: 2.8 mg/dL — ABNORMAL HIGH (ref 1.7–2.4)

## 2015-07-17 MED ORDER — IPRATROPIUM-ALBUTEROL 0.5-2.5 (3) MG/3ML IN SOLN: 3.0000 mL | RESPIRATORY_TRACT | Status: DC | PRN

## 2015-07-17 MED ORDER — DOPAMINE-DEXTROSE 3.2-5 MG/ML-% IV SOLN
0.0000 ug/kg/min | INTRAVENOUS | Status: DC
  Filled 2015-07-17: qty 250

## 2015-07-17 MED ORDER — MAGIC MOUTHWASH
5.0000 mL | Freq: Three times a day (TID) | ORAL | Status: DC
  Administered 2015-07-18 (×2): 5 mL via ORAL
  Filled 2015-07-17 (×3): qty 10

## 2015-07-17 MED ORDER — SODIUM BICARBONATE 8.4 % IV SOLN
50.0000 meq | Freq: Once | INTRAVENOUS | Status: AC
  Administered 2015-07-17: 50 meq via INTRAVENOUS
  Filled 2015-07-17: qty 50

## 2015-07-17 MED ORDER — FENTANYL CITRATE (PF) 100 MCG/2ML IJ SOLN
INTRAMUSCULAR | Status: AC
  Filled 2015-07-17: qty 2

## 2015-07-17 MED ORDER — MIDAZOLAM HCL 2 MG/2ML IJ SOLN
INTRAMUSCULAR | Status: AC
  Administered 2015-07-17: 2 mg
  Filled 2015-07-17: qty 4

## 2015-07-17 MED ORDER — VANCOMYCIN HCL 500 MG IV SOLR
500.0000 mg | Freq: Four times a day (QID) | Status: DC
  Administered 2015-07-17 – 2015-07-20 (×11): 500 mg via RECTAL
  Filled 2015-07-17 (×12): qty 500

## 2015-07-17 MED ORDER — SODIUM CHLORIDE 0.9 % IV SOLN
1.0000 g | Freq: Once | INTRAVENOUS | Status: AC
  Administered 2015-07-17: 1 g via INTRAVENOUS
  Filled 2015-07-17: qty 10

## 2015-07-17 MED ORDER — MIDAZOLAM HCL 2 MG/2ML IJ SOLN
1.0000 mg | INTRAMUSCULAR | Status: AC | PRN
  Administered 2015-07-18 – 2015-07-19 (×3): 1 mg via INTRAVENOUS
  Filled 2015-07-17 (×3): qty 2

## 2015-07-17 MED ORDER — INSULIN ASPART 100 UNIT/ML IV SOLN
10.0000 [IU] | Freq: Once | INTRAVENOUS | Status: AC
  Administered 2015-07-17: 10 [IU] via INTRAVENOUS
  Filled 2015-07-17: qty 0.1

## 2015-07-17 MED ORDER — FENTANYL CITRATE (PF) 100 MCG/2ML IJ SOLN: 50.0000 ug | Freq: Once | INTRAMUSCULAR | Status: DC

## 2015-07-17 MED ORDER — SODIUM POLYSTYRENE SULFONATE 15 GM/60ML PO SUSP
30.0000 g | Freq: Once | ORAL | Status: AC
Start: 2015-07-17 — End: 2015-07-17
  Administered 2015-07-17: 30 g
  Filled 2015-07-17: qty 120

## 2015-07-17 MED ORDER — SODIUM BICARBONATE 8.4 % IV SOLN
Freq: Once | INTRAVENOUS | Status: AC
  Administered 2015-07-17: 23:00:00 via INTRAVENOUS
  Filled 2015-07-17: qty 100

## 2015-07-17 MED ORDER — FENTANYL BOLUS VIA INFUSION
25.0000 ug | INTRAVENOUS | Status: DC | PRN
  Administered 2015-07-18 – 2015-07-19 (×6): 25 ug via INTRAVENOUS
  Filled 2015-07-17: qty 25

## 2015-07-17 MED ORDER — MIDAZOLAM HCL 2 MG/2ML IJ SOLN
1.0000 mg | INTRAMUSCULAR | Status: DC | PRN
  Administered 2015-07-18 – 2015-07-19 (×4): 1 mg via INTRAVENOUS
  Filled 2015-07-17 (×4): qty 2

## 2015-07-17 MED ORDER — MIDAZOLAM HCL 2 MG/2ML IJ SOLN
4.0000 mg | Freq: Once | INTRAMUSCULAR | Status: AC
  Administered 2015-07-17: 2 mg via INTRAVENOUS

## 2015-07-17 MED ORDER — NOREPINEPHRINE BITARTRATE 1 MG/ML IV SOLN
2.0000 ug/min | INTRAVENOUS | Status: DC
  Administered 2015-07-17: 24 ug/min via INTRAVENOUS
  Administered 2015-07-18: 32 ug/min via INTRAVENOUS
  Administered 2015-07-18: 50 ug/min via INTRAVENOUS
  Administered 2015-07-18: 35 ug/min via INTRAVENOUS
  Administered 2015-07-19: 16 ug/min via INTRAVENOUS
  Administered 2015-07-19 – 2015-07-20 (×2): 14 ug/min via INTRAVENOUS
  Filled 2015-07-17 (×6): qty 16

## 2015-07-17 MED ORDER — LORAZEPAM 2 MG/ML IJ SOLN: 0.5000 mg | Freq: Once | INTRAMUSCULAR | Status: DC

## 2015-07-17 MED ORDER — SODIUM CHLORIDE 0.9 % IV BOLUS (SEPSIS)
250.0000 mL | Freq: Once | INTRAVENOUS | Status: AC
  Administered 2015-07-17: 250 mL via INTRAVENOUS

## 2015-07-17 MED ORDER — CHLORHEXIDINE GLUCONATE 0.12% ORAL RINSE (MEDLINE KIT)
15.0000 mL | Freq: Two times a day (BID) | OROMUCOSAL | Status: DC
  Administered 2015-07-17 – 2015-07-20 (×6): 15 mL via OROMUCOSAL
  Filled 2015-07-17 (×7): qty 15

## 2015-07-17 MED ORDER — ROCURONIUM BROMIDE 50 MG/5ML IV SOLN
INTRAVENOUS | Status: AC
  Filled 2015-07-17: qty 1

## 2015-07-17 MED ORDER — HYDROCORTISONE NA SUCCINATE PF 100 MG IJ SOLR
50.0000 mg | Freq: Four times a day (QID) | INTRAMUSCULAR | Status: AC
  Administered 2015-07-17 – 2015-07-19 (×8): 50 mg via INTRAVENOUS
  Filled 2015-07-17 (×8): qty 2

## 2015-07-17 MED ORDER — FENTANYL 2500MCG IN NS 250ML (10MCG/ML) PREMIX INFUSION
25.0000 ug/h | INTRAVENOUS | Status: DC
  Administered 2015-07-17: 50 ug/h via INTRAVENOUS
  Administered 2015-07-18: 150 ug/h via INTRAVENOUS
  Administered 2015-07-19: 200 ug/h via INTRAVENOUS
  Administered 2015-07-19: 300 ug/h via INTRAVENOUS
  Administered 2015-07-19: 200 ug/h via INTRAVENOUS
  Administered 2015-07-20: 300 ug/h via INTRAVENOUS
  Filled 2015-07-17 (×6): qty 250

## 2015-07-17 MED ORDER — ROCURONIUM BROMIDE 50 MG/5ML IV SOLN: 50.0000 mg | Freq: Once | INTRAVENOUS | Status: DC

## 2015-07-17 MED ORDER — DEXTROSE 50 % IV SOLN
1.0000 | Freq: Once | INTRAVENOUS | Status: AC
Start: 2015-07-17 — End: 2015-07-17
  Administered 2015-07-17: 50 mL via INTRAVENOUS
  Filled 2015-07-17: qty 50

## 2015-07-17 MED ORDER — IPRATROPIUM-ALBUTEROL 0.5-2.5 (3) MG/3ML IN SOLN
3.0000 mL | RESPIRATORY_TRACT | Status: DC
  Administered 2015-07-17 – 2015-07-20 (×17): 3 mL via RESPIRATORY_TRACT
  Filled 2015-07-17 (×17): qty 3

## 2015-07-17 MED ORDER — NOREPINEPHRINE 4 MG/250ML-% IV SOLN
2.0000 ug/min | INTRAVENOUS | Status: DC
  Administered 2015-07-17: 5 ug/min via INTRAVENOUS

## 2015-07-17 MED ORDER — ANTISEPTIC ORAL RINSE SOLUTION (CORINZ)
7.0000 mL | OROMUCOSAL | Status: DC
  Administered 2015-07-17 – 2015-07-20 (×31): 7 mL via OROMUCOSAL
  Filled 2015-07-17 (×37): qty 7

## 2015-07-17 MED ORDER — NOREPINEPHRINE BITARTRATE 1 MG/ML IV SOLN: 2.0000 ug/min | INTRAVENOUS | Status: DC

## 2015-07-17 MED ORDER — NOREPINEPHRINE 4 MG/250ML-% IV SOLN
INTRAVENOUS | Status: AC
  Administered 2015-07-17: 5 ug/min via INTRAVENOUS
  Filled 2015-07-17: qty 250

## 2015-07-17 NOTE — Progress Notes (Signed)
PT Cancellation Note  Patient Details Name: John Robinson MRN: [id number] DOB: [birth year]   Cancelled Treatment:    Reason Eval/Treat Not Completed: Medical issues which prohibited therapy   Chart reviewed.  Pt with medical decline requiring CPR and intubation.  Will D/C PT due to medical decline and await new orders as appropriate.   Chesley Noon 07/17/2015, 1:33 PM

## 2015-07-17 NOTE — Care Management (Addendum)
Barrier- Dopamine drip.  Increased abdominal pain.  Abd CT- consistent with C Diff.  Oral Vancomycin.  ID consult pending

## 2015-07-17 NOTE — Progress Notes (Signed)
Speech Therapy Note: Reviewed chart notes; pt remains in CCU. Discussed w/ NSG pt's swallowing status and recent MBSS indicating increased risk for aspiration. Discussed that pt's risk for aspiration is increased as he remains sick and weak. Recommended medications be given in puree for safer swallowing. Aspiration precautions posted in room. NSG will discuss w/ MD during rounds pt's higher risk for aspiration as it could impact his overall medical status and recovery. NSG to consult ST services for any reassessment as indicated. NSG agreed.

## 2015-07-17 NOTE — Progress Notes (Signed)
Notified Dr. Stevenson Clinch about patient's blood pressure still being low on dopamine. MD ordered levophed.

## 2015-07-17 NOTE — Progress Notes (Signed)
eLink Physician-Brief Progress Note Patient Name: John Robinson DOB: 1932-09-16 MRN: [id number]   Date of Service  07/17/2015  HPI/Events of Note  K 7.1 acidosis  eICU Interventions  Ca, bicarb push & gtt, insulin/D50/ kayexalate x 1      Intervention Category Major Interventions: Electrolyte abnormality - evaluation and management  ALVA,RAKESH V. 07/17/2015, 10:09 PM

## 2015-07-17 NOTE — Progress Notes (Signed)
At beginning of shift, patient was alert and oriented to all but time on room air without elevated work of breathing. He complained of intense pain in his abdomen and had throughout the previous shift, relived by prn morphine but required the morphine roughly every 3 hours (the dose range). Patient kept stating that he felt he needed to have a bowel movement and was placed on (and then off) the bedpan several times throughout the morning. Patient's belly was distended and taut (growing more firm throughout the morning). In rounds MD acknowledged abdominal pain and desire for bowel movement, but stated pain from collitis and to continue to treat with pain medication for now and that fluid accumulated in tissues per CT cause of distention and and not able to be removed right now. The Charge RN had previous given oral tramadol to help with continued abdominal pain after IV morphine wore off but was not yet available. She placed him on nasal cannula because he was reporting shortness of breath at that time. Towards lunch time patient had started to get a little more confused and was repeating 'help me'. Work of breathing was increased. [place] NP and Dr. Leslye Peer at bedside and after confirmation of code status from patient and family, planned to intubated. As preparing for intubation (before any medications given), patient stopped breathing and pulse lost. CPR initiated immediately and patient intuabted, see Code Blue record. After intubation, pupils unequal, with only one dilated, so patient taken for head CT. Patient began to wake up after intubation and is able to follow commands at this time.

## 2015-07-17 NOTE — Progress Notes (Signed)
Speech Therapy Note: pt required CPR and was orally intubated. Due to pt's declined medical status requiring oral intubation and full vent support at this time, ST services will sign off w/ MD to reconsult when/if indicated.

## 2015-07-17 NOTE — Progress Notes (Signed)
Informed [place] NP of critical lactic acid of 7.2. NP acknolwedged, no new orders at this time.

## 2015-07-17 NOTE — Progress Notes (Signed)
PULMONARY / CRITICAL CARE MEDICINE   Name: John Robinson MRN: [id number] DOB: [birth year]    ADMISSION DATE:  07/14/2015 CONSULTATION DATE: 07/17/15  REFERRING MD:  Sherley Bounds  CHIEF COMPLAINT:  Acute Respiratory failure   PATIENT PROFILE: 82 year old male severe debility, ESRD and deconditioning, status post long hospital admission for CABG, complicated by sternal wound infection. Went home one week ago after 3 months of being in medical facilities, returned with C. difficile with sepsis. Patient noted to have gasping respirations and appeared to be in respiratory distress, therefore decided to intubate the patient.  Patient had brieif loss of pulse, ACLS was initiated with ROSC. Advanced airway placed and the patient is mechanically ventilated.    Dr.Lynita Groseclose has spoken to the family,  Family decides to make him a DNR but otherwise full medical care.  VITAL SIGNS: BP 112/57 mmHg  Pulse 100  Temp(Src) 95.8 F (35.4 C) (Oral)  Resp 25  Ht 6' (1.829 m)  Wt 71.4 kg (157 lb 6.5 oz)  BMI 21.34 kg/m2  SpO2 100%  HEMODYNAMICS:    VENTILATOR SETTINGS: Vent Mode:  [-]  FiO2 (%):  [21 %] 21 %  INTAKE / OUTPUT: I/O last 3 completed shifts: In: 3032.6 [P.O.:593; I.V.:1739.6; IV Piggyback:700] Out: 30 [Emesis/NG output:30]  PHYSICAL EXAMINATION: General: sickly appearing,white male noted to be in respiratory distress. Neuro: obtunded HEENT: Atraumatic, normocephalic, no discharge. Cardiovascular: irregular, S1S2, no MRG noted Lungs: coarse and crackly throughout, symmetrical chest expansion Abdomen:  Distended, tender on light palpation, firm Musculoskeletal:  No inflammation/ deformity noted Skin:   Mid sternal surgical incision, dressing CDI.  LABS:  BMET  Recent Labs Lab 07/15/15 0628 07/16/15 0500 07/17/15 0445  NA 138 138 135  K 5.5* 4.9 5.6*  CL 103 102 103  CO2 25 23 21*  BUN 66* 78* 92*  CREATININE 5.48* 6.22* 6.86*  GLUCOSE 304* 182* 158*     Electrolytes  Recent Labs Lab 07/14/15 1730 07/15/15 0628 07/16/15 0500 07/17/15 0445  CALCIUM 7.6* 7.8* 7.8* 7.6*  MG 2.1 2.3  --  2.8*  PHOS 6.3* 7.1*  --  8.7*    CBC  Recent Labs Lab 07/15/15 0628 07/16/15 0500 07/17/15 0445  WBC 44.8* 44.2* 30.3*  HGB 12.8* 13.0 12.6*  HCT 41.3 39.8* 39.2*  PLT 151 112* 77*    Coag's  Recent Labs Lab 07/15/15 0628 07/16/15 0500 07/17/15 0445  INR 3.45 3.29 4.04*    Sepsis Markers  Recent Labs Lab 06/15/2015 1530 06/22/2015 1924 07/14/15 1730 07/15/15 0628 07/16/15 0500  LATICACIDVEN 1.7 1.6 2.2*  --   --   PROCALCITON  --   --  2.30 2.42 2.06    ABG  Recent Labs Lab 07/14/15 1800  PHART 7.32*  PCO2ART 48    Liver Enzymes No results for input(s): AST, ALT, ALKPHOS, BILITOT, ALBUMIN in the last 168 hours.  Cardiac Enzymes  Recent Labs Lab 07/16/15 2340 07/17/15 0445 07/17/15 1045  TROPONINI 0.12* 0.13* 0.14*    Glucose  Recent Labs Lab 07/16/15 0659 07/16/15 1152 07/16/15 1716 07/16/15 2112 07/17/15 0737 07/17/15 1129  GLUCAP 175* 117* 167* 160* 126* 99    Imaging Dg Abd 1 View  07/17/2015  CLINICAL DATA:  Post OG tube placement EXAM: ABDOMEN - 1 VIEW COMPARISON:  CT 07/16/2015 FINDINGS: Enteric tube has been placed with the tip in the distal stomach. Mild gaseous distention of the stomach. Prior stent graft repair of abdominal aortic aneurysm. Gas throughout mildly prominent large and small  bowel loops. IMPRESSION: NG tube tip in the distal stomach. Electronically Signed   By: Rolm Baptise M.D.   On: 07/17/2015 13:29     STUDIES:  7/3 CT head without contrast>>No ICH 7/3 CT abdomen and pelvis>>Diffuse edematous wall thickening, consistent with the reported history of C. difficile infection. . Small the moderate bilateral pleural effusions with bilaterallower lobe collapse/consolidation. Small to moderate volume ascites. Abdominal aortic atherosclerosis.5. Borderline retroperitoneal  lymphadenopathy. Other incidental findings as outlined above.  CULTURES: 7/3 sputum culture>> 6/26 blood culture NGTD 6/26 C-Diff>> Positive  ANTIBIOTICS: vancomycin 6/26 >> 6/27 Azithromycin 6/26 >> 6/27 cefepime 6/26 >> 6/28 metronidazole 6/27 >> Vancomycin PO 6/27 >>   SIGNIFICANT EVENTS 07/17/15>>Patient had brief episode of loss of pulse and ACLS started, received epi x 2, bicarb x 1 7/3>>  Patient was in respiratory distress requiring emergent intubation,    LINES/TUBES: 7/3 ET tube>> 6/30 Right internal jugular>>   ASSESSMENT / PLAN:  PULMONARY A: Acute hypoxemic respiratory failure Aspiration PNA Severe COPD/Emphysema  P:   Full vent support SBT trials in morning Fentanyl/Versed Routine ABG CXR in am  bronchodilators Keep oxygen sats  greater than 88% Sputum culture Solu-Cortef 50 mg every 6 hours   CARDIOVASCULAR A:  Cardiopulmonary arrest possibly related to aspiration Septic shock related to C. Difficile A. fib with RVR P:  Stat EKG Follow troponins Levoped/ dopamine MAP> 65   RENAL A:   ESRD On HD P:   Nephrology following  GASTROINTESTINAL A:   C difficile colitis P:   Continue Flagyl and oral vancomycin for C. difficile sepsis. Check pro calcitonin and monitor for fever. Lactic acid Follow cultures  HEMATOLOGIC A:   H/O Severe peripheral vascular disease P:  INR-4.04, hold Coumadin  INFECTIOUS A:   Leukocytosis with increasing white cell count, due to C. difficile with sepsis and steroids P:   Continue Flagyl and oral vancomycin for C. difficile sepsis. Check pro calcitonin and monitor for fever. f/u cultures Follow lactic acid Follow pro-calcitonin  ENDOCRINE Hyperglycemia-steroid induced  P:  Continue Levemir to 20 units daily at bedtime, and continue NovoLog correction scale. Blood sugar checks every 4 hours NEUROLOGIC   P:   RASS goal 0- -1  CT head>> 7/3No acute intracranial  abnormality.Marland Kitchen    FAMILY  - Updates: Family was updated by Dr. Vilinda Boehringer.  Family has decided to make the patient DNR but would like full medical care at this time.     [facility] [place]   07/17/2015, 1:35 PM  STAFF NOTE: I, Dr. Vilinda Boehringer have personally reviewed patient's available data, including medical history, events of note, physical examination and test results as part of my evaluation. I have discussed with NP Varughese  and other care providers such as pharmacist, RN and RRT.  In addition,  I personally evaluated patient and elicited key findings of   A: A 7-year-old male with past medical history of end-stage renal disease on hemodialysis, CABG with complications of sternal wound infection serration pneumonia and bacteremia, now admitted with C. difficile colitis and hypotension, had a PEA arrest secondary to hypoxia on 7/3, with return of spontaneous circulation, and intubation.  Severe C. difficile colitis Sepsis Possible bilateral multifocal pneumonia Procedure disease on dialysis PEA arrest Hypoxic respiratory failure requiring intubation Suspected aspiration event  P:   -I suspect patient had a acute aspiration event given the level of secretions noted around his vocal cords and leaking  into history daily during intubation. He was noted to be pale and moderately cyanotic at the time of my evaluation. -Status post ACLS protocol for PEA arrest -Continue mechanical ventilation, check cultures, check chest x-ray, check abdominal x-ray, follow-up labs and ABG. - Has CT reviewed, no significant findings for anoxic brain injury. -At this time will continue at full medical treatment and respect family wishes -I have spoken with wife and son, who have both stated that patient has been through quite a bit since his CABG earlier on this year, and they have now made him DO NOT  RESUSCITATE.    Marland Kitchen  Rest per NP/medical resident whose note is outlined above and that I agree with  The patient is critically ill with multiple organ systems failure and requires high complexity decision making for assessment and support, frequent evaluation and titration of therapies, application of advanced monitoring technologies and extensive interpretation of multiple databases.   Critical Care Time devoted to patient care services described in this note is  45 Minutes.   This time reflects time of care of this signee Dr Vilinda Boehringer.  This critical care time does not reflect procedure time, or teaching time or supervisory time of PA/NP/Med-student/Med Resident etc but could involve care discussion time.  Vilinda Boehringer, MD [facility] Pager [phone] (please enter 7-digits) On Call Pager [phone] (please enter 7-digits)  Note: This note was prepared with Dragon dictation along with smaller phrase technology. Any transcriptional errors that result from this process are unintentional.

## 2015-07-17 NOTE — Progress Notes (Signed)
Lab reports critical INR of 4.04.  Informed Magadalene Tukov, NP of result.  Will continue to monitor. 

## 2015-07-17 NOTE — Progress Notes (Signed)
ANTICOAGULATION CONSULT NOTE - Follow up [facility] for Warfarin  Indication: VTE prophylaxis  Allergies  Allergen Reactions  . Pletal [Cilostazol] Other (See Comments)    Reaction:  Dizziness    Patient Measurements: Height: 6' (182.9 cm) Weight: 157 lb 6.5 oz (71.4 kg) IBW/kg (Calculated) : 77.6  Vital Signs: Temp: 95.8 F (35.4 C) (07/03 0741) Temp Source: Oral (07/03 0741) BP: 102/63 mmHg (07/03 0930) Pulse Rate: 89 (07/03 0845)  Labs:  Recent Labs  07/15/15 0628 07/16/15 0500  07/16/15 1618 07/16/15 2340 07/17/15 0445  HGB 12.8* 13.0  --   --   --  12.6*  HCT 41.3 39.8*  --   --   --  39.2*  PLT 151 112*  --   --   --  77*  LABPROT 34.0* 32.8*  --   --   --  38.3*  INR 3.45 3.29  --   --   --  4.04*  CREATININE 5.48* 6.22*  --   --   --  6.86*  TROPONINI  --   --   < > 0.11* 0.12* 0.13*  < > = values in this interval not displayed.  Estimated Creatinine Clearance: 8.5 mL/min (by C-G formula based on Cr of 6.86).  Assessment: Pharmacy consulted to dose warfarin in this 82 year old male admitted with PNA and Cdiff. Currently also on metronidazole and oral vancomycin.  Pt was on warfarin 5 mg PO QHS at home.  INR on 6/26 was 4.25.   6/28 INR  2.95  Warfarin 2 mg 6/29 INR  3.78  none 6/30 INR  4.06  none 7/1   INR  3.45  none 7/2   INR  3.29  none 7/3   INR  4.04  Goal of Therapy:  INR 2-3   Plan:  INR remains supratherapeutic. Will continue to hold Coumadin and f/u AM INR.   Ulice Dash D 07/17/2015,9:53 AM

## 2015-07-17 NOTE — Consult Note (Signed)
[facility] Infectious Disease     Reason for Consult: C diff, aspiration PNA   Referring Physician: Gaye Pollack Date of Admission:  06/29/2015   Active Problems:   Pneumonia   Protein-calorie malnutrition, severe   DNR (do not resuscitate) discussion   Palliative care encounter   Adult failure to thrive   Severe sepsis ([place])   HPI: John Robinson is a 82 y.o. male admitted 6/26 with SOB, diff swallowing cough, diarrhea.  On admit had wbc 24 K. CXR with probable PNA, started on IV abx with vanco and cefepime.  Since then has been dxed with C diff and wbc up to 45K.  He today had worsening resp distress and was intubated. High concern for aspiration at well He has had a complicated course since CABG 4/27 complicated by PEA arrest, bacteremia and PNA and line infection with serratia , ESRD, A flutter and infection of his sternal incision requiring debridement and wound vac. Had been at SNF since dc from [facility]  Past Medical History  Diagnosis Date  . Chicken pox   . Measles   . Mumps   . History of MI (myocardial infarction)   . History of malignant melanoma   . History of recurrent UTIs   . Coronary artery disease   . Hypertension   . Chronic kidney disease   . GERD (gastroesophageal reflux disease)   . Renal insufficiency    Past Surgical History  Procedure Laterality Date  . Rca stent s/p mi  12/05/2000  . Arm surgery Left   . Hip surgery Left   . Appendectomy  1957  . Coronary angioplasty Right 12/22/1996    Right Iliac with stent  . Peripheral vascular catheterization N/A 05/18/2014    Procedure: A/V Shuntogram/Fistulagram;  Surgeon: Algernon Huxley, MD;  Location: [place] [facility];  Service: Cardiovascular;  Laterality: N/A;  . Tympanoplasty Left 11/2008    Left ear/Dr. Tami Ribas  . Aorta-iliac-femoral bypass  02/2012    Aorta-left iliac bypass with iliac right femoral bypass. Repair pseudoanuerysm. Done at [facility]  . Aorta-iliac-femoral bypass  1987  . Melanoma excision   05/25/2002    Chest wall  . Lad and rca stent  02/09/1999  . Peripheral vascular catheterization N/A 06/08/2014    Procedure: Dialysis/Perma Catheter Removal;  Surgeon: Algernon Huxley, MD;  Location: [place] [facility];  Service: Cardiovascular;  Laterality: N/A;  . Peripheral vascular catheterization N/A 10/06/2014    Procedure: A/V Shuntogram/Fistulagram;  Surgeon: Algernon Huxley, MD;  Location: [place] [facility];  Service: Cardiovascular;  Laterality: N/A;  . Peripheral vascular catheterization Left 10/06/2014    Procedure: A/V Shunt Intervention;  Surgeon: Algernon Huxley, MD;  Location: [place] [facility];  Service: Cardiovascular;  Laterality: Left;  . Coronary artery bypass graft  04/03/2015    4V at [place] History  Substance Use Topics  . Smoking status: Former Smoker -- 0.25 packs/day    Types: Cigarettes    Quit date: 05/17/2012  . Smokeless tobacco: Never Used  . Alcohol Use: No   Family History  Problem Relation Age of Onset  . Cancer Mother     Lymphatic cancer  . Lung cancer Father   . Diabetes Brother     type 2  . Heart attack Brother     Allergies:  Allergies  Allergen Reactions  . Pletal [Cilostazol] Other (See Comments)    Reaction:  Dizziness    Current antibiotics: Antibiotics Given (last  72 hours)    Date/Time Action Medication Dose Rate   07/14/15 1751 Given   vancomycin (VANCOCIN) 50 mg/mL oral solution 500 mg 500 mg    07/14/15 2101 Given   metroNIDAZOLE (FLAGYL) tablet 500 mg 500 mg    07/14/15 2303 Given   vancomycin (VANCOCIN) 50 mg/mL oral solution 500 mg 500 mg    07/15/15 0510 Given   vancomycin (VANCOCIN) 50 mg/mL oral solution 500 mg 500 mg    07/15/15 0510 Given   metroNIDAZOLE (FLAGYL) tablet 500 mg 500 mg    07/15/15 1310 Given   metroNIDAZOLE (FLAGYL) IVPB 500 mg 500 mg 100 mL/hr   07/15/15 1731 Given   vancomycin (VANCOCIN) 50 mg/mL oral solution 500 mg 500 mg    07/15/15 2018 Given   metroNIDAZOLE (FLAGYL) IVPB 500 mg 500 mg  100 mL/hr   07/16/15 0058 Given   vancomycin (VANCOCIN) 50 mg/mL oral solution 500 mg 500 mg    07/16/15 0427 Given   metroNIDAZOLE (FLAGYL) IVPB 500 mg 500 mg 100 mL/hr   07/16/15 1336 Given   metroNIDAZOLE (FLAGYL) IVPB 500 mg 500 mg 100 mL/hr   07/16/15 1340 Given   vancomycin (VANCOCIN) 50 mg/mL oral solution 500 mg 500 mg    07/16/15 1950 Given   metroNIDAZOLE (FLAGYL) IVPB 500 mg 500 mg 100 mL/hr   07/16/15 1953 Given   vancomycin (VANCOCIN) 50 mg/mL oral solution 500 mg 500 mg    07/17/15 0409 Given   metroNIDAZOLE (FLAGYL) IVPB 500 mg 500 mg 100 mL/hr   07/17/15 0410 Given   vancomycin (VANCOCIN) 50 mg/mL oral solution 500 mg 500 mg    07/17/15 0831 Given   vancomycin (VANCOCIN) 50 mg/mL oral solution 500 mg 500 mg    07/17/15 1418 Given   metroNIDAZOLE (FLAGYL) IVPB 500 mg 500 mg 100 mL/hr   07/17/15 1419 Given   vancomycin (VANCOCIN) 50 mg/mL oral solution 500 mg 500 mg       MEDICATIONS: . acidophilus  1 capsule Oral BID  . antiseptic oral rinse  7 mL Mouth Rinse BID  . atorvastatin  80 mg Oral QHS  . benzonatate  200 mg Oral TID  . budesonide (PULMICORT) nebulizer solution  0.25 mg Nebulization BID  . famotidine (PEPCID) IV  20 mg Intravenous Q24H  . feeding supplement (ENSURE ENLIVE)  237 mL Oral TID  . fentaNYL      . fentaNYL (SUBLIMAZE) injection  50 mcg Intravenous Once  . fentaNYL (SUBLIMAZE) injection  50 mcg Intravenous Once  . hydrocortisone sod succinate (SOLU-CORTEF) inj  50 mg Intravenous Q6H  . insulin aspart  0-20 Units Subcutaneous TID WC  . insulin glargine  20 Units Subcutaneous QHS  . ipratropium-albuterol  3 mL Nebulization Q4H  . lidocaine (PF)  5 mL Intradermal Once  . LORazepam  0.5 mg Intravenous Once  . LORazepam  0.25 mg Oral Once per day on Tue Thu Sat  . magic mouthwash  5 mL Oral TID  . metronidazole  500 mg Intravenous Q8H  . midazolam  4 mg Intravenous Once  . norepinephrine      . ondansetron (ZOFRAN) IV  4 mg Intravenous Q6H   . rocuronium      . rocuronium  50 mg Intravenous Once  . sodium chloride flush  3 mL Intravenous Q12H  . vancomycin  500 mg Oral Q6H  . Warfarin - Pharmacist Dosing Inpatient   Does not apply q1800    Review of Systems - unable to  obtain OBJECTIVE: Temp:  [95.8 F (35.4 C)-97.9 F (36.6 C)] 95.8 F (35.4 C) (07/03 0741) Pulse Rate:  [34-113] 100 (07/03 1307) Resp:  [12-30] 25 (07/03 1307) BP: (83-230)/(52-118) 112/57 mmHg (07/03 1306) SpO2:  [54 %-100 %] 100 % (07/03 1307) FiO2 (%):  [21 %] 21 % (07/03 0804) Physical Exam  Constitutional: critically ill, he is awake however HENT: anicteric,  Mouth/Throat: ETT in place, OGT in place Cardiovascular: Tachy and regular Pulmonary/Chest: bibasilar rhonchi  Abdominal: distended, quiet BS, grimaces with percussion, Lymphadenopathy: He has no cervical adenopathy.  Neurological: intubated but awake  Skin: multiple bruising Ext cool and clammy, cyanosis on fingertips Psychiatric: sedate  LABS: Results for orders placed or performed during the hospital encounter of 06/30/2015 (from the past 48 hour(s))  Glucose, capillary     Status: Abnormal   Collection Time: 07/15/15  3:50 PM  Result Value Ref Range   Glucose-Capillary 129 (H) 65 - 99 mg/dL  Glucose, capillary     Status: Abnormal   Collection Time: 07/15/15  9:34 PM  Result Value Ref Range   Glucose-Capillary 158 (H) 65 - 99 mg/dL   Comment 1 Notify RN   Protime-INR     Status: Abnormal   Collection Time: 07/16/15  5:00 AM  Result Value Ref Range   Prothrombin Time 32.8 (H) 11.4 - 15.0 seconds   INR 3.29   Procalcitonin     Status: None   Collection Time: 07/16/15  5:00 AM  Result Value Ref Range   Procalcitonin 2.06 ng/mL    Comment:        Interpretation: PCT > 2 ng/mL: Systemic infection (sepsis) is likely, unless other causes are known. (NOTE)         ICU PCT Algorithm               Non ICU PCT Algorithm    ----------------------------      ------------------------------         PCT < 0.25 ng/mL                 PCT < 0.1 ng/mL     Stopping of antibiotics            Stopping of antibiotics       strongly encouraged.               strongly encouraged.    ----------------------------     ------------------------------       PCT level decrease by               PCT < 0.25 ng/mL       >= 80% from peak PCT       OR PCT 0.25 - 0.5 ng/mL          Stopping of antibiotics                                             encouraged.     Stopping of antibiotics           encouraged.    ----------------------------     ------------------------------       PCT level decrease by              PCT >= 0.25 ng/mL       < 80% from peak PCT        AND PCT >= 0.5 ng/mL  Continuing antibiotics                                               encouraged.       Continuing antibiotics            encouraged.    ----------------------------     ------------------------------     PCT level increase compared          PCT > 0.5 ng/mL         with peak PCT AND          PCT >= 0.5 ng/mL             Escalation of antibiotics                                          strongly encouraged.      Escalation of antibiotics        strongly encouraged.   CBC     Status: Abnormal   Collection Time: 07/16/15  5:00 AM  Result Value Ref Range   WBC 44.2 (H) 3.8 - 10.6 K/uL   RBC 4.57 4.40 - 5.90 MIL/uL   Hemoglobin 13.0 13.0 - 18.0 g/dL   HCT 39.8 (L) 40.0 - 52.0 %   MCV 87.0 80.0 - 100.0 fL   MCH 28.3 26.0 - 34.0 pg   MCHC 32.5 32.0 - 36.0 g/dL   RDW 21.4 (H) 11.5 - 14.5 %   Platelets 112 (L) 150 - 440 K/uL  Basic metabolic panel     Status: Abnormal   Collection Time: 07/16/15  5:00 AM  Result Value Ref Range   Sodium 138 135 - 145 mmol/L   Potassium 4.9 3.5 - 5.1 mmol/L   Chloride 102 101 - 111 mmol/L   CO2 23 22 - 32 mmol/L   Glucose, Bld 182 (H) 65 - 99 mg/dL   BUN 78 (H) 6 - 20 mg/dL   Creatinine, Ser 6.22 (H) 0.61 - 1.24 mg/dL   Calcium 7.8  (L) 8.9 - 10.3 mg/dL   GFR calc non Af Amer 8 (L) >60 mL/min   GFR calc Af Amer 9 (L) >60 mL/min    Comment: (NOTE) The eGFR has been calculated using the CKD EPI equation. This calculation has not been validated in all clinical situations. eGFR's persistently <60 mL/min signify possible Chronic Kidney Disease.    Anion gap 13 5 - 15  Glucose, capillary     Status: Abnormal   Collection Time: 07/16/15  6:59 AM  Result Value Ref Range   Glucose-Capillary 175 (H) 65 - 99 mg/dL  Troponin I     Status: Abnormal   Collection Time: 07/16/15 10:30 AM  Result Value Ref Range   Troponin I 0.11 (HH) <0.03 ng/mL    Comment: CRITICAL RESULT CALLED TO, READ BACK BY AND VERIFIED WITH CHERYL GREENE ON 07/16/15 AT 1116 [place]   Glucose, capillary     Status: Abnormal   Collection Time: 07/16/15 11:52 AM  Result Value Ref Range   Glucose-Capillary 117 (H) 65 - 99 mg/dL  Troponin I     Status: Abnormal   Collection Time: 07/16/15  4:18 PM  Result Value Ref Range   Troponin I 0.11 (HH) <0.03 ng/mL  Comment: CRITICAL VALUE NOTED. VALUE IS CONSISTENT WITH PREVIOUSLY REPORTED/CALLED VALUE [place]  Amylase     Status: None   Collection Time: 07/16/15  4:18 PM  Result Value Ref Range   Amylase 52 28 - 100 U/L  Lipase, blood     Status: None   Collection Time: 07/16/15  4:18 PM  Result Value Ref Range   Lipase 21 11 - 51 U/L  Glucose, capillary     Status: Abnormal   Collection Time: 07/16/15  5:16 PM  Result Value Ref Range   Glucose-Capillary 167 (H) 65 - 99 mg/dL  Glucose, capillary     Status: Abnormal   Collection Time: 07/16/15  9:12 PM  Result Value Ref Range   Glucose-Capillary 160 (H) 65 - 99 mg/dL  Troponin I     Status: Abnormal   Collection Time: 07/16/15 11:40 PM  Result Value Ref Range   Troponin I 0.12 (HH) <0.03 ng/mL    Comment: CRITICAL VALUE NOTED. VALUE IS CONSISTENT WITH PREVIOUSLY REPORTED/CALLED VALUE  Troponin I     Status: Abnormal   Collection Time: 07/17/15  4:45 AM   Result Value Ref Range   Troponin I 0.13 (HH) <0.03 ng/mL    Comment: CRITICAL VALUE NOTED. VALUE IS CONSISTENT WITH PREVIOUSLY REPORTED/CALLED VALUE  Protime-INR     Status: Abnormal   Collection Time: 07/17/15  4:45 AM  Result Value Ref Range   Prothrombin Time 38.3 (H) 11.4 - 15.0 seconds   INR 4.04 (HH)     Comment: RESULT REPEATED AND VERIFIED CRITICAL RESULT CALLED TO, READ BACK BY AND VERIFIED WITH: DALE HOPKINS AT 0606 07/17/15.PMH   CBC     Status: Abnormal   Collection Time: 07/17/15  4:45 AM  Result Value Ref Range   WBC 30.3 (H) 3.8 - 10.6 K/uL   RBC 4.41 4.40 - 5.90 MIL/uL   Hemoglobin 12.6 (L) 13.0 - 18.0 g/dL   HCT 39.2 (L) 40.0 - 52.0 %   MCV 88.8 80.0 - 100.0 fL   MCH 28.5 26.0 - 34.0 pg   MCHC 32.1 32.0 - 36.0 g/dL   RDW 21.3 (H) 11.5 - 14.5 %   Platelets 77 (L) 150 - 440 K/uL  Basic metabolic panel     Status: Abnormal   Collection Time: 07/17/15  4:45 AM  Result Value Ref Range   Sodium 135 135 - 145 mmol/L   Potassium 5.6 (H) 3.5 - 5.1 mmol/L   Chloride 103 101 - 111 mmol/L   CO2 21 (L) 22 - 32 mmol/L   Glucose, Bld 158 (H) 65 - 99 mg/dL   BUN 92 (H) 6 - 20 mg/dL   Creatinine, Ser 6.86 (H) 0.61 - 1.24 mg/dL   Calcium 7.6 (L) 8.9 - 10.3 mg/dL   GFR calc non Af Amer 7 (L) >60 mL/min   GFR calc Af Amer 8 (L) >60 mL/min    Comment: (NOTE) The eGFR has been calculated using the CKD EPI equation. This calculation has not been validated in all clinical situations. eGFR's persistently <60 mL/min signify possible Chronic Kidney Disease.    Anion gap 11 5 - 15  Magnesium     Status: Abnormal   Collection Time: 07/17/15  4:45 AM  Result Value Ref Range   Magnesium 2.8 (H) 1.7 - 2.4 mg/dL  Phosphorus     Status: Abnormal   Collection Time: 07/17/15  4:45 AM  Result Value Ref Range   Phosphorus 8.7 (H) 2.5 - 4.6 mg/dL  Glucose, capillary     Status: Abnormal   Collection Time: 07/17/15  7:37 AM  Result Value Ref Range   Glucose-Capillary 126 (H) 65 - 99  mg/dL  Troponin I     Status: Abnormal   Collection Time: 07/17/15 10:45 AM  Result Value Ref Range   Troponin I 0.14 (HH) <0.03 ng/mL    Comment: CRITICAL VALUE NOTED. VALUE IS CONSISTENT WITH PREVIOUSLY REPORTED/CALLED VALUE BY [facility] AT 1116 07/16/15 DAS   Glucose, capillary     Status: None   Collection Time: 07/17/15 11:29 AM  Result Value Ref Range   Glucose-Capillary 99 65 - 99 mg/dL   No components found for: ESR, C REACTIVE PROTEIN MICRO: Recent Results (from the past 720 hour(s))  C difficile quick scan w PCR reflex     Status: Abnormal   Collection Time: 07/14/2015  1:45 AM  Result Value Ref Range Status   C Diff antigen POSITIVE (A) NEGATIVE Final   C Diff toxin POSITIVE (A) NEGATIVE Final   C Diff interpretation   Final    Positive for toxigenic C. difficile, active toxin production present.    Comment: CRITICAL RESULT CALLED TO, READ BACK BY AND VERIFIED WITH:  JACK DOUGHERTY AT 1458 07/11/15 SDR   Blood culture (routine x 2)     Status: None   Collection Time: 07/05/2015  2:10 PM  Result Value Ref Range Status   Specimen Description BLOOD RIGHT ASSIST CONTROL  Final   Special Requests BOTTLES DRAWN AEROBIC AND ANAEROBIC [place]  Final   Culture NO GROWTH 5 DAYS  Final   Report Status 07/15/2015 FINAL  Final  Blood culture (routine x 2)     Status: None   Collection Time: 07/11/2015  2:10 PM  Result Value Ref Range Status   Specimen Description BLOOD RIGHT HAND  Final   Special Requests   Final    BOTTLES DRAWN AEROBIC AND ANAEROBIC [place]   Culture NO GROWTH 5 DAYS  Final   Report Status 07/15/2015 FINAL  Final  MRSA PCR Screening     Status: Abnormal   Collection Time: 07/11/15  7:06 PM  Result Value Ref Range Status   MRSA by PCR POSITIVE (A) NEGATIVE Final    Comment:        The GeneXpert MRSA Assay (FDA approved for NASAL specimens only), is one component of a comprehensive MRSA colonization surveillance program. It is not intended to diagnose  MRSA infection nor to guide or monitor treatment for MRSA infections. READ BACK AND VERIFIED BY MARCELLA TURNER AT 2120 07/11/2015.  TFK     IMAGING: Dg Chest 1 View  07/16/2015  CLINICAL DATA:  Dyspnea. EXAM: CHEST 1 VIEW COMPARISON:  07/15/2015 FINDINGS: Dense consolidation in the left base. Patchy opacities in the central lung regions. Bilateral knee jugular venous catheters appear satisfactorily positioned, extending into the right atrium. Pulmonary vasculature is normal. IMPRESSION: Unchanged left base consolidation and mild central perihilar airspace opacities. This could represent infectious infiltrate. Electronically Signed   By: Andreas Newport M.D.   On: 07/16/2015 05:34   Dg Chest 1 View  07/14/2015  CLINICAL DATA:  Shortness of breath this evening. EXAM: CHEST 1 VIEW COMPARISON:  07/13/2015 and 07/14/2015 FINDINGS: Sternotomy wires unchanged. Left IJ dialysis catheter and right IJ central venous catheter unchanged. Lungs are hypoinflated with stable left base opacification likely small effusion and associated atelectasis. Mild opacification over the medial right base unchanged which may also be due to small amount of pleural  fluid and atelectasis. Cannot exclude infection in the lung bases. Mild stable cardiomegaly. Calcified plaque over the aortic arch stable stent in the left axillary region. IMPRESSION: Stable medial right base opacification and stable left base opacification as findings may be due to small amount of bilateral pleural fluid and associated atelectasis. Cannot exclude infection in the lung bases. Stable mild cardiomegaly. Aortic atherosclerosis. Tubes and lines as described. Electronically Signed   By: Marin Olp M.D.   On: 07/14/2015 20:44   Dg Chest 1 View  07/13/2015  CLINICAL DATA:  Increased SOB, Recent heart surgery 3-4 weeks ago, hx MI, CAD, HTN EXAM: CHEST 1 VIEW COMPARISON:  06/23/2015 FINDINGS: Left-sided dialysis catheter tips overlie the lower superior  vena cava and upper right atrium. The heart is enlarged. Status post median sternotomy and CABG. There is new opacification at the left lung base which completely obscures the left hemidiaphragm. No pulmonary edema. Small bilateral pleural effusions are present. IMPRESSION: 1. New opacification of the left lower lobe consistent with atelectasis or infiltrate. 2. Bilateral small effusions. Electronically Signed   By: Nolon Nations M.D.   On: 07/13/2015 09:13   Dg Abd 1 View  07/17/2015  CLINICAL DATA:  Post OG tube placement EXAM: ABDOMEN - 1 VIEW COMPARISON:  CT 07/16/2015 FINDINGS: Enteric tube has been placed with the tip in the distal stomach. Mild gaseous distention of the stomach. Prior stent graft repair of abdominal aortic aneurysm. Gas throughout mildly prominent large and small bowel loops. IMPRESSION: NG tube tip in the distal stomach. Electronically Signed   By: Rolm Baptise M.D.   On: 07/17/2015 13:29   Dg Abd 1 View  07/16/2015  CLINICAL DATA:  Vomiting. EXAM: ABDOMEN - 1 VIEW COMPARISON:  None FINDINGS: Extensive endoluminal stent graft in the aorta and left common iliac regions. Right external iliac stent. The abdominal gas pattern is negative for obstruction or perforation. No biliary or urinary calculi are evident. IMPRESSION: Negative for obstruction or perforation. Electronically Signed   By: Andreas Newport M.D.   On: 07/16/2015 05:32   Ct Head Wo Contrast  07/17/2015  CLINICAL DATA:  Postop cardiac arrest EXAM: CT HEAD WITHOUT CONTRAST TECHNIQUE: Contiguous axial images were obtained from the base of the skull through the vertex without intravenous contrast. COMPARISON:  11/28/2013 FINDINGS: Brain: No intracranial hemorrhage, mass effect or midline shift. Stable cerebral atrophy. Stable mild periventricular chronic white matter disease. No definite acute cortical infarction. No mass lesion is noted on this unenhanced scan. Ventricular size is stable from prior exam. Vascular: Mild  atherosclerotic calcifications of carotid siphon. Skull: No skull fracture is noted. Sinuses/Orbits: Paranasal sinuses are unremarkable. Stable postsurgical changes left mastoid. Some fluid in left mastoid air cells again noted. Other: None IMPRESSION: No acute intracranial abnormality. No definite acute cortical infarction. Stable atrophy and chronic white matter disease. Electronically Signed   By: Lahoma Crocker M.D.   On: 07/17/2015 14:05   Ct Chest Wo Contrast  06/15/2015  CLINICAL DATA:  Breathing difficulty. Recent CABG and pneumonia at [facility]. Dialysis patient. EXAM: CT CHEST WITHOUT CONTRAST TECHNIQUE: Multidetector CT imaging of the chest was performed following the standard protocol without IV contrast. COMPARISON:  Abdominal CT 03/04/2012 FINDINGS: Left IJ central venous catheter with tip over the cavoatrial junction. Cardiovascular: Sternotomy wires present. Evidence of patient's recent coronary bypass. Suggestion a stent over the anterior descending coronary artery. Mild prominence heart. No significant pericardial collection. Mild atherosclerotic thoracic aorta. Mediastinum: No mediastinal or adenopathy. Remaining mediastinal structures  within normal. Lungs/Pleura: Lungs adequately inflated and demonstrate bilateral patchy hazy nodular airspace process right worse than left likely due to infection. Linear atelectasis over the left base. Small amount left pleural fluid. Possible tiny amount of aspirate material over the distal trachea. Upper Abdomen: Possible subtle cholelithiasis. 1.5 cm hypodensity over the upper pole left kidney unchanged likely slightly hyperdense cyst. Left renal cortical atrophy unchanged. Colon is only partly visualized although there does appear to be wall thickening/submucosal edema with adjacent inflammatory change in the pericolonic fat suggesting an acute colitis. Partially visualized aortic stent graft. Musculoskeletal: Moderate degenerate changes spine. Minimal loss mid to  anterior vertebral body height of a lower thoracic vertebral body unchanged. IMPRESSION: Bilateral patchy hazy nodular airspace process right worse than left likely a pneumonia. Minimal linear atelectasis left base. Small left effusion. Possible tiny amount of aspirate material over distal trachea as consider aspiration pneumonia. Partially visualized colon demonstrating wall thickening/ submucosal edema and adjacent inflammatory change suggesting acute colitis. Postsurgical change compatible recent coronary bypass. Partially abdominal aortic stent graft. Stable 1.5 cm hypodensity over the upper pole left kidney likely slightly hyperdense cyst. Left renal cortical atrophy unchanged. Stable mild compression deformity over the lower thoracic spine. Electronically Signed   By: Marin Olp M.D.   On: 06/28/2015 15:32   Ct Abdomen Pelvis W Contrast  07/16/2015  CLINICAL DATA:  Positive for Celsius diff. Generalized abdominal pain with nausea and vomiting. EXAM: CT ABDOMEN AND PELVIS WITH CONTRAST TECHNIQUE: Multidetector CT imaging of the abdomen and pelvis was performed using the standard protocol following bolus administration of intravenous contrast. CONTRAST:  143m ISOVUE-300 IOPAMIDOL (ISOVUE-300) INJECTION 61% COMPARISON:  03/04/2012 FINDINGS: Lower chest: There is bibasilar collapse/consolidation with small bilateral pleural effusions. Heart is markedly enlarged. Hepatobiliary: No focal abnormality within the liver parenchyma. There is no evidence for gallstones, gallbladder wall thickening, or pericholecystic fluid. No intrahepatic or extrahepatic biliary dilation. Pancreas: No focal mass lesion. No dilatation of the main duct. No intraparenchymal cyst. No peripancreatic edema. Spleen: No splenomegaly. No focal mass lesion. Adrenals/Urinary Tract: No adrenal nodule or mass. Kidneys are atrophic, left greater than right with cystic change in the kidneys compatible with end-stage renal disease in this patient  on hemodialysis. Bladder is decompressed. Stomach/Bowel: Stomach is nondistended. No gastric wall thickening. No evidence of outlet obstruction. Duodenum is normally positioned as is the ligament of Treitz. No small bowel wall thickening. No small bowel dilatation. The terminal ileum is normal. The appendix is not visualized. Colon is distended with diffuse irregular and edematous wall thickening throughout. Associated diverticular changes are seen in the sigmoid colon. Vascular/Lymphatic: Patient is status post aortic endograft placement for aneurysm with fem-fem bypass graft without opacifies. Right common iliac artery is occluded. There is no gastrohepatic or hepatoduodenal ligament lymphadenopathy. 11 mm left para-aortic lymph node is seen on image 45 series 2. 9 mm short axis aortocaval lymph node is seen on image 51. No pelvic sidewall lymphadenopathy. Reproductive: The prostate gland and seminal vesicles have normal imaging features. Other: Small to moderate volume intraperitoneal free fluid seen around the liver and spleen with some fluid tracking in the para colic gutters down into the pelvis. Musculoskeletal: Status post left dynamic hip screw placement. Bones are diffusely demineralized. Bone windows reveal no worrisome lytic or sclerotic osseous lesions. IMPRESSION: 1. Diffuse edematous wall thickening, consistent with the reported history of C. difficile infection. 2. Small the moderate bilateral pleural effusions with bilateral lower lobe collapse/consolidation. 3. Small to moderate volume ascites. 4.  Abdominal aortic atherosclerosis. 5. Borderline retroperitoneal lymphadenopathy. 6. Other incidental findings as outlined above. Electronically Signed   By: Misty Stanley M.D.   On: 07/16/2015 12:43   Dg Chest Port 1 View  07/17/2015  CLINICAL DATA:  Post intubation EXAM: PORTABLE CHEST 1 VIEW COMPARISON:  07/16/2015 FINDINGS: Cardiomediastinal silhouette is stable. Endotracheal tube in place with tip  7.8 cm above the carina. NG tube in place with tip in distal stomach. Stable patchy airspace opacification right middle lobe. Trace left pleural effusion with left basilar atelectasis. Small amount of fluid is noted in right minor fissure. There is no pneumothorax. Stable right IJ central line position. Stable dual lumen left IJ central line position. No pneumothorax IMPRESSION: Endotracheal tube in place. No pneumothorax. Small left pleural effusion with left basilar atelectasis. Trace fluid noted in right minor fissure. No pulmonary edema. Stable patchy airspace opacification in right middle lobe. Electronically Signed   By: Lahoma Crocker M.D.   On: 07/17/2015 13:33   Dg Chest Port 1 View  07/15/2015  CLINICAL DATA:  Dyspnea EXAM: PORTABLE CHEST 1 VIEW COMPARISON:  07/14/2015 FINDINGS: Cardiac shadow is stable. A dialysis catheter and right jugular catheter are again seen and stable. Postsurgical changes are again noted. Questionable pleural line is noted laterally on the right. This may be related to an underlying skin fold. Bibasilar atelectatic changes are noted with a small left effusion. No other focal abnormality is noted. Left vascular stenting is identified. IMPRESSION: Stable bibasilar changes. Questionable pleural line laterally on the right. This was not seen on the previous exam and is likely related to a skin fold. Continued follow-up is recommended. These results will be called to the ordering clinician or representative by the Radiologist Assistant, and communication documented in the PACS or zVision Dashboard. Electronically Signed   By: Inez Catalina M.D.   On: 07/15/2015 07:30   Dg Chest Port 1 View  07/14/2015  CLINICAL DATA:  Central line placement EXAM: PORTABLE CHEST 1 VIEW COMPARISON:  07/14/2015 FINDINGS: Cardiomegaly again noted. Left IJ catheter with tip in right atrium is unchanged in position. There is right IJ central line with tip in distal SVC. No pneumothorax. Persistent small left  pleural effusion with left basilar atelectasis or infiltrate. IMPRESSION: Left IJ catheter with tip in right atrium is unchanged in position. There is right IJ central line with tip in distal SVC. No pneumothorax. Persistent small left pleural effusion with left basilar atelectasis or infiltrate. Electronically Signed   By: Lahoma Crocker M.D.   On: 07/14/2015 14:43   Dg Chest Port 1 View  07/14/2015  CLINICAL DATA:  Shortness of breath.  Productive cough. EXAM: PORTABLE CHEST 1 VIEW COMPARISON:  07/13/2015 FINDINGS: Cardiomegaly. Prior CABG. Left dialysis catheter remains in place, unchanged. Left lower lobe atelectasis or infiltrate. Increasing right medial basilar airspace opacity as well. Possible small left effusion. No acute bony abnormality. IMPRESSION: Continued left lower lobe atelectasis or infiltrate. Suspect small left effusion. Increasing right medial basilar airspace opacity. Electronically Signed   By: Rolm Baptise M.D.   On: 07/14/2015 12:23   Dg Chest Portable 1 View  06/22/2015  CLINICAL DATA:  Breathing difficulty/dyspnea. Dialysis patient. Shortness of breath. EXAM: PORTABLE CHEST 1 VIEW COMPARISON:  12/02/2014 FINDINGS: Interval CABG. Dialysis catheter noted with distal tip at the cavoatrial junction. Atherosclerotic aortic arch. Coronary stents. Right axillary clips. Left axillary stent. Linear subsegmental atelectasis or scarring at the left lung base. Borderline enlargement of the cardiopericardial silhouette. IMPRESSION: 1. Borderline  enlargement of the cardiopericardial silhouette but no edema. 2. Interval CABG pre coronary stents noted. 3. Atherosclerotic aortic arch. 4. Left IJ dialysis catheter in place, tip cavoatrial junction. No pneumothorax. 5. No edema. Electronically Signed   By: Van Clines M.D.   On: 07/07/2015 13:15   Dg Abd Portable 2v  07/13/2015  CLINICAL DATA:  Diffuse abd pain, pt states he had heart 3-4 weeks ago EXAM: PORTABLE ABDOMEN - 2 VIEW COMPARISON:   07/02/2015 chest x-ray FINDINGS: Status post median sternotomy. Partially imaged dialysis catheter tips overlie the upper right atrium. There is dense opacity at the left lung base, obscuring the left hemidiaphragm, increased over prior. Contrast is identified within the stomach. Aortic stent graft is present. Bowel gas pattern is nonobstructed. No evidence for free intraperitoneal air. IMPRESSION: New opacification of the left lower lobe of the lung. Nonobstructive bowel gas pattern. Electronically Signed   By: Nolon Nations M.D.   On: 07/13/2015 09:12    Assessment:   Nagee Goates is a 82 y.o. male with complicated course since CABG in March at [facility] now with C diff colitis (severe) and resp failure now intubated. I suspect he has an ileus given his abd distention, tenderness and decreased Bowel sounds.  His abd distention is probably causing some resp compromise as well  Recommendations Severe C diff - WBC has been elevated (was on steroids as well). CT with diffuse wall thickening  -started on oral vanco 6/29 and IV flagyl 6/29.   Will add rectal vanco given the probable ileus  Possible aspiration PNA - would check sputum cx if starts to have sputum but per nurse there are very little secretions on suctioning. - cont IV flagyl but hold on further abx unless pulm status worsens.   Thank you very much for allowing me to participate in the care of this patient. Please call with questions.   Cheral Marker. Ola Spurr, MD

## 2015-07-17 NOTE — Progress Notes (Addendum)
Updated: Called to evaluate patient at bedside with labored breathing and cyanotic color. While planning for intubation, patient had brief loss of pulse, ACLS started, received epi x 2, bicarb x 1.  During intubation significant amount of secretions noted around vocal cords and upper airway.  Possible aspiration event. Spoke with Dr. Leslye Peer, CCM will manage patient.   Plan - cont with cdiff treatment - MV, wean as tolerated - SAT/SBT  - respiratory culture - pressors - levophed - maintain o2 sats >88 % - CXR, AXR, abg - solucortef 50mg  IV q6 hrs - labs now and in the AM.  - CT Head w/o contrast - wife  And son updated - patient is DNR, but with full medical care   CODE STATUS - DNR RN witness - [place] F.  Critical care time - 72 mins  Vilinda Boehringer, MD [place] Pager [phone] (please enter 7-digits) On Call Pager - [phone] (please enter 7-digits)

## 2015-07-17 NOTE — Progress Notes (Signed)
PT Cancellation Note  Patient Details Name: John Robinson MRN: [id number] DOB: 03/11/1932   Cancelled Treatment:    Reason Eval/Treat Not Completed: Medical issues which prohibited therapy   Pt's wife declined session.  Will continue as appropriate.   Chesley Noon 07/17/2015, 11:45 AM

## 2015-07-17 NOTE — Progress Notes (Signed)
Patient ID: John Robinson, male   DOB: Jun 30, 1932, 82 y.o.   MRN: [id number]  Spoke with the wife outside the room and I gave him an update  Likely this was a respiratory arrest. Patient required CPR and intubation. Case discussed with Dr. Stevenson Clinch critical care specialist and he will take over care at this point since the patient is now on the ventilator.  Dr. Leslye Peer

## 2015-07-17 NOTE — Progress Notes (Signed)
Subjective:  Patient with worsening respiratory status this a.m. Abdominal distention noted. Subsequently underwent intubation and now on the ventilator.     Objective:  Vital signs in last 24 hours:  Temp:  [95.8 F (35.4 C)-97.9 F (36.6 C)] 95.8 F (35.4 C) (07/03 0741) Pulse Rate:  [34-113] 100 (07/03 1307) Resp:  [12-30] 25 (07/03 1307) BP: (83-230)/(52-118) 112/57 mmHg (07/03 1306) SpO2:  [54 %-100 %] 100 % (07/03 1307) FiO2 (%):  [21 %] 21 % (07/03 0804)  Weight change:  Filed Weights   07/11/15 1515 07/11/15 1850 07/13/15 1045  Weight: 72.8 kg (160 lb 7.9 oz) 72.3 kg (159 lb 6.3 oz) 71.4 kg (157 lb 6.5 oz)    Intake/Output:    Intake/Output Summary (Last 24 hours) at 07/17/15 1454 Last data filed at 07/17/15 1100  Gross per 24 hour  Intake 934.35 ml  Output      0 ml  Net 934.35 ml     Physical Exam: General: Critically ill appearing  HEENT anicteric  Neck supple  Pulm/lungs B/l crackles, on the vent  CVS/Heart irregular, soft systolic murmur  Abdomen:  Distension noted, diffuse tenderness  Extremities: No peripheral edema  Neurologic: On the ventilator  Skin: B/l UE ecchymoses  Access: Left IJ PermCath       Basic Metabolic Panel:   Recent Labs Lab 07/14/15 0528 07/14/15 1730 07/15/15 0628 07/16/15 0500 07/17/15 0445  NA 139 139 138 138 135  K 4.8 4.5 5.5* 4.9 5.6*  CL 103 104 103 102 103  CO2 26 25 25 23  21*  GLUCOSE 282* 209* 304* 182* 158*  BUN 45* 54* 66* 78* 92*  CREATININE 4.21* 4.84* 5.48* 6.22* 6.86*  CALCIUM 7.8* 7.6* 7.8* 7.8* 7.6*  MG  --  2.1 2.3  --  2.8*  PHOS  --  6.3* 7.1*  --  8.7*     CBC:  Recent Labs Lab 07/13/15 0625 07/14/15 0528 07/14/15 1730 07/15/15 0628 07/16/15 0500 07/17/15 0445  WBC 44.2* 47.2*  --  44.8* 44.2* 30.3*  HGB 14.0 14.2 12.5* 12.8* 13.0 12.6*  HCT 43.9 44.4 39.7* 41.3 39.8* 39.2*  MCV 88.1 87.1  --  89.7 87.0 88.8  PLT 303 194  --  151 112* 77*      Microbiology:  Recent  Results (from the past 720 hour(s))  C difficile quick scan w PCR reflex     Status: Abnormal   Collection Time: 07/03/2015  1:45 AM  Result Value Ref Range Status   C Diff antigen POSITIVE (A) NEGATIVE Final   C Diff toxin POSITIVE (A) NEGATIVE Final   C Diff interpretation   Final    Positive for toxigenic C. difficile, active toxin production present.    Comment: CRITICAL RESULT CALLED TO, READ BACK BY AND VERIFIED WITH:  JACK DOUGHERTY AT [id number] 07/11/15 SDR   Blood culture (routine x 2)     Status: None   Collection Time: 06/16/2015  2:10 PM  Result Value Ref Range Status   Specimen Description BLOOD RIGHT ASSIST CONTROL  Final   Special Requests BOTTLES DRAWN AEROBIC AND ANAEROBIC [place]  Final   Culture NO GROWTH 5 DAYS  Final   Report Status 07/15/2015 FINAL  Final  Blood culture (routine x 2)     Status: None   Collection Time: 07/05/2015  2:10 PM  Result Value Ref Range Status   Specimen Description BLOOD RIGHT HAND  Final   Special Requests   Final  BOTTLES DRAWN AEROBIC AND ANAEROBIC [place]   Culture NO GROWTH 5 DAYS  Final   Report Status 07/15/2015 FINAL  Final  MRSA PCR Screening     Status: Abnormal   Collection Time: 07/11/15  7:06 PM  Result Value Ref Range Status   MRSA by PCR POSITIVE (A) NEGATIVE Final    Comment:        The GeneXpert MRSA Assay (FDA approved for NASAL specimens only), is one component of a comprehensive MRSA colonization surveillance program. It is not intended to diagnose MRSA infection nor to guide or monitor treatment for MRSA infections. READ BACK AND VERIFIED BY MARCELLA TURNER AT 2120 07/11/2015.  TFK     Coagulation Studies:  Recent Labs  07/15/15 0628 07/16/15 0500 07/17/15 0445  LABPROT 34.0* 32.8* 38.3*  INR 3.45 3.29 4.04*    Urinalysis: No results for input(s): COLORURINE, LABSPEC, PHURINE, GLUCOSEU, HGBUR, BILIRUBINUR, KETONESUR, PROTEINUR, UROBILINOGEN, NITRITE, LEUKOCYTESUR in the last 72  hours.  Invalid input(s): APPERANCEUR    Imaging: Dg Chest 1 View  07/16/2015  CLINICAL DATA:  Dyspnea. EXAM: CHEST 1 VIEW COMPARISON:  07/15/2015 FINDINGS: Dense consolidation in the left base. Patchy opacities in the central lung regions. Bilateral knee jugular venous catheters appear satisfactorily positioned, extending into the right atrium. Pulmonary vasculature is normal. IMPRESSION: Unchanged left base consolidation and mild central perihilar airspace opacities. This could represent infectious infiltrate. Electronically Signed   By: Andreas Newport M.D.   On: 07/16/2015 05:34   Dg Abd 1 View  07/17/2015  CLINICAL DATA:  Post OG tube placement EXAM: ABDOMEN - 1 VIEW COMPARISON:  CT 07/16/2015 FINDINGS: Enteric tube has been placed with the tip in the distal stomach. Mild gaseous distention of the stomach. Prior stent graft repair of abdominal aortic aneurysm. Gas throughout mildly prominent large and small bowel loops. IMPRESSION: NG tube tip in the distal stomach. Electronically Signed   By: Rolm Baptise M.D.   On: 07/17/2015 13:29   Dg Abd 1 View  07/16/2015  CLINICAL DATA:  Vomiting. EXAM: ABDOMEN - 1 VIEW COMPARISON:  None FINDINGS: Extensive endoluminal stent graft in the aorta and left common iliac regions. Right external iliac stent. The abdominal gas pattern is negative for obstruction or perforation. No biliary or urinary calculi are evident. IMPRESSION: Negative for obstruction or perforation. Electronically Signed   By: Andreas Newport M.D.   On: 07/16/2015 05:32   Ct Head Wo Contrast  07/17/2015  CLINICAL DATA:  Postop cardiac arrest EXAM: CT HEAD WITHOUT CONTRAST TECHNIQUE: Contiguous axial images were obtained from the base of the skull through the vertex without intravenous contrast. COMPARISON:  11/28/2013 FINDINGS: Brain: No intracranial hemorrhage, mass effect or midline shift. Stable cerebral atrophy. Stable mild periventricular chronic white matter disease. No definite acute  cortical infarction. No mass lesion is noted on this unenhanced scan. Ventricular size is stable from prior exam. Vascular: Mild atherosclerotic calcifications of carotid siphon. Skull: No skull fracture is noted. Sinuses/Orbits: Paranasal sinuses are unremarkable. Stable postsurgical changes left mastoid. Some fluid in left mastoid air cells again noted. Other: None IMPRESSION: No acute intracranial abnormality. No definite acute cortical infarction. Stable atrophy and chronic white matter disease. Electronically Signed   By: Lahoma Crocker M.D.   On: 07/17/2015 14:05   Ct Abdomen Pelvis W Contrast  07/16/2015  CLINICAL DATA:  Positive for Celsius diff. Generalized abdominal pain with nausea and vomiting. EXAM: CT ABDOMEN AND PELVIS WITH CONTRAST TECHNIQUE: Multidetector CT imaging of the abdomen and pelvis  was performed using the standard protocol following bolus administration of intravenous contrast. CONTRAST:  125mL ISOVUE-300 IOPAMIDOL (ISOVUE-300) INJECTION 61% COMPARISON:  03/04/2012 FINDINGS: Lower chest: There is bibasilar collapse/consolidation with small bilateral pleural effusions. Heart is markedly enlarged. Hepatobiliary: No focal abnormality within the liver parenchyma. There is no evidence for gallstones, gallbladder wall thickening, or pericholecystic fluid. No intrahepatic or extrahepatic biliary dilation. Pancreas: No focal mass lesion. No dilatation of the main duct. No intraparenchymal cyst. No peripancreatic edema. Spleen: No splenomegaly. No focal mass lesion. Adrenals/Urinary Tract: No adrenal nodule or mass. Kidneys are atrophic, left greater than right with cystic change in the kidneys compatible with end-stage renal disease in this patient on hemodialysis. Bladder is decompressed. Stomach/Bowel: Stomach is nondistended. No gastric wall thickening. No evidence of outlet obstruction. Duodenum is normally positioned as is the ligament of Treitz. No small bowel wall thickening. No small bowel  dilatation. The terminal ileum is normal. The appendix is not visualized. Colon is distended with diffuse irregular and edematous wall thickening throughout. Associated diverticular changes are seen in the sigmoid colon. Vascular/Lymphatic: Patient is status post aortic endograft placement for aneurysm with fem-fem bypass graft without opacifies. Right common iliac artery is occluded. There is no gastrohepatic or hepatoduodenal ligament lymphadenopathy. 11 mm left para-aortic lymph node is seen on image 45 series 2. 9 mm short axis aortocaval lymph node is seen on image 51. No pelvic sidewall lymphadenopathy. Reproductive: The prostate gland and seminal vesicles have normal imaging features. Other: Small to moderate volume intraperitoneal free fluid seen around the liver and spleen with some fluid tracking in the para colic gutters down into the pelvis. Musculoskeletal: Status post left dynamic hip screw placement. Bones are diffusely demineralized. Bone windows reveal no worrisome lytic or sclerotic osseous lesions. IMPRESSION: 1. Diffuse edematous wall thickening, consistent with the reported history of C. difficile infection. 2. Small the moderate bilateral pleural effusions with bilateral lower lobe collapse/consolidation. 3. Small to moderate volume ascites. 4. Abdominal aortic atherosclerosis. 5. Borderline retroperitoneal lymphadenopathy. 6. Other incidental findings as outlined above. Electronically Signed   By: Misty Stanley M.D.   On: 07/16/2015 12:43   Dg Chest Port 1 View  07/17/2015  CLINICAL DATA:  Post intubation EXAM: PORTABLE CHEST 1 VIEW COMPARISON:  07/16/2015 FINDINGS: Cardiomediastinal silhouette is stable. Endotracheal tube in place with tip 7.8 cm above the carina. NG tube in place with tip in distal stomach. Stable patchy airspace opacification right middle lobe. Trace left pleural effusion with left basilar atelectasis. Small amount of fluid is noted in right minor fissure. There is no  pneumothorax. Stable right IJ central line position. Stable dual lumen left IJ central line position. No pneumothorax IMPRESSION: Endotracheal tube in place. No pneumothorax. Small left pleural effusion with left basilar atelectasis. Trace fluid noted in right minor fissure. No pulmonary edema. Stable patchy airspace opacification in right middle lobe. Electronically Signed   By: Lahoma Crocker M.D.   On: 07/17/2015 13:33     Medications:   . DOPamine 2.5 mcg/kg/min (07/17/15 0900)  . fentaNYL infusion INTRAVENOUS    . norepinephrine     . acidophilus  1 capsule Oral BID  . antiseptic oral rinse  7 mL Mouth Rinse BID  . atorvastatin  80 mg Oral QHS  . benzonatate  200 mg Oral TID  . budesonide (PULMICORT) nebulizer solution  0.25 mg Nebulization BID  . famotidine (PEPCID) IV  20 mg Intravenous Q24H  . feeding supplement (ENSURE ENLIVE)  237 mL Oral TID  .  fentaNYL      . fentaNYL (SUBLIMAZE) injection  50 mcg Intravenous Once  . fentaNYL (SUBLIMAZE) injection  50 mcg Intravenous Once  . hydrocortisone sod succinate (SOLU-CORTEF) inj  50 mg Intravenous Q6H  . insulin aspart  0-20 Units Subcutaneous TID WC  . insulin glargine  20 Units Subcutaneous QHS  . ipratropium-albuterol  3 mL Nebulization Q4H  . lidocaine (PF)  5 mL Intradermal Once  . LORazepam  0.5 mg Intravenous Once  . LORazepam  0.25 mg Oral Once per day on Tue Thu Sat  . magic mouthwash  5 mL Oral TID  . metronidazole  500 mg Intravenous Q8H  . midazolam  4 mg Intravenous Once  . norepinephrine      . ondansetron (ZOFRAN) IV  4 mg Intravenous Q6H  . rocuronium      . rocuronium  50 mg Intravenous Once  . sodium chloride flush  3 mL Intravenous Q12H  . vancomycin  500 mg Oral Q6H  . Warfarin - Pharmacist Dosing Inpatient   Does not apply q1800   acetaminophen **OR** acetaminophen, fentaNYL, gi cocktail, ipratropium-albuterol, lidocaine-prilocaine, metoCLOPramide (REGLAN) injection, midazolam, midazolam, morphine injection,  nitroGLYCERIN, ondansetron (ZOFRAN) IV, traMADol  Assessment/ Plan:  82 y.o. male with end-stage renal disease, hemodialysis [address], Tuesday, Thursday, Saturday, history of severe vascular disease including aortic-iliac bypass,four-vessel CABG at [facility] March 2017, postop wound infection, coronary disease, history of RCA stent, COPD, C diff infection and acute respiratory failure 06/2015  1. End-stage renal disease.  TuesdayThursday/Saturday.  [place] patient critically ill now.  No urgent indication for dialysis.  If remains hemodynamically unstable tomorrow may need to consider continuous renal replacement therapy.  2. Anemia of chronic kidney disease. Hemoglobin currently well 0.6.  Hold off on Epogen for now.  3.  Supratherapeutic INR:  INR currently 4.04.  Plan per upon the critical care.  4. Sepsis   onsider infectious disease consultation.  Currently on vancomycin oral for C. Difficile infection.  5.  Secondary hyperparathyroidism Most recent phosphorus was 7.1.  We will need to continue to monitor.  6. C. Difficile colitis Currently metronidazole IV, vancomycin oral  7.  Acute respiratory failure.  The patient has been transitioned to the ventilator.  Significant amount secretions noted in the upper airway.   LOS: 7 Bobbi Yount 7/3/20172:54 PM

## 2015-07-17 NOTE — Progress Notes (Signed)
Spoke with eLink MD about Pt's K of 7.1 and Troponin of .67. Awaiting new orders. Will continue to monitor

## 2015-07-17 NOTE — Care Management (Signed)
Discussed during progression that patient is a full code and may benefit from palliative care consult for goals of treatment.  It appeared at that time he was having difficulty with respirations.  Patient has since been intubated.

## 2015-07-17 NOTE — Progress Notes (Signed)
OT Cancellation Note  Patient Details Name: John Robinson MRN: [id number] DOB: 05-Dec-1932   Cancelled Treatment:    Reason Eval/Treat Not Completed: Medical issues which prohibited therapy Per chart review, a code blue was called and Dr Leslye Peer indicated pt likely had a respiratory arrest, required CPR and intubated and on ventilator. Will hold OT evaluation and re-assess when medically stable.  Thank you for the referral.  Chrys Racer, OTR/L ascom [phone] 07/17/2015, 1:35 PM

## 2015-07-17 NOTE — Consult Note (Addendum)
Pharmacy Antibiotic Note  John Robinson is a 82 y.o. male admitted on 06/17/2015 with pneumonia. MD notes dysphagia and concern for aspiration PNA. Pharmacy has been consulted for C diff management. Hemodialysis patient.   Plan: Will continue current orders for Metronidazole 500mg  IV Q8hr and Vancomycin 500 mg PO q6h. Patient is currently on day 7 of therapy. Will f/u duration of therapy with planned duration of 10 days.   Height: 6' (182.9 cm) Weight: 157 lb 6.5 oz (71.4 kg) IBW/kg (Calculated) : 77.6  Temp (24hrs), Avg:97.7 F (36.5 C), Min:95.8 F (35.4 C), Max:99.3 F (37.4 C)   Recent Labs Lab 06/22/2015 1530 07/05/2015 1924  07/13/15 0625 07/14/15 0528 07/14/15 1730 07/15/15 0628 07/16/15 0500 07/17/15 0445  WBC  --   --   < > 44.2* 47.2*  --  44.8* 44.2* 30.3*  CREATININE  --   --   < > 6.04* 4.21* 4.84* 5.48* 6.22* 6.86*  LATICACIDVEN 1.7 1.6  --   --   --  2.2*  --   --   --   < > = values in this interval not displayed.  Estimated Creatinine Clearance: 8.5 mL/min (by C-G formula based on Cr of 6.86).    Allergies  Allergen Reactions  . Pletal [Cilostazol] Other (See Comments)    Reaction:  Dizziness    Antimicrobials this admission: vancomycin 6/26 >> 6/27 Azithromycin 6/26 >> 6/27 cefepime 6/26 >> 6/28 Metronidazole 6/27 >> Vancomycin PO 6/27 >>   Dose adjustments this admission:  Microbiology results: 6/26 BCx: NGTD 6/26 cdiff: positive  6/27 Sputum Cx: pending  6/27 MRSA PCR: positive  Pharmacy will continue to monitor and adjust per consult.    Ulice Dash, PharmD Clinical Pharmacist   07/17/2015 9:49 AM

## 2015-07-17 NOTE — Procedures (Signed)
Procedure Note:  Orotracheal Intubation  Implied consent due to emergent nature of patient's condition. Correct Patient, Name & ID confirmed.  The patient was pre-oxygenated and then, under direct visualization, a 7.5 mm cuffed endotracheal tube was placed through the vocal cords into the trachea, using the Glidescope.  Total attempts made 1, using Glidescope, thick, white, significant secretion noted around vocal cords and flowing into trachea.  During intubation an assistant applied gentle pressure to the cricoid cartilage.  Position confirmed by auscultation of lungs (good breath sounds bilaterally) and no stomach sounds.  Tube secured at 23 cm. Pulse ox 96 %. CO2 detector in place with appropriate color change.   Pt tolerated procedure well.  No complications were noted.   CXR ordered.   Vilinda Boehringer, MD [facility] Pager [phone] On Call Pager [phone]

## 2015-07-17 NOTE — Progress Notes (Signed)
Patient ID: John Robinson, male   DOB: [birth year], 82 y.o.   MRN: [id number] Patient ID: John Robinson, male   DOB: 1932/02/07, 82 y.o.   MRN: [id number] [employer] PROGRESS NOTE  John Robinson [id number] DOB: 13-Nov-1932 DOA: 07/06/2015 PCP: Lelon Huh, MD  HPI/Subjective: Patient with agonal breathing and in respiratory distress.  Since patient a full code, its time to place him on the ventilator.  Wife at bedside.  Objective: Filed Vitals:   07/17/15 0930 07/17/15 1000  BP: 102/63 91/60  Pulse:    Temp:    Resp: 13 13    Filed Weights   07/11/15 1515 07/11/15 1850 07/13/15 1045  Weight: 72.8 kg (160 lb 7.9 oz) 72.3 kg (159 lb 6.3 oz) 71.4 kg (157 lb 6.5 oz)    ROS: Review of Systems  Unable to perform ROS  Exam: Physical Exam  Constitutional: He appears lethargic.  HENT:  Nose: No mucosal edema.  Mouth/Throat: No oropharyngeal exudate or posterior oropharyngeal edema.  Eyes: Conjunctivae, EOM and lids are normal. Pupils are equal, round, and reactive to light.  Neck: No JVD present. Carotid bruit is not present. No edema present. No thyroid mass and no thyromegaly present.  Cardiovascular: S1 normal and S2 normal.  Exam reveals no gallop.   No murmur heard. Pulses:      Dorsalis pedis pulses are 2+ on the right side, and 2+ on the left side.  Respiratory: Accessory muscle usage present. He is in respiratory distress. He has decreased breath sounds in the right middle field, the right lower field, the left middle field and the left lower field. He has no wheezes. He has no rhonchi. He has rales in the right middle field, the right lower field, the left middle field and the left lower field.  GI: Bowel sounds are normal. He exhibits distension. There is generalized tenderness.  Musculoskeletal:       Right ankle: He exhibits no swelling.       Left ankle: He exhibits no swelling.  Lymphadenopathy:    He has no cervical adenopathy.  Neurological: He  appears lethargic.  Skin: Skin is warm. Nails show no clubbing.  Bruising upper and lower extremities.      Data Reviewed: Basic Metabolic Panel:  Recent Labs Lab 07/14/15 0528 07/14/15 1730 07/15/15 0628 07/16/15 0500 07/17/15 0445  NA 139 139 138 138 135  K 4.8 4.5 5.5* 4.9 5.6*  CL 103 104 103 102 103  CO2 26 25 25 23  21*  GLUCOSE 282* 209* 304* 182* 158*  BUN 45* 54* 66* 78* 92*  CREATININE 4.21* 4.84* 5.48* 6.22* 6.86*  CALCIUM 7.8* 7.6* 7.8* 7.8* 7.6*  MG  --  2.1 2.3  --  2.8*  PHOS  --  6.3* 7.1*  --  8.7*   Liver Function Tests:  Recent Labs Lab 07/07/2015 1240  AST 17  ALT 17  ALKPHOS 91  BILITOT 0.5  PROT 5.9*  ALBUMIN 2.5*   CBC:  Recent Labs Lab 07/08/2015 1240  07/13/15 0625 07/14/15 0528 07/14/15 1730 07/15/15 0628 07/16/15 0500 07/17/15 0445  WBC 24.7*  < > 44.2* 47.2*  --  44.8* 44.2* 30.3*  NEUTROABS 21.2*  --   --   --   --   --   --   --   HGB 11.8*  < > 14.0 14.2 12.5* 12.8* 13.0 12.6*  HCT 35.3*  < > 43.9 44.4 39.7* 41.3 39.8* 39.2*  MCV 85.5  < >  88.1 87.1  --  89.7 87.0 88.8  PLT 269  < > 303 194  --  151 112* 77*  < > = values in this interval not displayed. Cardiac Enzymes:  Recent Labs Lab 07/16/15 1030 07/16/15 1618 07/16/15 2340 07/17/15 0445 07/17/15 1045  TROPONINI 0.11* 0.11* 0.12* 0.13* 0.14*   BNP (last 3 results)  Recent Labs  07/02/2015 1240  BNP 227.0*     CBG:  Recent Labs Lab 07/16/15 1152 07/16/15 1716 07/16/15 2112 07/17/15 0737 07/17/15 1129  GLUCAP 117* 167* 160* 126* 99    Recent Results (from the past 240 hour(s))  C difficile quick scan w PCR reflex     Status: Abnormal   Collection Time: 07/01/2015  1:45 AM  Result Value Ref Range Status   C Diff antigen POSITIVE (A) NEGATIVE Final   C Diff toxin POSITIVE (A) NEGATIVE Final   C Diff interpretation   Final    Positive for toxigenic C. difficile, active toxin production present.    Comment: CRITICAL RESULT CALLED TO, READ BACK BY AND  VERIFIED WITH:  JACK DOUGHERTY AT 1458 07/11/15 SDR   Blood culture (routine x 2)     Status: None   Collection Time: 07/03/2015  2:10 PM  Result Value Ref Range Status   Specimen Description BLOOD RIGHT ASSIST CONTROL  Final   Special Requests BOTTLES DRAWN AEROBIC AND ANAEROBIC [place]  Final   Culture NO GROWTH 5 DAYS  Final   Report Status 07/15/2015 FINAL  Final  Blood culture (routine x 2)     Status: None   Collection Time: 07/02/2015  2:10 PM  Result Value Ref Range Status   Specimen Description BLOOD RIGHT HAND  Final   Special Requests   Final    BOTTLES DRAWN AEROBIC AND ANAEROBIC [place]   Culture NO GROWTH 5 DAYS  Final   Report Status 07/15/2015 FINAL  Final  MRSA PCR Screening     Status: Abnormal   Collection Time: 07/11/15  7:06 PM  Result Value Ref Range Status   MRSA by PCR POSITIVE (A) NEGATIVE Final    Comment:        The GeneXpert MRSA Assay (FDA approved for NASAL specimens only), is one component of a comprehensive MRSA colonization surveillance program. It is not intended to diagnose MRSA infection nor to guide or monitor treatment for MRSA infections. READ BACK AND VERIFIED BY MARCELLA TURNER AT 2120 07/11/2015.  TFK      Scheduled Meds: . acidophilus  1 capsule Oral BID  . antiseptic oral rinse  7 mL Mouth Rinse BID  . atorvastatin  80 mg Oral QHS  . benzonatate  200 mg Oral TID  . budesonide (PULMICORT) nebulizer solution  0.25 mg Nebulization BID  . famotidine (PEPCID) IV  20 mg Intravenous Q24H  . feeding supplement (ENSURE ENLIVE)  237 mL Oral TID  . fentaNYL      . insulin aspart  0-20 Units Subcutaneous TID WC  . insulin glargine  20 Units Subcutaneous QHS  . ipratropium-albuterol  3 mL Nebulization BID  . lidocaine (PF)  5 mL Intradermal Once  . LORazepam  0.5 mg Intravenous Once  . LORazepam  0.25 mg Oral Once per day on Tue Thu Sat  . magic mouthwash  5 mL Oral TID  . methylPREDNISolone (SOLU-MEDROL) injection  20 mg  Intravenous Q24H  . metronidazole  500 mg Intravenous Q8H  . midazolam      . ondansetron (ZOFRAN) IV  4 mg Intravenous Q6H  . rocuronium      . sodium chloride flush  3 mL Intravenous Q12H  . vancomycin  500 mg Oral Q6H  . Warfarin - Pharmacist Dosing Inpatient   Does not apply q1800    Assessment/Plan:  1. Acute respiratory failure with agonal-type breathing now. I spoke with critical care team to intubate and they will take over service. Patient will require intubation to survive. Spoke with the wife at the bedside. Chest x-ray postintubation to determine whether this is fluid. 2. Clinical sepsis present on admission. C. difficile colitis. Continue vancomycin orally and Flagyl IV. Continue standing dose Zofran. Will get ID consult for today for consideration of dificid. CT the abdomen and pelvis yesterday showed inflammation throughout the colon. 3. Shock- dopamine drip 4. End-stage renal disease on dialysis Tuesday Thursday Saturday 4.   History of coronary artery disease with borderline troponin. Continue aspirin. Troponin elevation secondary to demand ischemia. 5.   COPD exacerbation. Solu-Medrol continue budesonide and and nebulizers 6.   Dysphagia with silent aspiration speech therapy following. If there is an aspiration Flagyl should have some coverage. 7.   Stage II decubiti buttock present on admission 8.   Chronic chest wound. Seems to be healing well.  10. Atrial fibrillation. INR still therapuetic.  Code Status:     Code Status Orders        Start     Ordered   07/11/2015 1602  Full code   Continuous     06/26/2015 1601    Code Status History    Date Active Date Inactive Code Status Order ID Comments User Context   This patient has a current code status but no historical code status.     Family Communication: Wife yesterday Disposition Plan: To be determined.   Consultants:  Nephrology  Critical care specialist  Antibiotics:  Oral vancomycin  IV  Flagyl  Time spent: 35 minutes critical care time  [place], [place]

## 2015-07-17 NOTE — Progress Notes (Signed)
RT called to room for elective intubation of patient due to patient's difficulty breathing.  Upon patient being positioned for intubation, patient required CPR.  Patient intubated with 7.5 ETT on 1st attempt by Dr. Stevenson Clinch.  Foamy secretions suctioned from back of throat during intubation.  Patient placed on ventilator and patient no longer requiring CPR.

## 2015-07-17 NOTE — Progress Notes (Signed)
[place], NP informed in person about lactic acid value of 6.2. Due to dialysis status of patient, NP ordered RN to obtain CVP reading.

## 2015-07-17 NOTE — Progress Notes (Signed)
Informed [place] NP of CVP value of 9 and patient's temperature of 34.5 degrees C with esophageal probe (unable to get temperature axillary or orally) after having lower temperatures all day. NP to place order for bolus (250 mL) and ordered [facility] for patient.

## 2015-07-17 NOTE — Progress Notes (Signed)
[facility] Progess Note    ASSESSMENT/PLAN   82 year old male severe debility, ESRD and deconditioning, status post long hospital admission for CABG, complicated by sternal wound infection. Went home one week ago after 3 months of being in medical facilities, returned with C. difficile with sepsis.  PULMONARY A: Acute hypoxic respiratory failure Severe, COPD/emphysema. Left lower lobe infiltrate and perihilar congestion-No significant change from Chest x-ray images from 6/30.  P:   -Continue to monitor respiratory status closely. -Supplemental O2 [place] prn  CARDIOVASCULAR A:  Hypotension 2/2 to septic shock from C. difficile colitis-SBP still fluctuating in the low 90s.   H/O CAD S/P CABG New onset Afib on telemetry-EKG 07/02 showed AFIB with RVR with HR of 109; remains in Afib; INR supratherapeutic ; last echo 4/12-EF >55% P:  -Hemodynamics per ICU -Continue IV fluids and dopamine (tolerating well)  RENAL A:   ESRD on hemodialysis P:   -Continue HD per nephro.  -May need higher dose pressors for dialysis.  GASTROINTESTINAL A:   C difficile colitis Nausea and vomiting-likely 2/2 medication side effects or acute exacerbation of GERD, abdominal x-ray  negative  H/O GERD P:   -Continue PO vancomycin and iv flagyl.  May require dificil if status worsens, will await ID input.  -Zofran and Reglan prn -Pepcid 20 mg iv bid  HEMATOLOGIC A:  H/O Severe peripheral vascular disease-INR supratherapeutic P:  -Hold Coumadin today -PT/INR daily  INFECTIOUS A:   Leukocytosis with increasing white cell count, due to C. difficile with sepsis and steroids-procalcitonin >2. P:   -Continue Flagyl and oral vancomycin for C. difficile sepsis. -Check pro calcitonin and monitor for fever. -f/u cultures  CULTURES: 6/26 Blood culture>> negative as of 7/1 6/26 c-diff> Positive 6/27: MRSA screening PCR positive  Pro-calcitonin 2.3>>2.42 07/15/14>2.03  7/2  ANTIBIOTICS vancomycin 6/26 >> 6/27 Azithromycin 6/26 >> 6/27 cefepime 6/26 >> 6/28 metronidazole 6/27 >> Vancomycin PO 6/27 >>    ENDOCRINE A:  Hyperglycemia-steroid induced  P:   Continue Levemir to 20 units daily at bedtime, and continue NovoLog correction scale.   MAJOR EVENTS/TEST RESULTS:   Best Practices  DVT Prophylaxis: scd GI Prophylaxis: --   ---------------------------------------   ----------------------------------------   Name: John Robinson MRN: [id number] DOB: 1932/01/31    ADMISSION DATE:  06/27/2015   SUBJECTIVE: C/O abdominal pain and mild nausea. Relieved with reglan and morphine.  Review of Systems:  Constitutional: Feels well. Cardiovascular: No chest pain.  Pulmonary: Denies dyspnea.   GI: Reports mild abdominal pain, nausea and vomiting The remainder of systems were reviewed and were found to be negative other than what is documented in the HPI.    VITAL SIGNS: Temp:  [95.8 F (35.4 C)-99.3 F (37.4 C)] 95.8 F (35.4 C) (07/03 0741) Pulse Rate:  [82-111] 89 (07/03 0845) Resp:  [12-24] 15 (07/03 0845) BP: (84-128)/(52-93) 91/52 mmHg (07/03 0845) SpO2:  [93 %-100 %] 100 % (07/03 0845) HEMODYNAMICS:   VENTILATOR SETTINGS:   INTAKE / OUTPUT:  Intake/Output Summary (Last 24 hours) at 07/17/15 0846 Last data filed at 07/17/15 0730  Gross per 24 hour  Intake 1099.82 ml  Output     30 ml  Net 1069.82 ml    PHYSICAL EXAMINATION: Physical Examination:   VS: BP 91/52 mmHg  Pulse 89  Temp(Src) 95.8 F (35.4 C) (Oral)  Resp 15  Ht 6' (1.829 m)  Wt 157 lb 6.5 oz (71.4 kg)  BMI 21.34 kg/m2  SpO2 100%  General Appearance: Sleeping  Neuro: AAO X3, no focal deficits HEENT: PERRLA, EOM intact. Pulmonary: Bilateral airflow, improved rhonchi bilaterally Cardiovascular: NSR, S1,S2.  No m/r/g.   Abdomen:Non-distended, normal bowel sounds, diffuse pain with gentle palpation Renal:  No costovertebral tenderness   Endocrine: No evident thyromegaly. Skin:   warm, no rashes, no ecchymosis  Extremities: Venous stasis discoloration bilaterally  LABS:   LABORATORY PANEL:   CBC  Recent Labs Lab 07/17/15 0445  WBC 30.3*  HGB 12.6*  HCT 39.2*  PLT 77*    Chemistries   Recent Labs Lab 07/07/2015 1240  07/17/15 0445  NA 137  < > 135  K 4.1  < > 5.6*  CL 103  < > 103  CO2 21*  < > 21*  GLUCOSE 214*  < > 158*  BUN 101*  < > 92*  CREATININE 6.88*  < > 6.86*  CALCIUM 8.5*  < > 7.6*  MG  --   < > 2.8*  PHOS  --   < > 8.7*  AST 17  --   --   ALT 17  --   --   ALKPHOS 91  --   --   BILITOT 0.5  --   --   < > = values in this interval not displayed.   Recent Labs Lab 07/15/15 2134 07/16/15 0659 07/16/15 1152 07/16/15 1716 07/16/15 2112 07/17/15 0737  GLUCAP 158* 175* 117* 167* 160* 126*    Recent Labs Lab 07/14/15 1800  PHART 7.32*  PCO2ART 48    Recent Labs Lab 06/29/2015 1240  AST 17  ALT 17  ALKPHOS 91  BILITOT 0.5  ALBUMIN 2.5*    Cardiac Enzymes  Recent Labs Lab 07/17/15 0445  TROPONINI 0.13*    RADIOLOGY:  Dg Chest 1 View  07/16/2015  CLINICAL DATA:  Dyspnea. EXAM: CHEST 1 VIEW COMPARISON:  07/15/2015 FINDINGS: Dense consolidation in the left base. Patchy opacities in the central lung regions. Bilateral knee jugular venous catheters appear satisfactorily positioned, extending into the right atrium. Pulmonary vasculature is normal. IMPRESSION: Unchanged left base consolidation and mild central perihilar airspace opacities. This could represent infectious infiltrate. Electronically Signed   By: Andreas Newport M.D.   On: 07/16/2015 05:34   Dg Abd 1 View  07/16/2015  CLINICAL DATA:  Vomiting. EXAM: ABDOMEN - 1 VIEW COMPARISON:  None FINDINGS: Extensive endoluminal stent graft in the aorta and left common iliac regions. Right external iliac stent. The abdominal gas pattern is negative for obstruction or perforation. No biliary or urinary calculi are evident.  IMPRESSION: Negative for obstruction or perforation. Electronically Signed   By: Andreas Newport M.D.   On: 07/16/2015 05:32   Ct Abdomen Pelvis W Contrast  07/16/2015  CLINICAL DATA:  Positive for Celsius diff. Generalized abdominal pain with nausea and vomiting. EXAM: CT ABDOMEN AND PELVIS WITH CONTRAST TECHNIQUE: Multidetector CT imaging of the abdomen and pelvis was performed using the standard protocol following bolus administration of intravenous contrast. CONTRAST:  132mL ISOVUE-300 IOPAMIDOL (ISOVUE-300) INJECTION 61% COMPARISON:  03/04/2012 FINDINGS: Lower chest: There is bibasilar collapse/consolidation with small bilateral pleural effusions. Heart is markedly enlarged. Hepatobiliary: No focal abnormality within the liver parenchyma. There is no evidence for gallstones, gallbladder wall thickening, or pericholecystic fluid. No intrahepatic or extrahepatic biliary dilation. Pancreas: No focal mass lesion. No dilatation of the main duct. No intraparenchymal cyst. No peripancreatic edema. Spleen: No splenomegaly. No focal mass lesion. Adrenals/Urinary Tract: No adrenal nodule or mass. Kidneys are atrophic, left greater than right with cystic  change in the kidneys compatible with end-stage renal disease in this patient on hemodialysis. Bladder is decompressed. Stomach/Bowel: Stomach is nondistended. No gastric wall thickening. No evidence of outlet obstruction. Duodenum is normally positioned as is the ligament of Treitz. No small bowel wall thickening. No small bowel dilatation. The terminal ileum is normal. The appendix is not visualized. Colon is distended with diffuse irregular and edematous wall thickening throughout. Associated diverticular changes are seen in the sigmoid colon. Vascular/Lymphatic: Patient is status post aortic endograft placement for aneurysm with fem-fem bypass graft without opacifies. Right common iliac artery is occluded. There is no gastrohepatic or hepatoduodenal ligament  lymphadenopathy. 11 mm left para-aortic lymph node is seen on image 45 series 2. 9 mm short axis aortocaval lymph node is seen on image 51. No pelvic sidewall lymphadenopathy. Reproductive: The prostate gland and seminal vesicles have normal imaging features. Other: Small to moderate volume intraperitoneal free fluid seen around the liver and spleen with some fluid tracking in the para colic gutters down into the pelvis. Musculoskeletal: Status post left dynamic hip screw placement. Bones are diffusely demineralized. Bone windows reveal no worrisome lytic or sclerotic osseous lesions. IMPRESSION: 1. Diffuse edematous wall thickening, consistent with the reported history of C. difficile infection. 2. Small the moderate bilateral pleural effusions with bilateral lower lobe collapse/consolidation. 3. Small to moderate volume ascites. 4. Abdominal aortic atherosclerosis. 5. Borderline retroperitoneal lymphadenopathy. 6. Other incidental findings as outlined above. Electronically Signed   By: Misty Stanley M.D.   On: 07/16/2015 12:43     Total CCM time: 35 minutes  [facility] ANP-BC Pulmonary and [place] Pager [phone] or [phone]   07/17/2015   STAFF NOTE: I, Dr. Vilinda Boehringer have personally reviewed patient's available data, including medical history, events of note, physical examination and test results as part of my evaluation. I have discussed with NP Patria Mane and other care providers such as pharmacist, RN and RRT.    A:82 year old male severe debility, ESRD and deconditioning, status post long hospital admission for CABG, complicated by sternal wound infection. Went home one week ago after 3 months of being in medical facilities, returned with C. difficile with sepsis.  Sepsis Cdiff Respiratory distress Atelectasis Hypotension  Mild troponin elevation Leukocytosis - improving thrombocytopenia   P:   - cont with vanc and flagyl, consider  dificil pending ID input - on low dose dopamine, tolerating well - concerns for silent aspiration, per speech eval, these could be causing findings (cosolidated areas) on the CT.  Recommend puree diet, however, patient refusing speech recommendations. - troponin elevation, 2/2 to sepsis, demand ischemia - monitor plts/cbc   .  Rest per NP/medical resident whose note is outlined above and that I agree with  The patient is critically ill with multiple organ systems failure and requires high complexity decision making for assessment and support, frequent evaluation and titration of therapies, application of advanced monitoring technologies and extensive interpretation of multiple databases.   Critical Care Time devoted to patient care services described in this note is  35 Minutes.   This time reflects time of care of this signee Dr Vilinda Boehringer.  This critical care time does not reflect procedure time, or teaching time or supervisory time of PA/NP/Med-student/Med Resident etc but could involve care discussion time.  Vilinda Boehringer, MD [place] Pager [phone] (please enter 7-digits) On Call Pager - [phone] (please enter 7-digits)  Note: This note was prepared  with Dragon dictation along with smaller phrase technology. Any transcriptional errors that result from this process are unintentional.

## 2015-07-18 ENCOUNTER — Inpatient Hospital Stay: Payer: Commercial Managed Care - HMO

## 2015-07-18 DIAGNOSIS — R5381 Other malaise: Secondary | ICD-10-CM

## 2015-07-18 DIAGNOSIS — R400 Somnolence: Secondary | ICD-10-CM

## 2015-07-18 DIAGNOSIS — J9621 Acute and chronic respiratory failure with hypoxia: Secondary | ICD-10-CM

## 2015-07-18 LAB — CBC WITH DIFFERENTIAL/PLATELET
BASOS ABS: 0 10*3/uL (ref 0–0.1)
Basophils Relative: 0 %
EOS ABS: 0 10*3/uL (ref 0–0.7)
Eosinophils Relative: 0 %
HEMATOCRIT: 37.7 % — AB (ref 40.0–52.0)
Hemoglobin: 12.1 g/dL — ABNORMAL LOW (ref 13.0–18.0)
LYMPHS PCT: 3 %
Lymphs Abs: 0.9 10*3/uL — ABNORMAL LOW (ref 1.0–3.6)
MCH: 28.5 pg (ref 26.0–34.0)
MCHC: 32.1 g/dL (ref 32.0–36.0)
MCV: 88.6 fL (ref 80.0–100.0)
MONOS PCT: 5 %
Monocytes Absolute: 1.6 10*3/uL — ABNORMAL HIGH (ref 0.2–1.0)
NEUTROS PCT: 92 %
Neutro Abs: 28.7 10*3/uL — ABNORMAL HIGH (ref 1.4–6.5)
Platelets: 68 10*3/uL — ABNORMAL LOW (ref 150–440)
RBC: 4.26 MIL/uL — AB (ref 4.40–5.90)
RDW: 21 % — AB (ref 11.5–14.5)
WBC: 31.2 10*3/uL — AB (ref 3.8–10.6)
nRBC: 2 /100 WBC — ABNORMAL HIGH

## 2015-07-18 LAB — BASIC METABOLIC PANEL
ANION GAP: 16 — AB (ref 5–15)
Anion gap: 18 — ABNORMAL HIGH (ref 5–15)
BUN: 99 mg/dL — AB (ref 6–20)
BUN: 60 mg/dL — ABNORMAL HIGH (ref 6–20)
CALCIUM: 8.1 mg/dL — AB (ref 8.9–10.3)
CALCIUM: 8.1 mg/dL — AB (ref 8.9–10.3)
CHLORIDE: 98 mmol/L — AB (ref 101–111)
CO2: 18 mmol/L — AB (ref 22–32)
CO2: 22 mmol/L (ref 22–32)
CREATININE: 7.5 mg/dL — AB (ref 0.61–1.24)
Chloride: 102 mmol/L (ref 101–111)
Creatinine, Ser: 5.13 mg/dL — ABNORMAL HIGH (ref 0.61–1.24)
GFR calc non Af Amer: 6 mL/min — ABNORMAL LOW (ref >60)
GFR calc non Af Amer: 9 mL/min — ABNORMAL LOW (ref >60)
GFR, EST AFRICAN AMERICAN: 7 mL/min — AB (ref >60)
GFR, EST AFRICAN AMERICAN: 11 mL/min — AB (ref >60)
Glucose, Bld: 219 mg/dL — ABNORMAL HIGH (ref 65–99)
Glucose, Bld: 177 mg/dL — ABNORMAL HIGH (ref 65–99)
Potassium: 6.4 mmol/L (ref 3.5–5.1)
Potassium: 4.7 mmol/L (ref 3.5–5.1)
SODIUM: 138 mmol/L (ref 135–145)
SODIUM: 136 mmol/L (ref 135–145)

## 2015-07-18 LAB — POTASSIUM: POTASSIUM: 6.5 mmol/L — AB (ref 3.5–5.1)

## 2015-07-18 LAB — BLOOD GAS, ARTERIAL
Acid-base deficit: 10.3 mmol/L — ABNORMAL HIGH (ref 0.0–2.0)
Bicarbonate: 14.8 mEq/L — ABNORMAL LOW (ref 21.0–28.0)
FIO2: 1
Mechanical Rate: 24
O2 SAT: 93.7 %
PCO2 ART: 30 mmHg — AB (ref 32.0–48.0)
PEEP/CPAP: 5 cmH2O
PH ART: 7.3 — AB (ref 7.350–7.450)
PO2 ART: 77 mmHg — AB (ref 83.0–108.0)
Patient temperature: 37
VT: 620 mL

## 2015-07-18 LAB — GLUCOSE, CAPILLARY
GLUCOSE-CAPILLARY: 139 mg/dL — AB (ref 65–99)
GLUCOSE-CAPILLARY: 154 mg/dL — AB (ref 65–99)
GLUCOSE-CAPILLARY: 209 mg/dL — AB (ref 65–99)
Glucose-Capillary: 190 mg/dL — ABNORMAL HIGH (ref 65–99)
Glucose-Capillary: 163 mg/dL — ABNORMAL HIGH (ref 65–99)
Glucose-Capillary: 150 mg/dL — ABNORMAL HIGH (ref 65–99)

## 2015-07-18 LAB — PROCALCITONIN: Procalcitonin: 3.9 ng/mL

## 2015-07-18 LAB — MAGNESIUM: Magnesium: 3 mg/dL — ABNORMAL HIGH (ref 1.7–2.4)

## 2015-07-18 LAB — TROPONIN I: Troponin I: 0.94 ng/mL (ref <0.03)

## 2015-07-18 LAB — TYPE AND SCREEN
ABO/RH(D): O POS
ANTIBODY SCREEN: NEGATIVE

## 2015-07-18 LAB — PHOSPHORUS: Phosphorus: 11.4 mg/dL — ABNORMAL HIGH (ref 2.5–4.6)

## 2015-07-18 LAB — PROTIME-INR
INR: 5.76 — AB
Prothrombin Time: 50 seconds — ABNORMAL HIGH (ref 11.4–15.0)

## 2015-07-18 MED ORDER — DEXTROSE 5 % IV SOLN: INTRAVENOUS | Status: DC

## 2015-07-18 MED ORDER — SODIUM POLYSTYRENE SULFONATE 15 GM/60ML PO SUSP
30.0000 g | Freq: Once | ORAL | Status: AC
  Administered 2015-07-18: 30 g
  Filled 2015-07-18: qty 120

## 2015-07-18 MED ORDER — SODIUM BICARBONATE 8.4 % IV SOLN
INTRAVENOUS | Status: DC
  Administered 2015-07-18 – 2015-07-19 (×2): via INTRAVENOUS
  Filled 2015-07-18 (×5): qty 100

## 2015-07-18 MED ORDER — VASOPRESSIN 20 UNIT/ML IV SOLN
0.0300 [IU]/min | INTRAVENOUS | Status: DC
  Administered 2015-07-18 – 2015-07-20 (×3): 0.03 [IU]/min via INTRAVENOUS
  Filled 2015-07-18 (×3): qty 2

## 2015-07-18 MED ORDER — DOPAMINE-DEXTROSE 3.2-5 MG/ML-% IV SOLN
0.0000 ug/kg/min | INTRAVENOUS | Status: DC
  Administered 2015-07-18: 5 ug/kg/min via INTRAVENOUS

## 2015-07-18 MED ORDER — SODIUM CHLORIDE 0.9 % IV SOLN: Freq: Once | INTRAVENOUS | Status: DC

## 2015-07-18 MED ORDER — DOPAMINE-DEXTROSE 3.2-5 MG/ML-% IV SOLN
INTRAVENOUS | Status: AC
  Administered 2015-07-18: 5 ug/kg/min via INTRAVENOUS
  Filled 2015-07-18: qty 250

## 2015-07-18 MED ORDER — PRO-STAT SUGAR FREE PO LIQD
30.0000 mL | Freq: Two times a day (BID) | ORAL | Status: DC
  Administered 2015-07-18 (×2): 30 mL via ORAL

## 2015-07-18 MED ORDER — VITAL HIGH PROTEIN PO LIQD
1000.0000 mL | ORAL | Status: DC
  Administered 2015-07-18: 1000 mL
  Administered 2015-07-19

## 2015-07-18 NOTE — Plan of Care (Signed)
Problem: Physical Regulation: Goal: Ability to maintain clinical measurements within normal limits will improve Outcome: Progressing Patient had a very difficult time with blood pressure during dialysis. During dialysis start had become restless and appeared anxious so gave prn versed dose. Then as dialysis RN began full treatment,  Patient's blood pressure became very low. Levophed ended up at upper limit of dosage, vasopressin already infusing. Patient had to lay flat for a large portion of dialysis and dopamine had to be started even though heart rate already elevated. Patient had to receive boluses during dialysis and FFP given during dialysis. Patient very obtunded and sedated during most of dialysis treatment, though did become more alert closer to end of treatment and 13:00/. After dialysis treatment ended early and patient's blood rinsed back, blood pressure improved.and patient significantly more alert. Per MD checked patient's basic metabolic panel and potassium within normal limits so per MD patient not started on CRRT this shift. Blood pressure medication titrated down as patient tolerated. Sedation had to be restarted to due to agitation. Temperature slightly elevated when off dialysis and slightly low during dialysis. No bowel movement noted in rectal tube drainage, only return of vancomycin enema.   Problem: Nutritional: Goal: Intake of prescribed amount of daily calories will improve Outcome: Progressing Patient started on low dose tube feeding (20 mL/hr) per MD and dieticians after patient's blood pressure stabilized enough to allow patient to be sat back up to greater than 30 degrees. No changes in assessment (abdomen already was taut and distended).

## 2015-07-18 NOTE — Progress Notes (Signed)
Hd discontinued

## 2015-07-18 NOTE — Progress Notes (Signed)
Paged Intensivist about patient's critical values of potassium of 6.4 and INR of 5.76. Awaiting call back.

## 2015-07-18 NOTE — Progress Notes (Signed)
Pre dialysis assessment 

## 2015-07-18 NOTE — Progress Notes (Signed)
Notified MD about heart rate sustaining in 130s and centralized monitor tech's concern for afib vs ST (this RN unable to tell with current HR) with plan to start dopamine. MD acknowledged and ordered RN to start dopamine and try to lower levophed rate. RN and MD to continue to monitor patient's condition.

## 2015-07-18 NOTE — Progress Notes (Signed)
Post dialysis assessment 

## 2015-07-18 NOTE — Progress Notes (Signed)
Spoke with nephrologist, Dr. Holley Raring, on the phone about patient's dialysis treatment ending early. MD ordered a basic metabolic panel to evaluate effect of shortened treatment. MD only wanted a call back if potassium was outside of normal range. If potassium was still elevated, MD told RN that patient may need to go on CRRT in the afternoon after he stabilizes more.

## 2015-07-18 NOTE — Progress Notes (Signed)
Spoke with Dr. Stevenson Clinch in person about patient's blood pressure despite levophed at 50 mcg/min, vasopressin infusing, HR elevated 120s-130s (ST), dialysis RN stating that ultrafiltration turned off on dialysis machine, patient just laid flat to help improve BP. Dr. Stevenson Clinch acknowledged and concurred with nephrologist recommendation of trying a bolus of fluid through dialysis machine rather than stop dialysis at this time.

## 2015-07-18 NOTE — Progress Notes (Signed)
Nutrition Follow-up  DOCUMENTATION CODES:   Severe malnutrition in context of acute illness/injury  INTERVENTION:   Spoke with Dr. Stevenson Clinch this morning regarding poc. Dr. Stevenson Clinch wanting to initiate EN at this time. Recommend starting Vital high Protein per ICU protocol at 32mL/hr with Prostat BID without titration orders. Recommend checking Mg to am labs this am and again with tomorrow am labs. Phosphorus ordered per Nephrology. Dr. Stevenson Clinch aware of abdominal profile at present; plan to monitor GI carefully on trophic feeds. RN Sarah aware. Will continue to assess and make recommendations accordingly.   NUTRITION DIAGNOSIS:   Malnutrition related to acute illness as evidenced by severe depletion of body fat, severe depletion of muscle mass; being addressed with EN as pt now on ventilator with OG present.  GOAL:   Provide needs based on ASPEN/SCCM guidelines  MONITOR:   Vent status, Weight trends, Labs, I & O's, TF tolerance  REASON FOR ASSESSMENT:   Consult Poor PO  ASSESSMENT:    Pt with acute respiratory failure, transferred to ICU, intubated yesterday and now requiring HD. Vasopressin to be started per Nsg.   Gastrointestinal Profile: Last BM: 07/16/2015 Pt with OG tube in place currently to low intermittent suction, 137mL ouptut documented. Pt also with rectal tube in place, 18mL output documented since placement.   Diet Order:  Diet NPO time specified    Current Nutrition: Pt NPO on the vent. Previously followed by SLP on dysphagia diet and aspiration precautions.   Medications: Pepcid, Fentanyl, SS novolog, Lantus, Zofran, Sodium Bicarbonate at 59mL/hr, Levophed, Vasopressin ordered to start, Coumadin, Kayexelate given  Electrolyte/Renal Profile and Glucose Profile:   Recent Labs Lab 07/14/15 1730 07/15/15 0628  07/17/15 0445 07/17/15 2054 07/18/15 07/18/15 0400  NA 139 138  < > 135 137  --  138  K 4.5 5.5*  < > 5.6* 7.1* 6.5* 6.4*  CL 104 103  < > 103 102   --  102  CO2 25 25  < > 21* 15*  --  18*  BUN 54* 66*  < > 92* 99*  --  99*  CREATININE 4.84* 5.48*  < > 6.86* 7.67*  --  7.50*  CALCIUM 7.6* 7.8*  < > 7.6* 7.8*  --  8.1*  MG 2.1 2.3  --  2.8*  --   --   --   PHOS 6.3* 7.1*  --  8.7*  --   --  11.4*  GLUCOSE 209* 304*  < > 158* 119*  --  219*  < > = values in this interval not displayed.   Weight Trend since Admission: Filed Weights   07/11/15 1515 07/11/15 1850 07/13/15 1045  Weight: 160 lb 7.9 oz (72.8 kg) 159 lb 6.3 oz (72.3 kg) 157 lb 6.5 oz (71.4 kg)  Daily weights ordered with TF protocol, will follow   Skin:   (pressure ulcer noted on coccyx)   BMI:  Body mass index is 21.34 kg/(m^2).  Estimated Nutritional Needs:   Kcal:  1802kcals, (Ve: 14.9, Tmax: 36.9, BEE: 1448kcals)  Protein:  142-178g protein  Fluid:  UOP+1L  EDUCATION NEEDS:   No education needs identified at this time  Dwyane Luo, RD, LDN Pager [phone] Weekend/On-Call Pager [phone]

## 2015-07-18 NOTE — Progress Notes (Signed)
Spoke with Dr. Stevenson Clinch about patient's continued issues with blood pressure and heart rate in 120s despite previous interventions. MD ordered dopamine infusion. Dialysis still in progress per dialysis RN.

## 2015-07-18 NOTE — Progress Notes (Signed)
Dialysis started 

## 2015-07-18 NOTE — Progress Notes (Signed)
MD, Dr. Stevenson Clinch, called back about critical lab values, see previous note. MD acknowledged and confirmed that no coumadin scheduled for this morning. No new orders, but MD stated that patient will need dialysis today.

## 2015-07-18 NOTE — Progress Notes (Signed)
Subjective:  Patient due for hemodialysis today. Patient is still hypotensive. Discussed with pulmonary critical care and vasopressin will be added to medication regimen. Patient remains on the ventilator. Wife at bedside and updated.   Objective:  Vital signs in last 24 hours:  Temp:  [93.7 F (34.3 C)-99.1 F (37.3 C)] 98.6 F (37 C) (07/04 0845) Pulse Rate:  [25-113] 101 (07/04 0830) Resp:  [12-30] 24 (07/04 0845) BP: (56-252)/(39-208) 71/49 mmHg (07/04 0845) SpO2:  [2 %-100 %] 94 % (07/04 0830) FiO2 (%):  [60 %-100 %] 80 % (07/04 0748)  Weight change:  Filed Weights   07/11/15 1515 07/11/15 1850 07/13/15 1045  Weight: 72.8 kg (160 lb 7.9 oz) 72.3 kg (159 lb 6.3 oz) 71.4 kg (157 lb 6.5 oz)    Intake/Output:    Intake/Output Summary (Last 24 hours) at 07/18/15 1006 Last data filed at 07/18/15 0900  Gross per 24 hour  Intake 2424.84 ml  Output    100 ml  Net 2324.84 ml     Physical Exam: General: Critically ill appearing  HEENT anicteric  Neck supple  Pulm/lungs B/l crackles, on the vent  CVS/Heart irregular, soft systolic murmur  Abdomen:  Decreased distension, BS present  Extremities: No peripheral edema  Neurologic: On the ventilator, does follow simple commands  Skin: B/l UE ecchymoses  Access: Left IJ PermCath       Basic Metabolic Panel:   Recent Labs Lab 07/14/15 1730 07/15/15 0628 07/16/15 0500 07/17/15 0445 07/17/15 2054 07/18/15 07/18/15 0400  NA 139 138 138 135 137  --  138  K 4.5 5.5* 4.9 5.6* 7.1* 6.5* 6.4*  CL 104 103 102 103 102  --  102  CO2 25 25 23  21* 15*  --  18*  GLUCOSE 209* 304* 182* 158* 119*  --  219*  BUN 54* 66* 78* 92* 99*  --  99*  CREATININE 4.84* 5.48* 6.22* 6.86* 7.67*  --  7.50*  CALCIUM 7.6* 7.8* 7.8* 7.6* 7.8*  --  8.1*  MG 2.1 2.3  --  2.8*  --   --   --   PHOS 6.3* 7.1*  --  8.7*  --   --   --      CBC:  Recent Labs Lab 07/14/15 0528 07/14/15 1730 07/15/15 0628 07/16/15 0500 07/17/15 0445  07/18/15 0400  WBC 47.2*  --  44.8* 44.2* 30.3* 31.2*  NEUTROABS  --   --   --   --   --  28.7*  HGB 14.2 12.5* 12.8* 13.0 12.6* 12.1*  HCT 44.4 39.7* 41.3 39.8* 39.2* 37.7*  MCV 87.1  --  89.7 87.0 88.8 88.6  PLT 194  --  151 112* 77* 68*      Microbiology:  Recent Results (from the past 720 hour(s))  C difficile quick scan w PCR reflex     Status: Abnormal   Collection Time: 06/24/2015  1:45 AM  Result Value Ref Range Status   C Diff antigen POSITIVE (A) NEGATIVE Final   C Diff toxin POSITIVE (A) NEGATIVE Final   C Diff interpretation   Final    Positive for toxigenic C. difficile, active toxin production present.    Comment: CRITICAL RESULT CALLED TO, READ BACK BY AND VERIFIED WITH:  JACK DOUGHERTY AT 9381 07/11/15 SDR   Blood culture (routine x 2)     Status: None   Collection Time: 06/18/2015  2:10 PM  Result Value Ref Range Status   Specimen Description BLOOD RIGHT  ASSIST CONTROL  Final   Special Requests BOTTLES DRAWN AEROBIC AND ANAEROBIC [place]  Final   Culture NO GROWTH 5 DAYS  Final   Report Status 07/15/2015 FINAL  Final  Blood culture (routine x 2)     Status: None   Collection Time: 06/30/2015  2:10 PM  Result Value Ref Range Status   Specimen Description BLOOD RIGHT HAND  Final   Special Requests   Final    BOTTLES DRAWN AEROBIC AND ANAEROBIC [place]   Culture NO GROWTH 5 DAYS  Final   Report Status 07/15/2015 FINAL  Final  MRSA PCR Screening     Status: Abnormal   Collection Time: 07/11/15  7:06 PM  Result Value Ref Range Status   MRSA by PCR POSITIVE (A) NEGATIVE Final    Comment:        The GeneXpert MRSA Assay (FDA approved for NASAL specimens only), is one component of a comprehensive MRSA colonization surveillance program. It is not intended to diagnose MRSA infection nor to guide or monitor treatment for MRSA infections. READ BACK AND VERIFIED BY MARCELLA TURNER AT 2120 07/11/2015.  TFK   Culture, expectorated sputum-assessment      Status: None   Collection Time: 07/17/15  3:50 PM  Result Value Ref Range Status   Specimen Description TRACHEAL ASPIRATE  Final   Special Requests Normal  Final   Sputum evaluation   Final    Sputum specimen not acceptable for testing.  Please recollect.   CALLED [facility]ORE 07/17/15 @ 8891  [place]   Report Status 07/17/2015 FINAL  Final  Culture, expectorated sputum-assessment     Status: None   Collection Time: 07/17/15  3:50 PM  Result Value Ref Range Status   Specimen Description SPUTUM  Final   Special Requests NONE  Final   Sputum evaluation THIS SPECIMEN IS ACCEPTABLE FOR SPUTUM CULTURE  Final   Report Status 07/17/2015 FINAL  Final    Coagulation Studies:  Recent Labs  07/16/15 0500 07/17/15 0445 07/18/15 0400  LABPROT 32.8* 38.3* 50.0*  INR 3.29 4.04* 5.76*    Urinalysis: No results for input(s): COLORURINE, LABSPEC, PHURINE, GLUCOSEU, HGBUR, BILIRUBINUR, KETONESUR, PROTEINUR, UROBILINOGEN, NITRITE, LEUKOCYTESUR in the last 72 hours.  Invalid input(s): APPERANCEUR    Imaging: Dg Chest 1 View  07/18/2015  CLINICAL DATA:  Acute respiratory failure. EXAM: CHEST 1 VIEW COMPARISON:  07/17/2015. FINDINGS: Tubes and lines are in good position, stable. LEFT lower lobe atelectasis with effusion, unchanged. No pneumothorax. IMPRESSION: No active disease. Electronically Signed   By: Staci Righter M.D.   On: 07/18/2015 07:31   Dg Abd 1 View  07/17/2015  CLINICAL DATA:  Post OG tube placement EXAM: ABDOMEN - 1 VIEW COMPARISON:  CT 07/16/2015 FINDINGS: Enteric tube has been placed with the tip in the distal stomach. Mild gaseous distention of the stomach. Prior stent graft repair of abdominal aortic aneurysm. Gas throughout mildly prominent large and small bowel loops. IMPRESSION: NG tube tip in the distal stomach. Electronically Signed   By: Rolm Baptise M.D.   On: 07/17/2015 13:29   Ct Head Wo Contrast  07/17/2015  CLINICAL DATA:  Postop cardiac arrest EXAM: CT HEAD WITHOUT CONTRAST  TECHNIQUE: Contiguous axial images were obtained from the base of the skull through the vertex without intravenous contrast. COMPARISON:  11/28/2013 FINDINGS: Brain: No intracranial hemorrhage, mass effect or midline shift. Stable cerebral atrophy. Stable mild periventricular chronic white matter disease. No definite acute cortical infarction. No mass lesion is noted on  this unenhanced scan. Ventricular size is stable from prior exam. Vascular: Mild atherosclerotic calcifications of carotid siphon. Skull: No skull fracture is noted. Sinuses/Orbits: Paranasal sinuses are unremarkable. Stable postsurgical changes left mastoid. Some fluid in left mastoid air cells again noted. Other: None IMPRESSION: No acute intracranial abnormality. No definite acute cortical infarction. Stable atrophy and chronic white matter disease. Electronically Signed   By: Lahoma Crocker M.D.   On: 07/17/2015 14:05   Ct Abdomen Pelvis W Contrast  07/16/2015  CLINICAL DATA:  Positive for Celsius diff. Generalized abdominal pain with nausea and vomiting. EXAM: CT ABDOMEN AND PELVIS WITH CONTRAST TECHNIQUE: Multidetector CT imaging of the abdomen and pelvis was performed using the standard protocol following bolus administration of intravenous contrast. CONTRAST:  163m ISOVUE-300 IOPAMIDOL (ISOVUE-300) INJECTION 61% COMPARISON:  03/04/2012 FINDINGS: Lower chest: There is bibasilar collapse/consolidation with small bilateral pleural effusions. Heart is markedly enlarged. Hepatobiliary: No focal abnormality within the liver parenchyma. There is no evidence for gallstones, gallbladder wall thickening, or pericholecystic fluid. No intrahepatic or extrahepatic biliary dilation. Pancreas: No focal mass lesion. No dilatation of the main duct. No intraparenchymal cyst. No peripancreatic edema. Spleen: No splenomegaly. No focal mass lesion. Adrenals/Urinary Tract: No adrenal nodule or mass. Kidneys are atrophic, left greater than right with cystic change  in the kidneys compatible with end-stage renal disease in this patient on hemodialysis. Bladder is decompressed. Stomach/Bowel: Stomach is nondistended. No gastric wall thickening. No evidence of outlet obstruction. Duodenum is normally positioned as is the ligament of Treitz. No small bowel wall thickening. No small bowel dilatation. The terminal ileum is normal. The appendix is not visualized. Colon is distended with diffuse irregular and edematous wall thickening throughout. Associated diverticular changes are seen in the sigmoid colon. Vascular/Lymphatic: Patient is status post aortic endograft placement for aneurysm with fem-fem bypass graft without opacifies. Right common iliac artery is occluded. There is no gastrohepatic or hepatoduodenal ligament lymphadenopathy. 11 mm left para-aortic lymph node is seen on image 45 series 2. 9 mm short axis aortocaval lymph node is seen on image 51. No pelvic sidewall lymphadenopathy. Reproductive: The prostate gland and seminal vesicles have normal imaging features. Other: Small to moderate volume intraperitoneal free fluid seen around the liver and spleen with some fluid tracking in the para colic gutters down into the pelvis. Musculoskeletal: Status post left dynamic hip screw placement. Bones are diffusely demineralized. Bone windows reveal no worrisome lytic or sclerotic osseous lesions. IMPRESSION: 1. Diffuse edematous wall thickening, consistent with the reported history of C. difficile infection. 2. Small the moderate bilateral pleural effusions with bilateral lower lobe collapse/consolidation. 3. Small to moderate volume ascites. 4. Abdominal aortic atherosclerosis. 5. Borderline retroperitoneal lymphadenopathy. 6. Other incidental findings as outlined above. Electronically Signed   By: EMisty StanleyM.D.   On: 07/16/2015 12:43   Dg Chest Port 1 View  07/17/2015  CLINICAL DATA:  Post intubation EXAM: PORTABLE CHEST 1 VIEW COMPARISON:  07/16/2015 FINDINGS:  Cardiomediastinal silhouette is stable. Endotracheal tube in place with tip 7.8 cm above the carina. NG tube in place with tip in distal stomach. Stable patchy airspace opacification right middle lobe. Trace left pleural effusion with left basilar atelectasis. Small amount of fluid is noted in right minor fissure. There is no pneumothorax. Stable right IJ central line position. Stable dual lumen left IJ central line position. No pneumothorax IMPRESSION: Endotracheal tube in place. No pneumothorax. Small left pleural effusion with left basilar atelectasis. Trace fluid noted in right minor fissure. No pulmonary edema.  Stable patchy airspace opacification in right middle lobe. Electronically Signed   By: Lahoma Crocker M.D.   On: 07/17/2015 13:33     Medications:   . fentaNYL infusion INTRAVENOUS Stopped (07/18/15 0930)  . norepinephrine (LEVOPHED) Adult infusion 40 mcg/min (07/18/15 0945)  .  sodium bicarbonate  infusion 1000 mL 75 mL/hr at 07/18/15 0700  . vasopressin (PITRESSIN) infusion - *FOR SHOCK*     . sodium chloride   Intravenous Once  . acidophilus  1 capsule Oral BID  . antiseptic oral rinse  7 mL Mouth Rinse 10 times per day  . atorvastatin  80 mg Oral QHS  . budesonide (PULMICORT) nebulizer solution  0.25 mg Nebulization BID  . chlorhexidine gluconate (SAGE KIT)  15 mL Mouth Rinse BID  . famotidine (PEPCID) IV  20 mg Intravenous Q24H  . feeding supplement (ENSURE ENLIVE)  237 mL Oral TID  . fentaNYL (SUBLIMAZE) injection  50 mcg Intravenous Once  . fentaNYL (SUBLIMAZE) injection  50 mcg Intravenous Once  . hydrocortisone sod succinate (SOLU-CORTEF) inj  50 mg Intravenous Q6H  . insulin aspart  0-20 Units Subcutaneous TID WC  . insulin glargine  20 Units Subcutaneous QHS  . ipratropium-albuterol  3 mL Nebulization Q4H  . magic mouthwash  5 mL Oral TID  . metronidazole  500 mg Intravenous Q8H  . ondansetron (ZOFRAN) IV  4 mg Intravenous Q6H  . sodium chloride flush  3 mL Intravenous  Q12H  . vancomycin  500 mg Oral Q6H  . vancomycin (VANCOCIN) rectal ENEMA  500 mg Rectal Q6H  . Warfarin - Pharmacist Dosing Inpatient   Does not apply q1800   acetaminophen **OR** acetaminophen, fentaNYL, gi cocktail, ipratropium-albuterol, lidocaine-prilocaine, midazolam, midazolam, nitroGLYCERIN, traMADol  Assessment/ Plan:  82 y.o. male with end-stage renal disease, hemodialysis [address], Tuesday, Thursday, Saturday, history of severe vascular disease including aortic-iliac bypass,[facility]vessel [facility]G [facility] March 2017, postop wound infection, coronary disease, history of RCA stent, COPD, C diff infection and acute respiratory failure 06/2015  1. End-stage renal disease.  TuesdayThursday/Saturday.  [address] - Patient in need of hemodialysis today as he is hyperkalemic. We will use a 1 potassium bath for one hour and then transition to a 2 potassium bath.  2. Anemia of chronic kidney disease. Hemoglobin 12.1. Hold off on Epogen.  3.  Supratherapeutic INR:  INR up to 5.76. Plan per pulmonary critical care.  4. Sepsis   Continue metronidazole and vancomycin.  5.  Secondary hyperparathyroidism Repeat phosphorus today as most recent phosphorus was 7.1.  6. C. Difficile colitis Currently metronidazole IV, vancomycin oral  7.  Acute respiratory failure.  Continue ventilatory support. FiO2 80% at the moment. Ultrafiltration target 1.5 kg as tolerated.  LOS: [place], Nubia Ziesmer 7/4/201710:06 AM

## 2015-07-18 NOTE — Progress Notes (Signed)
[facility] Progress Note Patient Name: Colsyn Mcguffey DOB: 1932/10/27 MRN: [id number]   Date of Service  07/18/2015  HPI/Events of Note  Multiple issues: K+ = 6.5 and BP = 88/54 on Norepinephrine IV infusion at 40 mcg/min.  eICU Interventions  Will order: 1. Kayexalate 30 gm via tube now. 2. Send AM ABG now.  3. Titrate Norepinephrine IV infusion to ceiling.      Intervention Category Major Interventions: Acid-Base disturbance - evaluation and management;Electrolyte abnormality - evaluation and management;Hypotension - evaluation and management  Marquel Spoto [place] 07/18/2015, 1:48 AM

## 2015-07-18 NOTE — Progress Notes (Signed)
PULMONARY / CRITICAL CARE MEDICINE   Name: John Robinson MRN: [id number] DOB: Mar 07, 1932    ADMISSION DATE:  07/07/2015 CONSULTATION DATE: 07/17/15  REFERRING MD:  Sherley Bounds  CHIEF COMPLAINT:  Acute Respiratory failure   PATIENT PROFILE: 82 year old male severe debility, ESRD and deconditioning, status post long hospital admission for CABG, complicated by sternal wound infection. Went home one week ago after 3 months of being in medical facilities, returned with C. difficile with sepsis. Patient noted to have gasping respirations and appeared to be in respiratory distress, therefore decided to intubate the patient.  Patient had brieif loss of pulse, ACLS was initiated with ROSC. Advanced airway placed and the patient is mechanically ventilated.   DNR  SUBJECTIVE: Periods of agitation overnight, elevated K, getting HD today. SBT this am with agitation and low BP.   VITAL SIGNS: BP 91/67 mmHg  Pulse 95  Temp(Src) 98.4 F (36.9 C) (Other (Comment))  Resp 24  Ht 6' (1.829 m)  Wt 157 lb 6.5 oz (71.4 kg)  BMI 21.34 kg/m2  SpO2 95%  HEMODYNAMICS: CVP:  [7 mmHg-11 mmHg] 7 mmHg  VENTILATOR SETTINGS: Vent Mode:  [-] PRVC FiO2 (%):  [60 %-100 %] 80 % Set Rate:  [24 bmp] 24 bmp Vt Set:  [620 mL] 620 mL PEEP:  [5 cmH20] 5 cmH20 Plateau Pressure:  [21 cmH20-22 cmH20] 22 cmH20  INTAKE / OUTPUT: I/O last 3 completed shifts: In: 2582.9 [P.O.:303; I.V.:1269.9; Other:250; IV Piggyback:760] Out: 100 [Emesis/NG output:100]  PHYSICAL EXAMINATION: General: sickly appearing,white male noted to be in respiratory distress. Neuro: somnolent, will wake up agitated HEENT: Atraumatic, normocephalic, no discharge. Cardiovascular: irregular, S1S2, no MRG noted Lungs: coarse and crackly throughout, symmetrical chest expansion. ETT with minimal secretions Abdomen:  Distended, tender on light palpation, firm Musculoskeletal:  No inflammation/ deformity noted Skin:   Mid sternal surgical  incision, dressing CDI.  LABS:  BMET  Recent Labs Lab 07/17/15 0445 07/17/15 2054 07/18/15 07/18/15 0400  NA 135 137  --  138  K 5.6* 7.1* 6.5* 6.4*  CL 103 102  --  102  CO2 21* 15*  --  18*  BUN 92* 99*  --  99*  CREATININE 6.86* 7.67*  --  7.50*  GLUCOSE 158* 119*  --  219*    Electrolytes  Recent Labs Lab 07/14/15 1730 07/15/15 0628  07/17/15 0445 07/17/15 2054 07/18/15 0400  CALCIUM 7.6* 7.8*  < > 7.6* 7.8* 8.1*  MG 2.1 2.3  --  2.8*  --   --   PHOS 6.3* 7.1*  --  8.7*  --   --   < > = values in this interval not displayed.  CBC  Recent Labs Lab 07/16/15 0500 07/17/15 0445 07/18/15 0400  WBC 44.2* 30.3* 31.2*  HGB 13.0 12.6* 12.1*  HCT 39.8* 39.2* 37.7*  PLT 112* 77* 68*    Coag's  Recent Labs Lab 07/16/15 0500 07/17/15 0445 07/18/15 0400  INR 3.29 4.04* 5.76*    Sepsis Markers  Recent Labs Lab 07/14/15 1730  07/16/15 0500 07/17/15 1445 07/17/15 1703 07/18/15 0400  LATICACIDVEN 2.2*  --   --  6.2* 7.2*  --   PROCALCITON 2.30  < > 2.06 1.70  --  3.90  < > = values in this interval not displayed.  ABG  Recent Labs Lab 07/14/15 1800 07/17/15 1613 07/18/15 0148  PHART 7.32* 7.22* 7.30*  PCO2ART 48 28* 30*  PO2ART  --  109* 77*    Liver Enzymes No  results for input(s): AST, ALT, ALKPHOS, BILITOT, ALBUMIN in the last 168 hours.  Cardiac Enzymes  Recent Labs Lab 07/17/15 1549 07/17/15 2054 07/18/15 0249  TROPONINI 0.36* 0.67* 0.94*    Glucose  Recent Labs Lab 07/17/15 1129 07/17/15 1519 07/17/15 2115 07/18/15 0028 07/18/15 0435 07/18/15 0717  GLUCAP 99 101* 121* 154* 190* 209*    Imaging Dg Chest 1 View  07/18/2015  CLINICAL DATA:  Acute respiratory failure. EXAM: CHEST 1 VIEW COMPARISON:  07/17/2015. FINDINGS: Tubes and lines are in good position, stable. LEFT lower lobe atelectasis with effusion, unchanged. No pneumothorax. IMPRESSION: No active disease. Electronically Signed   By: Staci Righter M.D.   On:  07/18/2015 07:31   Dg Abd 1 View  07/17/2015  CLINICAL DATA:  Post OG tube placement EXAM: ABDOMEN - 1 VIEW COMPARISON:  CT 07/16/2015 FINDINGS: Enteric tube has been placed with the tip in the distal stomach. Mild gaseous distention of the stomach. Prior stent graft repair of abdominal aortic aneurysm. Gas throughout mildly prominent large and small bowel loops. IMPRESSION: NG tube tip in the distal stomach. Electronically Signed   By: Rolm Baptise M.D.   On: 07/17/2015 13:29   Ct Head Wo Contrast  07/17/2015  CLINICAL DATA:  Postop cardiac arrest EXAM: CT HEAD WITHOUT CONTRAST TECHNIQUE: Contiguous axial images were obtained from the base of the skull through the vertex without intravenous contrast. COMPARISON:  11/28/2013 FINDINGS: Brain: No intracranial hemorrhage, mass effect or midline shift. Stable cerebral atrophy. Stable mild periventricular chronic white matter disease. No definite acute cortical infarction. No mass lesion is noted on this unenhanced scan. Ventricular size is stable from prior exam. Vascular: Mild atherosclerotic calcifications of carotid siphon. Skull: No skull fracture is noted. Sinuses/Orbits: Paranasal sinuses are unremarkable. Stable postsurgical changes left mastoid. Some fluid in left mastoid air cells again noted. Other: None IMPRESSION: No acute intracranial abnormality. No definite acute cortical infarction. Stable atrophy and chronic white matter disease. Electronically Signed   By: Lahoma Crocker M.D.   On: 07/17/2015 14:05   Dg Chest Port 1 View  07/17/2015  CLINICAL DATA:  Post intubation EXAM: PORTABLE CHEST 1 VIEW COMPARISON:  07/16/2015 FINDINGS: Cardiomediastinal silhouette is stable. Endotracheal tube in place with tip 7.8 cm above the carina. NG tube in place with tip in distal stomach. Stable patchy airspace opacification right middle lobe. Trace left pleural effusion with left basilar atelectasis. Small amount of fluid is noted in right minor fissure. There is no  pneumothorax. Stable right IJ central line position. Stable dual lumen left IJ central line position. No pneumothorax IMPRESSION: Endotracheal tube in place. No pneumothorax. Small left pleural effusion with left basilar atelectasis. Trace fluid noted in right minor fissure. No pulmonary edema. Stable patchy airspace opacification in right middle lobe. Electronically Signed   By: Lahoma Crocker M.D.   On: 07/17/2015 13:33     STUDIES:  7/3 CT head without contrast>>No ICH 7/3 CT abdomen and pelvis>>Diffuse edematous wall thickening, consistent with the reported history of C. difficile infection. . Small the moderate bilateral pleural effusions with bilaterallower lobe collapse/consolidation. Small to moderate volume ascites. Abdominal aortic atherosclerosis.5. Borderline retroperitoneal lymphadenopathy. Other incidental findings as outlined above.  CULTURES: 7/3 sputum culture>> 6/26 blood culture NGTD 6/26 C-Diff>> Positive 7/3 sputum cx>  ANTIBIOTICS: vancomycin 6/26 >> 6/27 Azithromycin 6/26 >> 6/27 cefepime 6/26 >> 6/28 metronidazole 6/27 >> Vancomycin PO 6/27 >>   SIGNIFICANT EVENTS 07/17/15>>Patient had brief episode of loss of pulse and ACLS started, received  epi x 2, bicarb x 1 7/3>>  Patient was in respiratory distress requiring emergent intubation,thick secretions, possible aspiration 7/4> HD, elevated INR, 1u FFP, more awake, still on pressors    LINES/TUBES: 7/3 ET tube>> 6/30 Right internal jugular>>   ASSESSMENT / PLAN:  PULMONARY A: Acute hypoxemic respiratory failure Aspiration PNA Severe COPD/Emphysema  P:   Full vent support SBT trials daily Fentanyl/Versed Routine ABG CXR in am  bronchodilators Keep oxygen sats  greater than 88% Solu-Cortef 50 mg every 6 hours Improving mentation, depressed respiratory status, continue to monitor  CARDIOVASCULAR A:  Cardiopulmonary arrest possibly related to aspiration Septic shock related to C. Difficile A. fib  with RVR P:  Follow troponins Levoped/ vaso, wean pressors as tolerated MAP> 65   RENAL A:   ESRD On HD Hyperkalemia P:   Nephrology following [employer] K shift overnight.  HD today  GASTROINTESTINAL A:   C difficile colitis P:   Continue Flagyl and oral vancomycin for C. difficile sepsis.ID has added rectal vanc 7/3 Check pro calcitonin and monitor for fever. Lactic acid Follow cultures  HEMATOLOGIC A:   H/O Severe peripheral vascular disease CABG with stenting, on anticoagulation P:  INR-5.76, hold Coumadin, give 1u ffp today  INFECTIOUS A:   Leukocytosis with increasing white cell count, due to C. difficile with sepsis and steroids P:   Continue Flagyl and oral vancomycin for C. difficile sepsis. Check pro calcitonin and monitor for fever. f/u cultures Follow lactic acid Follow pro-calcitonin  ENDOCRINE Hyperglycemia-steroid induced  P:  Continue Levemir to 20 units daily at bedtime, and continue NovoLog correction scale. Blood sugar checks every 4 hours  NEUROLOGIC Metabolic encephalopathy P:   RASS goal 0- -1  CT head>> 7/3 No acute intracranial abnormality.    FAMILY  - Updates: Family was updated. by Dr. Vilinda Boehringer.    CODE STATUS - DNR   07/18/2015, 10:44 AM  The patient is critically ill with multiple organ systems failure and requires high complexity decision making for assessment and support, frequent evaluation and titration of therapies, application of advanced monitoring technologies and extensive interpretation of multiple databases.   Critical Care Time devoted to patient care services described in this note is  45 Minutes.   This time reflects time of care of this signee Dr Vilinda Boehringer.  This critical care time does not reflect procedure time, or teaching time or supervisory time of PA/NP/Med-student/Med Resident etc but could involve care discussion time.  Vilinda Boehringer, MD [facility] Pager [phone] (please enter 7-digits) On Call Pager [phone] (please enter 7-digits)  Note: This note was prepared with Dragon dictation along with smaller phrase technology. Any transcriptional errors that result from this process are unintentional.

## 2015-07-18 NOTE — Progress Notes (Signed)
[facility] Progress Note Patient Name: [name] DOB: 03/12/1932 MRN: [id number]   Date of Service  07/18/2015  HPI/Events of Note  ABG = 7.30/30/77/14.8.  eICU Interventions  Continue NaHCO3 IV infusion as ordered.      Intervention Category Major Interventions: Acid-Base disturbance - evaluation and management  Tedi Hughson [place] 07/18/2015, 3:04 AM

## 2015-07-18 NOTE — Progress Notes (Signed)
ANTICOAGULATION CONSULT NOTE - Follow up [facility] for Warfarin  Indication: VTE prophylaxis  Allergies  Allergen Reactions  . Pletal [Cilostazol] Other (See Comments)    Reaction:  Dizziness    Patient Measurements: Height: 6' (182.9 cm) Weight: 157 lb 6.5 oz (71.4 kg) IBW/kg (Calculated) : 77.6  Vital Signs: Temp: 99.1 F (37.3 C) (07/04 0715) Temp Source: Other (Comment) (07/04 0715) BP: 62/42 mmHg (07/04 0715) Pulse Rate: 89 (07/04 0715)  Labs:  Recent Labs  07/16/15 0500  07/17/15 0445  07/17/15 1549 07/17/15 2054 07/18/15 0249 07/18/15 0400  HGB 13.0  --  12.6*  --   --   --   --  12.1*  HCT 39.8*  --  39.2*  --   --   --   --  37.7*  PLT 112*  --  77*  --   --   --   --  68*  LABPROT 32.8*  --  38.3*  --   --   --   --  50.0*  INR 3.29  --  4.04*  --   --   --   --  5.76*  CREATININE 6.22*  --  6.86*  --   --  7.67*  --  7.50*  TROPONINI  --   < > 0.13*  < > 0.36* 0.67* 0.94*  --   < > = values in this interval not displayed.  Estimated Creatinine Clearance: 7.8 mL/min (by C-G formula based on Cr of 7.5).  Assessment: Pharmacy consulted to dose warfarin in this 82 year old male admitted with PNA and Cdiff. Currently also on metronidazole and oral vancomycin.  Pt was on warfarin 5 mg PO QHS at home.  INR on 6/26 was 4.25.   6/28 INR  2.95  Warfarin 2 mg 6/29 INR  3.78  none 6/30 INR  4.06  none 7/1   INR  3.45  none 7/2   INR  3.29  none 7/3   INR  4.04  None 7/4   INR  5.76  Goal of Therapy:  INR 2-3   Plan:  INR remains supratherapeutic. Will continue to hold Coumadin and f/u AM INR.   [place] Clinical Pharmacist 07/18/2015,7:57 AM

## 2015-07-18 NOTE — Progress Notes (Signed)
Palliative:  Reviewed notes. Checked in with Mr. Laviola wife and son at bedside. They acknowledge his struggle since April and severity of his current condition. They have had multiple discussions and desire to continue aggressive care at this time (we did not discuss this in depth). Current state of watchful waiting. Offered support. Will need follow up for ongoing support and goals of care discussion.   No Charge  Vinie Sill, NP Palliative Medicine Team Pager # [phone] (M-F 8a-5p) Team Phone # [phone] (Nights/Weekends)

## 2015-07-19 ENCOUNTER — Ambulatory Visit: Payer: Commercial Managed Care - HMO | Admitting: Family Medicine

## 2015-07-19 ENCOUNTER — Inpatient Hospital Stay: Payer: Commercial Managed Care - HMO

## 2015-07-19 DIAGNOSIS — Z8701 Personal history of pneumonia (recurrent): Secondary | ICD-10-CM

## 2015-07-19 DIAGNOSIS — T8130XD Disruption of wound, unspecified, subsequent encounter: Secondary | ICD-10-CM

## 2015-07-19 DIAGNOSIS — E1122 Type 2 diabetes mellitus with diabetic chronic kidney disease: Secondary | ICD-10-CM | POA: Diagnosis not present

## 2015-07-19 DIAGNOSIS — E43 Unspecified severe protein-calorie malnutrition: Secondary | ICD-10-CM

## 2015-07-19 DIAGNOSIS — D6859 Other primary thrombophilia: Secondary | ICD-10-CM

## 2015-07-19 DIAGNOSIS — L89152 Pressure ulcer of sacral region, stage 2: Secondary | ICD-10-CM

## 2015-07-19 LAB — PROCALCITONIN: Procalcitonin: 5.06 ng/mL

## 2015-07-19 LAB — BASIC METABOLIC PANEL
ANION GAP: 16 — AB (ref 5–15)
BUN: 71 mg/dL — ABNORMAL HIGH (ref 6–20)
CO2: 24 mmol/L (ref 22–32)
Calcium: 7.3 mg/dL — ABNORMAL LOW (ref 8.9–10.3)
Chloride: 95 mmol/L — ABNORMAL LOW (ref 101–111)
Creatinine, Ser: 6.14 mg/dL — ABNORMAL HIGH (ref 0.61–1.24)
GFR, EST AFRICAN AMERICAN: 9 mL/min — AB (ref >60)
GFR, EST NON AFRICAN AMERICAN: 8 mL/min — AB (ref >60)
GLUCOSE: 199 mg/dL — AB (ref 65–99)
POTASSIUM: 4.7 mmol/L (ref 3.5–5.1)
Sodium: 135 mmol/L (ref 135–145)

## 2015-07-19 LAB — RENAL FUNCTION PANEL
ALBUMIN: 2.1 g/dL — AB (ref 3.5–5.0)
ANION GAP: 13 (ref 5–15)
Albumin: 1.7 g/dL — ABNORMAL LOW (ref 3.5–5.0)
Albumin: 2.1 g/dL — ABNORMAL LOW (ref 3.5–5.0)
Anion gap: 15 (ref 5–15)
Anion gap: 11 (ref 5–15)
BUN: 70 mg/dL — ABNORMAL HIGH (ref 6–20)
BUN: 58 mg/dL — ABNORMAL HIGH (ref 6–20)
BUN: 49 mg/dL — ABNORMAL HIGH (ref 6–20)
CHLORIDE: 98 mmol/L — AB (ref 101–111)
CHLORIDE: 99 mmol/L — AB (ref 101–111)
CHLORIDE: 101 mmol/L (ref 101–111)
CO2: 22 mmol/L (ref 22–32)
CO2: 25 mmol/L (ref 22–32)
CO2: 25 mmol/L (ref 22–32)
CREATININE: 6.03 mg/dL — AB (ref 0.61–1.24)
Calcium: 7.3 mg/dL — ABNORMAL LOW (ref 8.9–10.3)
Calcium: 7.8 mg/dL — ABNORMAL LOW (ref 8.9–10.3)
Calcium: 7.8 mg/dL — ABNORMAL LOW (ref 8.9–10.3)
Creatinine, Ser: 4.69 mg/dL — ABNORMAL HIGH (ref 0.61–1.24)
Creatinine, Ser: 4 mg/dL — ABNORMAL HIGH (ref 0.61–1.24)
GFR calc Af Amer: 9 mL/min — ABNORMAL LOW (ref >60)
GFR calc Af Amer: 12 mL/min — ABNORMAL LOW (ref >60)
GFR calc non Af Amer: 11 mL/min — ABNORMAL LOW (ref >60)
GFR, EST AFRICAN AMERICAN: 15 mL/min — AB (ref >60)
GFR, EST NON AFRICAN AMERICAN: 8 mL/min — AB (ref >60)
GFR, EST NON AFRICAN AMERICAN: 13 mL/min — AB (ref >60)
GLUCOSE: 177 mg/dL — AB (ref 65–99)
GLUCOSE: 111 mg/dL — AB (ref 65–99)
Glucose, Bld: 122 mg/dL — ABNORMAL HIGH (ref 65–99)
PHOSPHORUS: 5.6 mg/dL — AB (ref 2.5–4.6)
POTASSIUM: 4.6 mmol/L (ref 3.5–5.1)
POTASSIUM: 4.5 mmol/L (ref 3.5–5.1)
Phosphorus: 7.5 mg/dL — ABNORMAL HIGH (ref 2.5–4.6)
Phosphorus: 6.5 mg/dL — ABNORMAL HIGH (ref 2.5–4.6)
Potassium: 4.5 mmol/L (ref 3.5–5.1)
Sodium: 135 mmol/L (ref 135–145)
Sodium: 137 mmol/L (ref 135–145)
Sodium: 137 mmol/L (ref 135–145)

## 2015-07-19 LAB — PREPARE FRESH FROZEN PLASMA: Unit division: 0

## 2015-07-19 LAB — MAGNESIUM
MAGNESIUM: 2.5 mg/dL — AB (ref 1.7–2.4)
MAGNESIUM: 2.4 mg/dL (ref 1.7–2.4)
MAGNESIUM: 2.3 mg/dL (ref 1.7–2.4)
MAGNESIUM: 2 mg/dL (ref 1.7–2.4)
Magnesium: 2.1 mg/dL (ref 1.7–2.4)

## 2015-07-19 LAB — CBC
HCT: 33.5 % — ABNORMAL LOW (ref 40.0–52.0)
Hemoglobin: 11.1 g/dL — ABNORMAL LOW (ref 13.0–18.0)
MCH: 29.1 pg (ref 26.0–34.0)
MCHC: 33 g/dL (ref 32.0–36.0)
MCV: 88.3 fL (ref 80.0–100.0)
PLATELETS: 47 10*3/uL — AB (ref 150–440)
RBC: 3.8 MIL/uL — AB (ref 4.40–5.90)
RDW: 20.9 % — ABNORMAL HIGH (ref 11.5–14.5)
WBC: 21.7 10*3/uL — AB (ref 3.8–10.6)

## 2015-07-19 LAB — PHOSPHORUS: PHOSPHORUS: 8.4 mg/dL — AB (ref 2.5–4.6)

## 2015-07-19 LAB — EXPECTORATED SPUTUM ASSESSMENT W REFEX TO RESP CULTURE

## 2015-07-19 LAB — GLUCOSE, CAPILLARY
GLUCOSE-CAPILLARY: 113 mg/dL — AB (ref 65–99)
GLUCOSE-CAPILLARY: 153 mg/dL — AB (ref 65–99)
GLUCOSE-CAPILLARY: 157 mg/dL — AB (ref 65–99)
GLUCOSE-CAPILLARY: 161 mg/dL — AB (ref 65–99)
GLUCOSE-CAPILLARY: 178 mg/dL — AB (ref 65–99)
GLUCOSE-CAPILLARY: 185 mg/dL — AB (ref 65–99)
GLUCOSE-CAPILLARY: 62 mg/dL — AB (ref 65–99)
Glucose-Capillary: 31 mg/dL — CL (ref 65–99)
Glucose-Capillary: 37 mg/dL — CL (ref 65–99)

## 2015-07-19 LAB — EXPECTORATED SPUTUM ASSESSMENT W GRAM STAIN, RFLX TO RESP C

## 2015-07-19 LAB — PROTIME-INR
INR: 5.16 — AB
Prothrombin Time: 46 seconds — ABNORMAL HIGH (ref 11.4–15.0)

## 2015-07-19 MED ORDER — PUREFLOW DIALYSIS SOLUTION
INTRAVENOUS | Status: DC
  Administered 2015-07-19 (×3): via INTRAVENOUS_CENTRAL
  Administered 2015-07-20: 3 via INTRAVENOUS_CENTRAL
  Administered 2015-07-20: 03:00:00 via INTRAVENOUS_CENTRAL

## 2015-07-19 MED ORDER — DEXTROSE 50 % IV SOLN
INTRAVENOUS | Status: AC
  Administered 2015-07-19: 17:00:00
  Filled 2015-07-19: qty 50

## 2015-07-19 MED ORDER — SODIUM CHLORIDE 0.9 % IV SOLN
Freq: Once | INTRAVENOUS | Status: AC
  Administered 2015-07-19: 12:00:00 via INTRAVENOUS

## 2015-07-19 MED ORDER — ONDANSETRON HCL 4 MG/2ML IJ SOLN: 4.0000 mg | Freq: Four times a day (QID) | INTRAMUSCULAR | Status: DC | PRN

## 2015-07-19 MED ORDER — HEPARIN SODIUM (PORCINE) 1000 UNIT/ML DIALYSIS: 1000.0000 [IU] | INTRAMUSCULAR | Status: DC | PRN

## 2015-07-19 MED ORDER — ATORVASTATIN CALCIUM 20 MG PO TABS
80.0000 mg | ORAL_TABLET | Freq: Every day | ORAL | Status: DC
  Administered 2015-07-19: 80 mg
  Filled 2015-07-19 (×2): qty 4

## 2015-07-19 MED ORDER — DEXTROSE 50 % IV SOLN
1.0000 | Freq: Once | INTRAVENOUS | Status: AC
Start: 2015-07-19 — End: 2015-07-19
  Administered 2015-07-19: 50 mL via INTRAVENOUS

## 2015-07-19 MED ORDER — DEXTROSE 10 % IV SOLN
INTRAVENOUS | Status: DC
  Administered 2015-07-19: 20:00:00 via INTRAVENOUS

## 2015-07-19 MED ORDER — ACETAMINOPHEN 325 MG PO TABS: 650.0000 mg | ORAL_TABLET | Freq: Four times a day (QID) | ORAL | Status: DC | PRN

## 2015-07-19 MED ORDER — ACETAMINOPHEN 650 MG RE SUPP: 650.0000 mg | Freq: Four times a day (QID) | RECTAL | Status: DC | PRN

## 2015-07-19 MED ORDER — DEXTROSE 50 % IV SOLN
INTRAVENOUS | Status: AC
  Filled 2015-07-19: qty 50

## 2015-07-19 MED ORDER — AMIODARONE IV BOLUS ONLY 150 MG/100ML
150.0000 mg | Freq: Once | INTRAVENOUS | Status: AC
Start: 2015-07-19 — End: 2015-07-19
  Administered 2015-07-19: 150 mg via INTRAVENOUS
  Filled 2015-07-19: qty 100

## 2015-07-19 MED ORDER — FAMOTIDINE IN NACL 20-0.9 MG/50ML-% IV SOLN: 20.0000 mg | INTRAVENOUS | Status: DC

## 2015-07-19 MED ORDER — DEXTROSE 50 % IV SOLN
25.0000 g | INTRAVENOUS | Status: AC
  Administered 2015-07-19: 25 g via INTRAVENOUS

## 2015-07-19 MED FILL — Medication: Qty: 1 | Status: AC

## 2015-07-19 NOTE — Consult Note (Signed)
Pharmacy Antibiotic Note  John Robinson is a 82 y.o. male admitted on 06/20/2015 with pneumonia. MD notes dysphagia and concern for aspiration PNA. Pharmacy has been consulted for C diff management. Hemodialysis patient transitioned to CRRT.    Plan: Will continue current orders for Metronidazole 500mg  IV Q8hr and Vancomycin 500 mg PO q6h. Vancomycin enema added per ID. Patient is currently on day 9 of therapy. Will f/u planned duration of therapy.   Height: 6' (182.9 cm) Weight: 182 lb 12.2 oz (82.9 kg) IBW/kg (Calculated) : 77.6  Temp (24hrs), Avg:98.5 F (36.9 C), Min:96.3 F (35.7 C), Max:99.3 F (37.4 C)   Recent Labs Lab 07/14/15 1730 07/15/15 0628 07/16/15 0500 07/17/15 0445 07/17/15 1445 07/17/15 1703 07/17/15 2054 07/18/15 0400 07/18/15 1334 07/19/15 0550 07/19/15 0934  WBC  --  44.8* 44.2* 30.3*  --   --   --  31.2*  --  21.7*  --   CREATININE 4.84* 5.48* 6.22* 6.86*  --   --  7.67* 7.50* 5.13* 6.14* 6.03*  LATICACIDVEN 2.2*  --   --   --  6.2* 7.2*  --   --   --   --   --     Estimated Creatinine Clearance: 10.5 mL/min (by C-G formula based on Cr of 6.03).    Allergies  Allergen Reactions  . Pletal [Cilostazol] Other (See Comments)    Reaction:  Dizziness    Antimicrobials this admission: vancomycin 6/26 >> 6/27 Azithromycin 6/26 >> 6/27 cefepime 6/26 >> 6/28 Metronidazole 6/27 >> Vancomycin PO 6/27 >>  Vancomycin enema 7/3 >>   Dose adjustments this admission:  Microbiology results: 6/26 BCx: NG 6/26 cdiff: positive  6/27 Sputum Cx: pending  6/27 MRSA PCR: positive 7/3 SCx: pending  Pharmacy will continue to monitor and adjust per consult.    Ulice Dash, PharmD Clinical Pharmacist   07/19/2015 11:53 AM

## 2015-07-19 NOTE — Progress Notes (Signed)
Nutrition Follow-up  DOCUMENTATION CODES:   Severe malnutrition in context of acute illness/injury  INTERVENTION:  -Discussed nutritional poc during ICU rounds with MD Mungal; per MD order, bowel rest today, continue to hold TF, OG to LIS; continue to assess   NUTRITION DIAGNOSIS:   Malnutrition related to acute illness as evidenced by severe depletion of body fat, severe depletion of muscle mass.  Continues  GOAL:   Provide needs based on ASPEN/SCCM guidelines  MONITOR:   Vent status, Weight trends, Labs, I & O's, TF tolerance  REASON FOR ASSESSMENT:   Consult Poor PO  ASSESSMENT:    Pt remains on vent, Starting CRRT today  Pt with episode of emesis during the night per report and TF on hold. OG tube in stomach, clamped. Rectal tube in place with 400 mL stool documented in 24 hours  Diet Order:  Diet NPO time specified  Skin:   (pressure ulcer on coccyx)  Last BM:  7/4 rectal tube in place, 400 mL in 24 hours per I/O    Labs: reviewed  Meds: acidophilus, ss novolog, lantus  Height:   Ht Readings from Last 1 Encounters:  06/20/2015 6' (1.829 m)    Weight:   Wt Readings from Last 1 Encounters:  07/19/15 182 lb 12.2 oz (82.9 kg)    Ideal Body Weight:     BMI:  Body mass index is 24.78 kg/(m^2).  Estimated Nutritional Needs:   Kcal:  1802kcals, (Ve: 14.9, Tmax: 36.9, BEE: 1448kcals)  Protein:  142-178g protein  Fluid:  UOP+1L  EDUCATION NEEDS:   No education needs identified at this time  Kerman Passey [place], [place], LDN [phone] Pager  [phone] Weekend/On-Call Pager

## 2015-07-19 NOTE — Progress Notes (Signed)
Pt not tolerating tube feeds. eLink MD informed and orders to hold feeds until MD in AM can reassess. Pt's blood sugar 161 and MD notified per previous order.

## 2015-07-19 NOTE — Progress Notes (Signed)
Subjective:  Patient remains critically ill. He did not tolerate dialysis very well yesterday. Discussed care with the patient's wife. She is agreeable to proceeding with continuous renal replacement therapy.   Objective:  Vital signs in last 24 hours:  Temp:  [97.2 F (36.2 C)-99.3 F (37.4 C)] 98.1 F (36.7 C) (07/05 1000) Pulse Rate:  [25-136] 40 (07/05 0800) Resp:  [15-25] 24 (07/05 1000) BP: (40-174)/(19-121) 96/64 mmHg (07/05 1000) SpO2:  [65 %-99 %] 93 % (07/05 1000) FiO2 (%):  [75 %-80 %] 75 % (07/05 0810) Weight:  [82.9 kg (182 lb 12.2 oz)] 82.9 kg (182 lb 12.2 oz) (07/05 0500)  Weight change:  Filed Weights   07/11/15 1850 07/13/15 1045 07/19/15 0500  Weight: 72.3 kg (159 lb 6.3 oz) 71.4 kg (157 lb 6.5 oz) 82.9 kg (182 lb 12.2 oz)    Intake/Output:    Intake/Output Summary (Last 24 hours) at 07/19/15 1102 Last data filed at 07/19/15 1000  Gross per 24 hour  Intake 3894.71 ml  Output   -511 ml  Net 4405.71 ml     Physical Exam: General: Critically ill appearing  HEENT anicteric  Neck supple  Pulm/lungs B/l crackles, on the vent  CVS/Heart irregular, soft systolic murmur  Abdomen:  Decreased distension, BS present  Extremities: No peripheral edema  Neurologic: On the ventilator, not following commands  Skin: B/l UE ecchymoses  Access: Left IJ PermCath       Basic Metabolic Panel:   Recent Labs Lab 07/14/15 1730 07/15/15 0628  07/17/15 0445 07/17/15 2054 07/18/15 07/18/15 0400 07/18/15 1334 07/19/15 0550 07/19/15 0934  NA 139 138  < > 135 137  --  138 136 135 135  K 4.5 5.5*  < > 5.6* 7.1* 6.5* 6.4* 4.7 4.7 4.5  CL 104 103  < > 103 102  --  102 98* 95* 98*  CO2 25 25  < > 21* 15*  --  18* 22 24 22   GLUCOSE 209* 304*  < > 158* 119*  --  219* 177* 199* 177*  BUN 54* 66*  < > 92* 99*  --  99* 60* 71* 70*  CREATININE 4.84* 5.48*  < > 6.86* 7.67*  --  7.50* 5.13* 6.14* 6.03*  CALCIUM 7.6* 7.8*  < > 7.6* 7.8*  --  8.1* 8.1* 7.3* 7.3*  MG 2.1  2.3  --  2.8*  --   --  3.0*  --  2.5*  --   PHOS 6.3* 7.1*  --  8.7*  --   --  11.4*  --  8.4* 7.5*  < > = values in this interval not displayed.   CBC:  Recent Labs Lab 07/15/15 0628 07/16/15 0500 07/17/15 0445 07/18/15 0400 07/19/15 0550  WBC 44.8* 44.2* 30.3* 31.2* 21.7*  NEUTROABS  --   --   --  28.7*  --   HGB 12.8* 13.0 12.6* 12.1* 11.1*  HCT 41.3 39.8* 39.2* 37.7* 33.5*  MCV 89.7 87.0 88.8 88.6 88.3  PLT 151 112* 77* 68* 47*      Microbiology:  Recent Results (from the past 720 hour(s))  C difficile quick scan w PCR reflex     Status: Abnormal   Collection Time: 07/02/2015  1:45 AM  Result Value Ref Range Status   C Diff antigen POSITIVE (A) NEGATIVE Final   C Diff toxin POSITIVE (A) NEGATIVE Final   C Diff interpretation   Final    Positive for toxigenic C. difficile, active toxin production  present.    Comment: CRITICAL RESULT CALLED TO, READ BACK BY AND VERIFIED WITH:  JACK DOUGHERTY AT 1458 07/11/15 SDR   Blood culture (routine x 2)     Status: None   Collection Time: 07/14/2015  2:10 PM  Result Value Ref Range Status   Specimen Description BLOOD RIGHT ASSIST CONTROL  Final   Special Requests BOTTLES DRAWN AEROBIC AND ANAEROBIC [place]  Final   Culture NO GROWTH 5 DAYS  Final   Report Status 07/15/2015 FINAL  Final  Blood culture (routine x 2)     Status: None   Collection Time: 07/06/2015  2:10 PM  Result Value Ref Range Status   Specimen Description BLOOD RIGHT HAND  Final   Special Requests   Final    BOTTLES DRAWN AEROBIC AND ANAEROBIC [place]   Culture NO GROWTH 5 DAYS  Final   Report Status 07/15/2015 FINAL  Final  MRSA PCR Screening     Status: Abnormal   Collection Time: 07/11/15  7:06 PM  Result Value Ref Range Status   MRSA by PCR POSITIVE (A) NEGATIVE Final    Comment:        The GeneXpert MRSA Assay (FDA approved for NASAL specimens only), is one component of a comprehensive MRSA colonization surveillance program. It is  not intended to diagnose MRSA infection nor to guide or monitor treatment for MRSA infections. READ BACK AND VERIFIED BY MARCELLA TURNER AT 2120 07/11/2015.  TFK   Culture, expectorated sputum-assessment     Status: None   Collection Time: 07/17/15  3:50 PM  Result Value Ref Range Status   Specimen Description TRACHEAL ASPIRATE  Final   Special Requests Normal  Final   Sputum evaluation   Final    Sputum specimen not acceptable for testing.  Please recollect.   CALLED [facility] 07/17/15 @ [phone]  [place]   Report Status 07/17/2015 FINAL  Final  Culture, expectorated sputum-assessment     Status: None   Collection Time: 07/17/15  3:50 PM  Result Value Ref Range Status   Specimen Description SPUTUM  Final   Special Requests NONE  Final   Sputum evaluation THIS SPECIMEN IS ACCEPTABLE FOR SPUTUM CULTURE  Final   Report Status 07/17/2015 FINAL  Final  Culture, respiratory (NON-Expectorated)     Status: None (Preliminary result)   Collection Time: 07/17/15  3:50 PM  Result Value Ref Range Status   Specimen Description SPUTUM  Final   Special Requests NONE Reflexed from [id number]  Final   Gram Stain   Final    FEW WBC PRESENT,BOTH PMN AND MONONUCLEAR FEW SQUAMOUS EPITHELIAL CELLS PRESENT MODERATE GRAM POSITIVE RODS FEW YEAST RARE GRAM NEGATIVE RODS Performed at [facility]    Culture PENDING  Incomplete   Report Status PENDING  Incomplete    Coagulation Studies:  Recent Labs  07/17/15 0445 07/18/15 0400 07/19/15 0550  LABPROT 38.3* 50.0* 46.0*  INR 4.04* 5.76* 5.16*    Urinalysis: No results for input(s): COLORURINE, LABSPEC, PHURINE, GLUCOSEU, HGBUR, BILIRUBINUR, KETONESUR, PROTEINUR, UROBILINOGEN, NITRITE, LEUKOCYTESUR in the last 72 hours.  Invalid input(s): APPERANCEUR    Imaging: Dg Chest 1 View  07/18/2015  CLINICAL DATA:  Acute respiratory failure. EXAM: CHEST 1 VIEW COMPARISON:  07/17/2015. FINDINGS: Tubes and lines are in good position, stable. LEFT lower  lobe atelectasis with effusion, unchanged. No pneumothorax. IMPRESSION: No active disease. Electronically Signed   By: Staci Righter M.D.   On: 07/18/2015 07:31   Dg Abd 1 View  07/17/2015  CLINICAL DATA:  Post OG tube placement EXAM: ABDOMEN - 1 VIEW COMPARISON:  CT 07/16/2015 FINDINGS: Enteric tube has been placed with the tip in the distal stomach. Mild gaseous distention of the stomach. Prior stent graft repair of abdominal aortic aneurysm. Gas throughout mildly prominent large and small bowel loops. IMPRESSION: NG tube tip in the distal stomach. Electronically Signed   By: Rolm Baptise M.D.   On: 07/17/2015 13:29   Ct Head Wo Contrast  07/17/2015  CLINICAL DATA:  Postop cardiac arrest EXAM: CT HEAD WITHOUT CONTRAST TECHNIQUE: Contiguous axial images were obtained from the base of the skull through the vertex without intravenous contrast. COMPARISON:  11/28/2013 FINDINGS: Brain: No intracranial hemorrhage, mass effect or midline shift. Stable cerebral atrophy. Stable mild periventricular chronic white matter disease. No definite acute cortical infarction. No mass lesion is noted on this unenhanced scan. Ventricular size is stable from prior exam. Vascular: Mild atherosclerotic calcifications of carotid siphon. Skull: No skull fracture is noted. Sinuses/Orbits: Paranasal sinuses are unremarkable. Stable postsurgical changes left mastoid. Some fluid in left mastoid air cells again noted. Other: None IMPRESSION: No acute intracranial abnormality. No definite acute cortical infarction. Stable atrophy and chronic white matter disease. Electronically Signed   By: Lahoma Crocker M.D.   On: 07/17/2015 14:05   Dg Chest Port 1 View  07/19/2015  CLINICAL DATA:  Hypoxia EXAM: PORTABLE CHEST 1 VIEW COMPARISON:  July 18, 2015 FINDINGS: Endotracheal tube tip is 5.0 cm above the carina. Right jugular catheter tip is at the cavoatrial junction. Left subclavian catheter tip is in the superior vena cava near the cavoatrial  junction. Nasogastric tube tip and side port are in the stomach. No pneumothorax evident. There are pleural effusions bilaterally, larger on the right than on the left. There is atelectatic change in the bases. Lungs elsewhere clear. Heart is upper normal in size with pulmonary vascularity within normal limits. There is atherosclerotic calcification in the aorta. Patient is status post coronary artery bypass grafting. IMPRESSION: Tube and catheter positions as described. Bilateral pleural effusions with bibasilar atelectasis. Suspect a degree of underlying congestive heart failure. Aortic atherosclerosis present. Electronically Signed   By: Lowella Grip III M.D.   On: 07/19/2015 07:13   Dg Chest Port 1 View  07/17/2015  CLINICAL DATA:  Post intubation EXAM: PORTABLE CHEST 1 VIEW COMPARISON:  07/16/2015 FINDINGS: Cardiomediastinal silhouette is stable. Endotracheal tube in place with tip 7.8 cm above the carina. NG tube in place with tip in distal stomach. Stable patchy airspace opacification right middle lobe. Trace left pleural effusion with left basilar atelectasis. Small amount of fluid is noted in right minor fissure. There is no pneumothorax. Stable right IJ central line position. Stable dual lumen left IJ central line position. No pneumothorax IMPRESSION: Endotracheal tube in place. No pneumothorax. Small left pleural effusion with left basilar atelectasis. Trace fluid noted in right minor fissure. No pulmonary edema. Stable patchy airspace opacification in right middle lobe. Electronically Signed   By: Lahoma Crocker M.D.   On: 07/17/2015 13:33     Medications:   . DOPamine Stopped (07/18/15 1730)  . fentaNYL infusion INTRAVENOUS 200 mcg/hr (07/19/15 0125)  . norepinephrine (LEVOPHED) Adult infusion 16 mcg/min (07/19/15 0908)  . pureflow 2,500 mL/hr at 07/19/15 1010  .  sodium bicarbonate  infusion 1000 mL 75 mL/hr at 07/19/15 0226  . vasopressin (PITRESSIN) infusion - *FOR SHOCK* 0.03 Units/min  (07/19/15 0719)   . sodium chloride   Intravenous Once  .  acidophilus  1 capsule Oral BID  . antiseptic oral rinse  7 mL Mouth Rinse 10 times per day  . atorvastatin  80 mg Oral QHS  . budesonide (PULMICORT) nebulizer solution  0.25 mg Nebulization BID  . chlorhexidine gluconate (SAGE KIT)  15 mL Mouth Rinse BID  . famotidine (PEPCID) IV  20 mg Intravenous Q24H  . fentaNYL (SUBLIMAZE) injection  50 mcg Intravenous Once  . fentaNYL (SUBLIMAZE) injection  50 mcg Intravenous Once  . insulin aspart  0-20 Units Subcutaneous TID WC  . insulin glargine  20 Units Subcutaneous QHS  . ipratropium-albuterol  3 mL Nebulization Q4H  . metronidazole  500 mg Intravenous Q8H  . vancomycin  500 mg Oral Q6H  . vancomycin (VANCOCIN) rectal ENEMA  500 mg Rectal Q6H  . Warfarin - Pharmacist Dosing Inpatient   Does not apply q1800   acetaminophen **OR** acetaminophen, fentaNYL, gi cocktail, heparin, lidocaine-prilocaine, midazolam, midazolam, nitroGLYCERIN, ondansetron (ZOFRAN) IV, traMADol  Assessment/ Plan:  82 y.o. male with end-stage renal disease, hemodialysis [address], Tuesday, Thursday, Saturday, history of severe vascular disease including aortic-iliac bypass,four-vessel CABG at [facility] March 2017, postop wound infection, coronary disease, history of RCA stent, COPD, C diff infection and acute respiratory failure 06/2015  1. End-stage renal disease.  TuesdayThursday/Saturday.  [address] - We will proceed with CRRT today given the fact that the patient remains hemodynamically unstable. We will use his PermCath.  2. Anemia of chronic kidney disease. Hemoglobin down to 11.1. Continue to monitor. Hold Epogen for now.  3.  Supratherapeutic INR:  INR currently 5.16. He was given FFP yesterday. Defer plan to pulmonary critical care.  4. Sepsis   Continue metronidazole and vancomycin.  5.  Secondary hyperparathyroidism Phosphorus 7.5 today. This should come down with continuous renal  placement therapy.  6. C. Difficile colitis Currently metronidazole IV, vancomycin oral  7.  Acute respiratory failure.  Remains on the vent. FiO2 currently 75%. Continue vent support.   LOS: 9 John Robinson 7/5/201711:02 AM

## 2015-07-19 NOTE — Progress Notes (Signed)
ANTICOAGULATION CONSULT NOTE - Follow up [place] for Warfarin  Indication: VTE prophylaxis  Allergies  Allergen Reactions  . Pletal [Cilostazol] Other (See Comments)    Reaction:  Dizziness    Patient Measurements: Height: 6' (182.9 cm) Weight: 182 lb 12.2 oz (82.9 kg) IBW/kg (Calculated) : 77.6  Vital Signs: Temp: 96.4 F (35.8 C) (07/05 1142) Temp Source: Other (Comment) (07/05 0000) BP: 92/52 mmHg (07/05 1100) Pulse Rate: 40 (07/05 0800)  Labs:  Recent Labs  07/17/15 0445  07/17/15 1549 07/17/15 2054 07/18/15 0249 07/18/15 0400 07/18/15 1334 07/19/15 0550 07/19/15 0934  HGB 12.6*  --   --   --   --  12.1*  --  11.1*  --   HCT 39.2*  --   --   --   --  37.7*  --  33.5*  --   PLT 77*  --   --   --   --  68*  --  47*  --   LABPROT 38.3*  --   --   --   --  50.0*  --  46.0*  --   INR 4.04*  --   --   --   --  5.76*  --  5.16*  --   CREATININE 6.86*  --   --  7.67*  --  7.50* 5.13* 6.14* 6.03*  TROPONINI 0.13*  < > 0.36* 0.67* 0.94*  --   --   --   --   < > = values in this interval not displayed.  Estimated Creatinine Clearance: 10.5 mL/min (by C-G formula based on Cr of 6.03).  Assessment: Pharmacy consulted to dose warfarin in this 82 year old male admitted with PNA and Cdiff. Currently also on metronidazole and oral vancomycin.  Pt was on warfarin 5 mg PO QHS at home.  INR on 6/26 was 4.25.   6/28 INR  2.95  Warfarin 2 mg 6/29 INR  3.78  none 6/30 INR  4.06  none 7/1   INR  3.45  none 7/2   INR  3.29  none 7/3   INR  4.04  None 7/4   INR  5.76  None 7/5   INR 5.16  Goal of Therapy:  INR 2-3   Plan:  INR remains supratherapeutic. Will continue to hold Coumadin and f/u AM INR.   Napoleon Form, RPh Clinical Pharmacist 07/19/2015,11:43 AM

## 2015-07-19 NOTE — Progress Notes (Signed)
[facility] NOTE  Pharmacy Consult for Renal dosing of medications Indication: CRRT   Allergies  Allergen Reactions  . Pletal [Cilostazol] Other (See Comments)    Reaction:  Dizziness    Patient Measurements: Height: 6' (182.9 cm) Weight: 182 lb 12.2 oz (82.9 kg) IBW/kg (Calculated) : 77.6   Vital Signs: Temp: 96.4 F (35.8 C) (07/05 1142) Temp Source: Other (Comment) (07/05 0000) BP: 92/52 mmHg (07/05 1100) Pulse Rate: 40 (07/05 0800) Intake/Output from previous day: 07/04 0701 - 07/05 0700 In: 4263.7 [P.O.:3; I.V.:3045.7; NG/GT:355; IV Piggyback:350] Out: -411 [Stool:400] Intake/Output from this shift: Total I/O In: 481.2 [I.V.:481.2] Out: 25 [Other:25] Vent settings for last 24 hours: Vent Mode:  [-] PRVC FiO2 (%):  [75 %-80 %] 75 % Set Rate:  [24 bmp] 24 bmp Vt Set:  [620 mL] 620 mL PEEP:  [5 cmH20] 5 cmH20  Labs:  Recent Labs  07/17/15 0445  07/18/15 0400 07/18/15 1334 07/19/15 0550 07/19/15 0934  WBC 30.3*  --  31.2*  --  21.7*  --   HGB 12.6*  --  12.1*  --  11.1*  --   HCT 39.2*  --  37.7*  --  33.5*  --   PLT 77*  --  68*  --  47*  --   INR 4.04*  --  5.76*  --  5.16*  --   CREATININE 6.86*  < > 7.50* 5.13* 6.14* 6.03*  MG 2.8*  --  3.0*  --  2.5* 2.4  PHOS 8.7*  --  11.4*  --  8.4* 7.5*  ALBUMIN  --   --   --   --   --  1.7*  < > = values in this interval not displayed. Estimated Creatinine Clearance: 10.5 mL/min (by C-G formula based on Cr of 6.03).   Recent Labs  07/19/15 0425 07/19/15 0711 07/19/15 1142  GLUCAP 178* 185* 157*    Microbiology: Recent Results (from the past 720 hour(s))  C difficile quick scan w PCR reflex     Status: Abnormal   Collection Time: 06/29/2015  1:45 AM  Result Value Ref Range Status   C Diff antigen POSITIVE (A) NEGATIVE Final   C Diff toxin POSITIVE (A) NEGATIVE Final   C Diff interpretation   Final    Positive for toxigenic C. difficile, active toxin production present.   Comment: CRITICAL RESULT CALLED TO, READ BACK BY AND VERIFIED WITH:  JACK DOUGHERTY AT 4709 07/11/15 SDR   Blood culture (routine x 2)     Status: None   Collection Time: 06/21/2015  2:10 PM  Result Value Ref Range Status   Specimen Description BLOOD RIGHT ASSIST CONTROL  Final   Special Requests BOTTLES DRAWN AEROBIC AND ANAEROBIC [place]  Final   Culture NO GROWTH 5 DAYS  Final   Report Status 07/15/2015 FINAL  Final  Blood culture (routine x 2)     Status: None   Collection Time: 07/05/2015  2:10 PM  Result Value Ref Range Status   Specimen Description BLOOD RIGHT HAND  Final   Special Requests   Final    BOTTLES DRAWN AEROBIC AND ANAEROBIC [place]   Culture NO GROWTH 5 DAYS  Final   Report Status 07/15/2015 FINAL  Final  MRSA PCR Screening     Status: Abnormal   Collection Time: 07/11/15  7:06 PM  Result Value Ref Range Status   MRSA by PCR POSITIVE (A) NEGATIVE Final    Comment:  The GeneXpert MRSA Assay (FDA approved for NASAL specimens only), is one component of a comprehensive MRSA colonization surveillance program. It is not intended to diagnose MRSA infection nor to guide or monitor treatment for MRSA infections. READ BACK AND VERIFIED BY MARCELLA TURNER AT 2120 07/11/2015.  TFK   Culture, expectorated sputum-assessment     Status: None   Collection Time: 07/17/15  3:50 PM  Result Value Ref Range Status   Specimen Description TRACHEAL ASPIRATE  Final   Special Requests Normal  Final   Sputum evaluation   Final    Sputum specimen not acceptable for testing.  Please recollect.   CALLED [facility] 07/17/15 @ [phone]  [place]   Report Status 07/17/2015 FINAL  Final  Culture, expectorated sputum-assessment     Status: None   Collection Time: 07/17/15  3:50 PM  Result Value Ref Range Status   Specimen Description SPUTUM  Final   Special Requests NONE  Final   Sputum evaluation THIS SPECIMEN IS ACCEPTABLE FOR SPUTUM CULTURE  Final   Report Status 07/17/2015  FINAL  Final  Culture, respiratory (NON-Expectorated)     Status: None (Preliminary result)   Collection Time: 07/17/15  3:50 PM  Result Value Ref Range Status   Specimen Description SPUTUM  Final   Special Requests NONE Reflexed from [id number]  Final   Gram Stain   Final    FEW WBC PRESENT,BOTH PMN AND MONONUCLEAR FEW SQUAMOUS EPITHELIAL CELLS PRESENT MODERATE GRAM POSITIVE RODS FEW YEAST RARE GRAM NEGATIVE RODS Performed at [facility]    Culture PENDING  Incomplete   Report Status PENDING  Incomplete    Medications:  Scheduled:  . sodium chloride   Intravenous Once  . sodium chloride   Intravenous Once  . acidophilus  1 capsule Oral BID  . antiseptic oral rinse  7 mL Mouth Rinse 10 times per day  . atorvastatin  80 mg Oral QHS  . budesonide (PULMICORT) nebulizer solution  0.25 mg Nebulization BID  . chlorhexidine gluconate (SAGE KIT)  15 mL Mouth Rinse BID  . [START ON 07/21/2015] famotidine (PEPCID) IV  20 mg Intravenous Q48H  . fentaNYL (SUBLIMAZE) injection  50 mcg Intravenous Once  . fentaNYL (SUBLIMAZE) injection  50 mcg Intravenous Once  . insulin aspart  0-20 Units Subcutaneous TID WC  . insulin glargine  20 Units Subcutaneous QHS  . ipratropium-albuterol  3 mL Nebulization Q4H  . metronidazole  500 mg Intravenous Q8H  . vancomycin  500 mg Oral Q6H  . vancomycin (VANCOCIN) rectal ENEMA  500 mg Rectal Q6H  . Warfarin - Pharmacist Dosing Inpatient   Does not apply q1800   Infusions:  . DOPamine Stopped (07/18/15 1730)  . fentaNYL infusion INTRAVENOUS 200 mcg/hr (07/19/15 0125)  . norepinephrine (LEVOPHED) Adult infusion 16 mcg/min (07/19/15 0908)  . pureflow 2,500 mL/hr at 07/19/15 1010  .  sodium bicarbonate  infusion 1000 mL 75 mL/hr at 07/19/15 0226  . vasopressin (PITRESSIN) infusion - *FOR SHOCK* 0.03 Units/min (07/19/15 0719)    Assessment: Pharmacy consulted to assist in renally dosing medications in this 82 y/o M with sepsis from C diff and ESRD  transitioning from HD to CRRT due to hemodynamic instability.   Plan:  Will change famotidine to q 48 hours. No further medications require adjustment at present. Will continue to monitor and adjust per protocol.   Ulice Dash D 07/19/2015,11:49 AM

## 2015-07-19 NOTE — Progress Notes (Signed)
PULMONARY / CRITICAL CARE MEDICINE   Name: Bennet Spector MRN: [id number] DOB: Apr 09, 1932    ADMISSION DATE:  06/30/2015 CONSULTATION DATE: 07/17/15  REFERRING MD:  Sherley Bounds  CHIEF COMPLAINT:  Acute Respiratory failure   PATIENT PROFILE: 82 year old male severe debility, ESRD and deconditioning, status post long hospital admission for CABG, complicated by sternal wound infection. Went home one week ago after 3 months of being in medical facilities, returned with C. difficile with sepsis. Patient noted to have gasping respirations and appeared to be in respiratory distress, therefore decided to intubate the patient.  Patient had brieif loss of pulse, ACLS was initiated with ROSC. Advanced airway placed and the patient is mechanically ventilated.  Now DNR  SUBJECTIVE: Remains intubated and sedated with episodes of agitation. Tube feeds held due to intolerance. Had HD 7/4 but pressors and fluid boluses had to be given during the course of the treatment.  K down to 4.5(6.4).  INR still elevated at 5.76.  VITAL SIGNS: BP 92/52 mmHg  Pulse 40  Temp(Src) 96.3 F (35.7 C) (Other (Comment))  Resp 24  Ht 6' (1.829 m)  Wt 182 lb 12.2 oz (82.9 kg)  BMI 24.78 kg/m2  SpO2 93%  HEMODYNAMICS: CVP:  [8 mmHg-11 mmHg] 8 mmHg  VENTILATOR SETTINGS: Vent Mode:  [-] PRVC FiO2 (%):  [75 %-80 %] 75 % Set Rate:  [24 bmp] 24 bmp Vt Set:  [id number] mL] 620 mL PEEP:  [5 cmH20] 5 cmH20  INTAKE / OUTPUT: I/O last 3 completed shifts: In: 5597.2 [P.O.:6; I.V.:4016.2; Other:510; NG/GT:355; IV Piggyback:710] Out: -411 [Stool:400]  PHYSICAL EXAMINATION: General: acutely ill looking, sedated Neuro: Agitated with least stimulation; moves all extremities HEENT: Atraumatic, normocephalic, PERRLA, oral mucosa moist Cardiovascular: HR irregular-irregular, S1S2, no MRG noted Lungs: coarse and crackly throughout, symmetrical chest expansion. ETT with minimal secretions Abdomen:  Distended, hypoactive bowel  sounds, tender on light palpation Musculoskeletal:  No inflammation/ deformity noted Skin:   Mid sternal surgical incision, dressing CDI.  LABS:  BMET  Recent Labs Lab 07/18/15 1334 07/19/15 0550 07/19/15 0934  NA 136 135 135  K 4.7 4.7 4.5  CL 98* 95* 98*  CO2 22 24 22   BUN 60* 71* 70*  CREATININE 5.13* 6.14* 6.03*  GLUCOSE 177* 199* 177*    Electrolytes  Recent Labs Lab 07/17/15 0445  07/18/15 0400 07/18/15 1334 07/19/15 0550 07/19/15 0934  CALCIUM 7.6*  < > 8.1* 8.1* 7.3* 7.3*  MG 2.8*  --  3.0*  --  2.5*  --   PHOS 8.7*  --  11.4*  --  8.4* 7.5*  < > = values in this interval not displayed.  CBC  Recent Labs Lab 07/17/15 0445 07/18/15 0400 07/19/15 0550  WBC 30.3* 31.2* 21.7*  HGB 12.6* 12.1* 11.1*  HCT 39.2* 37.7* 33.5*  PLT 77* 68* 47*    Coag's  Recent Labs Lab 07/17/15 0445 07/18/15 0400 07/19/15 0550  INR 4.04* 5.76* 5.16*    Sepsis Markers  Recent Labs Lab 07/14/15 1730  07/17/15 1445 07/17/15 1703 07/18/15 0400 07/19/15 0550  LATICACIDVEN 2.2*  --  6.2* 7.2*  --   --   PROCALCITON 2.30  < > 1.70  --  3.90 5.06  < > = values in this interval not displayed.  ABG  Recent Labs Lab 07/14/15 1800 07/17/15 1613 07/18/15 0148  PHART 7.32* 7.22* 7.30*  PCO2ART 48 28* 30*  PO2ART  --  109* 77*    Liver Enzymes  Recent Labs Lab  07/19/15 0934  ALBUMIN 1.7*    Cardiac Enzymes  Recent Labs Lab 07/17/15 1549 07/17/15 2054 07/18/15 0249  TROPONINI 0.36* 0.67* 0.94*    Glucose  Recent Labs Lab 07/18/15 1333 07/18/15 1609 07/18/15 2008 07/18/15 2354 07/19/15 0425 07/19/15 0711  GLUCAP 139* 163* 150* 161* 178* 185*    Imaging Dg Chest Port 1 View  07/19/2015  CLINICAL DATA:  Hypoxia EXAM: PORTABLE CHEST 1 VIEW COMPARISON:  July 18, 2015 FINDINGS: Endotracheal tube tip is 5.0 cm above the carina. Right jugular catheter tip is at the cavoatrial junction. Left subclavian catheter tip is in the superior vena cava  near the cavoatrial junction. Nasogastric tube tip and side port are in the stomach. No pneumothorax evident. There are pleural effusions bilaterally, larger on the right than on the left. There is atelectatic change in the bases. Lungs elsewhere clear. Heart is upper normal in size with pulmonary vascularity within normal limits. There is atherosclerotic calcification in the aorta. Patient is status post coronary artery bypass grafting. IMPRESSION: Tube and catheter positions as described. Bilateral pleural effusions with bibasilar atelectasis. Suspect a degree of underlying congestive heart failure. Aortic atherosclerosis present. Electronically Signed   By: Lowella Grip III M.D.   On: 07/19/2015 07:13     STUDIES:  7/3 CT head without contrast>>No ICH 7/3 CT abdomen and pelvis>>Diffuse edematous wall thickening, consistent with the reported history of C. difficile infection.  CULTURES: 7/4 sputum culture>> 6/26 blood culture NGTD 6/26 C-Diff>> Positive  ANTIBIOTICS: vancomycin 6/26 >> 6/27 Azithromycin 6/26 >> 6/27 cefepime 6/26 >> 6/28 metronidazole 6/27 >> Vancomycin PO 6/27 >>  Vancomycin rectal 7/3>  SIGNIFICANT EVENTS 07/17/15>>Patient had brief episode of loss of pulse and ACLS started, received epi x 2, bicarb x 1 7/3>>  Patient was in respiratory distress requiring emergent intubation,thick secretions, possible aspiration 7/4> HD, elevated INR, 1u FFP, more awake, still on pressors   7/5>vomiting, TF on hold, starting CRRT  LINES/TUBES: 7/3 ET tube>> 6/30 Right internal jugular>>   ASSESSMENT / PLAN:  PULMONARY A: Acute hypoxemic respiratory failure Aspiration PNA Severe COPD/Emphysema P:   Full vent support SBT trials daily Fentanyl/Versed Routine ABG CXR daily  Nebulized ronchodilators Keep oxygen sats  greater than 88% Solu-Cortef 50 mg every 6 hours  CARDIOVASCULAR A:  Cardiopulmonary arrest possibly related to aspiration Septic shock related to  C. Difficile A. fib with RVR P:  Levoped/ vaso, wean pressors as tolerated Dopamine weaned off 7/4 Keep MAP> 65 Continue to hold anticoagulants  RENAL A:   ESRD On HD-Now on CRRT Hyperkalemia-improved P:   Nephrology following Monitor and correct electrolyte imbalances Continue CRRT per nephrology  GASTROINTESTINAL A:   C difficile colitis P:   Continue Flagyl and oral vancomycin and rectal vancomycin for C. difficile sepsis.  HEMATOLOGIC A:   H/O Severe peripheral vascular disease CABG with stenting, on anticoagulation ELEVATED INR P:  PT/INR daily Transfuse FFP as ordered  INFECTIOUS A:   Leukocytosis with increasing white cell count, due to C. difficile with sepsis and steroids P:   ID following Continue Flagyl and vancomycin  Monitor procalcitonin level Follow cultures  ENDOCRINE Hyperglycemia-steroid induced  P:  Continue Levemir to 20 units daily at bedtime, and continue NovoLog correction scale. Blood sugar checks every 4 hours  NEUROLOGIC Metabolic encephalopathy P:   RASS goal 0- -1  CT head>> 7/3 No acute intracranial abnormality. Monitor mental status and treat agitation   FAMILY update: Wife at bedside. Updated on patient's current status  CODE STATUS - DNR Total CCM time is 40 minutes  Magdalene S. [facility] ANP-BC Pulmonary and [place] Pager [phone] or [phone]  07/19/2015, 11:04 AM  STAFF NOTE: I, Dr. Vilinda Boehringer have personally reviewed patient's available data, including medical history, events of note, physical examination and test results as part of my evaluation. I have discussed with NP Patria Mane and other care providers such as pharmacist, RN and RRT.     A:82 year old male severe debility, ESRD and deconditioning, status post long hospital admission for CABG, complicated by sternal wound infection. Went home one week ago after 3 months of being in medical facilities, returned with C.  difficile with sepsis. Patient noted to have gasping respirations and appeared to be in respiratory distress, therefore decided to intubate the patient. Patient had brieif loss of pulse, ACLS was initiated with ROSC. Advanced airway placed and the patient is mechanically ventilated.   Sepsis - due to C. Diff colitis Colitis Hypotension ESRD - on crrt  Cardiac arrest Metabolic encephalopathy Acidemia COPD Afib hyperglycemia Elevated INR  P:   - patient with worsening sepsis and respiratory failure. Overall condition with slow decline - cont with abx for c diff colitis - MV, wean as tolerated - cont with CRRT - did not tolerate HD on 7/4 - had dropping BP, requiring 3rd pressor.  - stress dose steroids - 2u FFP today  - wife updated at the bedside - stated that he is a DNR, and she would like to continue with full medical treatment for another 1-2 days, then readdress goals of care.   Marland Kitchen  Rest per NP/medical resident whose note is outlined above and that I agree with  The patient is critically ill with multiple organ systems failure and requires high complexity decision making for assessment and support, frequent evaluation and titration of therapies, application of advanced monitoring technologies and extensive interpretation of multiple databases.   Critical Care Time devoted to patient care services described in this note is 40  Minutes.   This time reflects time of care of this signee Dr Vilinda Boehringer.  This critical care time does not reflect procedure time, or teaching time or supervisory time of PA/NP/Med-student/Med Resident etc but could involve care discussion time.  Vilinda Boehringer, MD [facility] Pager [phone] (please enter 7-digits) On Call Pager [phone] (please enter 7-digits)  Note: This note was prepared with Dragon dictation along with smaller phrase technology. Any transcriptional errors that result from this process are  unintentional.

## 2015-07-19 NOTE — Progress Notes (Signed)
Chaplain rounded the unit and provided a compassionate presence with silent prayer for the patient who appeared to be sleeping.  Minerva Fester [phone]

## 2015-07-19 NOTE — Progress Notes (Signed)
[facility] [facility] INFECTIOUS DISEASE PROGRESS NOTE Date of Admission:  07/13/2015     ID: Demarkus Remmel is a 82 y.o. male with C diff Active Problems:   Pneumonia   Protein-calorie malnutrition, severe   DNR (do not resuscitate) discussion   Palliative care encounter   Adult failure to thrive   Severe sepsis ([place])   Subjective: Still very ill, on pressors, started on CRRT, no BMs, vomiting with tube feeds  ROS  intubated  Medications:  Antibiotics Given (last 72 hours)    Date/Time Action Medication Dose Rate   07/16/15 1950 Given   metroNIDAZOLE (FLAGYL) IVPB 500 mg 500 mg 100 mL/hr   07/16/15 1953 Given   vancomycin (VANCOCIN) 50 mg/mL oral solution 500 mg 500 mg    07/17/15 0409 Given   metroNIDAZOLE (FLAGYL) IVPB 500 mg 500 mg 100 mL/hr   07/17/15 0410 Given   vancomycin (VANCOCIN) 50 mg/mL oral solution 500 mg 500 mg    07/17/15 0831 Given   vancomycin (VANCOCIN) 50 mg/mL oral solution 500 mg 500 mg    07/17/15 1418 Given   metroNIDAZOLE (FLAGYL) IVPB 500 mg 500 mg 100 mL/hr   07/17/15 1419 Given   vancomycin (VANCOCIN) 50 mg/mL oral solution 500 mg 500 mg    07/17/15 2036 Given   vancomycin (VANCOCIN) 500 mg in sodium chloride irrigation 0.9 % 100 mL ENEMA 500 mg    07/17/15 2037 Given   vancomycin (VANCOCIN) 50 mg/mL oral solution 500 mg 500 mg    07/17/15 2037 Given   metroNIDAZOLE (FLAGYL) IVPB 500 mg 500 mg 100 mL/hr   07/18/15 0203 Given   vancomycin (VANCOCIN) 50 mg/mL oral solution 500 mg 500 mg    07/18/15 0203 Given   vancomycin (VANCOCIN) 500 mg in sodium chloride irrigation 0.9 % 100 mL ENEMA 500 mg    07/18/15 0430 Given   metroNIDAZOLE (FLAGYL) IVPB 500 mg 500 mg 100 mL/hr   07/18/15 0730 Given   vancomycin (VANCOCIN) 50 mg/mL oral solution 500 mg 500 mg    07/18/15 0731 Given   vancomycin (VANCOCIN) 500 mg in sodium chloride irrigation 0.9 % 100 mL ENEMA 500 mg    07/18/15 1318 Given  [dialysis was running and per dialysis RN, this antibiotic  would dialyize out]   metroNIDAZOLE (FLAGYL) IVPB 500 mg 500 mg 100 mL/hr   07/18/15 1348 Given   vancomycin (VANCOCIN) 50 mg/mL oral solution 500 mg 500 mg    07/18/15 1348 Given   vancomycin (VANCOCIN) 500 mg in sodium chloride irrigation 0.9 % 100 mL ENEMA 500 mg    07/18/15 1927 Given   vancomycin (VANCOCIN) 50 mg/mL oral solution 500 mg 500 mg    07/18/15 1927 Given   metroNIDAZOLE (FLAGYL) IVPB 500 mg 500 mg 100 mL/hr   07/18/15 1927 Given   vancomycin (VANCOCIN) 500 mg in sodium chloride irrigation 0.9 % 100 mL ENEMA 500 mg    07/19/15 0135 Given   vancomycin (VANCOCIN) 50 mg/mL oral solution 500 mg 500 mg    07/19/15 0135 Given   vancomycin (VANCOCIN) 500 mg in sodium chloride irrigation 0.9 % 100 mL ENEMA 500 mg    07/19/15 0405 Given   metroNIDAZOLE (FLAGYL) IVPB 500 mg 500 mg 100 mL/hr   07/19/15 0720 Given   vancomycin (VANCOCIN) 50 mg/mL oral solution 500 mg 500 mg    07/19/15 0720 Given   vancomycin (VANCOCIN) 500 mg in sodium chloride irrigation 0.9 % 100 mL ENEMA 500 mg  07/19/15 1222 Given   metroNIDAZOLE (FLAGYL) IVPB 500 mg 500 mg 100 mL/hr   07/19/15 1356 Given   vancomycin (VANCOCIN) 50 mg/mL oral solution 500 mg 500 mg    07/19/15 1356 Given   vancomycin (VANCOCIN) 500 mg in sodium chloride irrigation 0.9 % 100 mL ENEMA 500 mg      . sodium chloride   Intravenous Once  . acidophilus  1 capsule Oral BID  . amiodarone  150 mg Intravenous Once  . antiseptic oral rinse  7 mL Mouth Rinse 10 times per day  . atorvastatin  80 mg Per Tube QHS  . budesonide (PULMICORT) nebulizer solution  0.25 mg Nebulization BID  . chlorhexidine gluconate (SAGE KIT)  15 mL Mouth Rinse BID  . [START ON 07/21/2015] famotidine (PEPCID) IV  20 mg Intravenous Q48H  . fentaNYL (SUBLIMAZE) injection  50 mcg Intravenous Once  . fentaNYL (SUBLIMAZE) injection  50 mcg Intravenous Once  . insulin aspart  0-20 Units Subcutaneous TID WC  . insulin glargine  20 Units Subcutaneous QHS  .  ipratropium-albuterol  3 mL Nebulization Q4H  . metronidazole  500 mg Intravenous Q8H  . vancomycin  500 mg Oral Q6H  . vancomycin (VANCOCIN) rectal ENEMA  500 mg Rectal Q6H    Objective: Vital signs in last 24 hours: Temp:  [96.1 F (35.6 C)-99.3 F (37.4 C)] 96.1 F (35.6 C) (07/05 1400) Pulse Rate:  [25-136] 34 (07/05 1200) Resp:  [15-25] 24 (07/05 1400) BP: (80-162)/(52-121) 95/66 mmHg (07/05 1400) SpO2:  [65 %-99 %] 95 % (07/05 1200) FiO2 (%):  [75 %-80 %] 75 % (07/05 1113) Weight:  [82.9 kg (182 lb 12.2 oz)] 82.9 kg (182 lb 12.2 oz) (07/05 0500) Constitutional: critically ill, he is awake however HENT: anicteric,  Mouth/Throat: ETT in place, OGT in place Cardiovascular: Tachy and regular Pulmonary/Chest: bibasilar rhonchi  Abdominal: distended, quiet BS, grimaces with percussion, Lymphadenopathy: He has no cervical adenopathy.  Neurological: intubated Skin: multiple bruising Ext cool and clammy, cyanosis on fingertips Psychiatric: sedate L chest wall HD cath  Rectal tube  Lab Results  Recent Labs  07/18/15 0400  07/19/15 0550 07/19/15 0934 07/19/15 1330  WBC 31.2*  --  21.7*  --   --   HGB 12.1*  --  11.1*  --   --   HCT 37.7*  --  33.5*  --   --   NA 138  < > 135 135 137  K 6.4*  < > 4.7 4.5 4.6  CL 102  < > 95* 98* 99*  CO2 18*  < > 24 22 25   BUN 99*  < > 71* 70* 58*  CREATININE 7.50*  < > 6.14* 6.03* 4.69*  < > = values in this interval not displayed.  Microbiology: Results for orders placed or performed during the hospital encounter of 06/19/2015  C difficile quick scan w PCR reflex     Status: Abnormal   Collection Time: 06/21/2015  1:45 AM  Result Value Ref Range Status   C Diff antigen POSITIVE (A) NEGATIVE Final   C Diff toxin POSITIVE (A) NEGATIVE Final   C Diff interpretation   Final    Positive for toxigenic C. difficile, active toxin production present.    Comment: CRITICAL RESULT CALLED TO, READ BACK BY AND VERIFIED WITHBarnabas Lister DOUGHERTY  AT 1017 07/11/15 SDR   Blood culture (routine x 2)     Status: None   Collection Time: 06/24/2015  2:10 PM  Result  Value Ref Range Status   Specimen Description BLOOD RIGHT ASSIST CONTROL  Final   Special Requests BOTTLES DRAWN AEROBIC AND ANAEROBIC [place]  Final   Culture NO GROWTH 5 DAYS  Final   Report Status 07/15/2015 FINAL  Final  Blood culture (routine x 2)     Status: None   Collection Time: 07/11/2015  2:10 PM  Result Value Ref Range Status   Specimen Description BLOOD RIGHT HAND  Final   Special Requests   Final    BOTTLES DRAWN AEROBIC AND ANAEROBIC [place]   Culture NO GROWTH 5 DAYS  Final   Report Status 07/15/2015 FINAL  Final  MRSA PCR Screening     Status: Abnormal   Collection Time: 07/11/15  7:06 PM  Result Value Ref Range Status   MRSA by PCR POSITIVE (A) NEGATIVE Final    Comment:        The GeneXpert MRSA Assay (FDA approved for NASAL specimens only), is one component of a comprehensive MRSA colonization surveillance program. It is not intended to diagnose MRSA infection nor to guide or monitor treatment for MRSA infections. READ BACK AND VERIFIED BY MARCELLA TURNER AT 2120 07/11/2015.  TFK   Culture, expectorated sputum-assessment     Status: None   Collection Time: 07/17/15  3:50 PM  Result Value Ref Range Status   Specimen Description TRACHEAL ASPIRATE  Final   Special Requests Normal  Final   Sputum evaluation   Final    Sputum specimen not acceptable for testing.  Please recollect.   CALLED [facility] 07/17/15 @ 7622  [place]   Report Status 07/17/2015 FINAL  Final  Culture, expectorated sputum-assessment     Status: None   Collection Time: 07/17/15  3:50 PM  Result Value Ref Range Status   Specimen Description SPUTUM  Final   Special Requests NONE  Final   Sputum evaluation THIS SPECIMEN IS ACCEPTABLE FOR SPUTUM CULTURE  Final   Report Status 07/17/2015 FINAL  Final  Culture, respiratory (NON-Expectorated)     Status: None (Preliminary  result)   Collection Time: 07/17/15  3:50 PM  Result Value Ref Range Status   Specimen Description SPUTUM  Final   Special Requests NONE Reflexed from [id number]  Final   Gram Stain   Final    FEW WBC PRESENT,BOTH PMN AND MONONUCLEAR FEW SQUAMOUS EPITHELIAL CELLS PRESENT MODERATE GRAM POSITIVE RODS FEW YEAST RARE GRAM NEGATIVE RODS    Culture   Final    CULTURE REINCUBATED FOR BETTER GROWTH Performed at [facility]    Report Status PENDING  Incomplete    Studies/Results: Dg Chest 1 View  07/18/2015  CLINICAL DATA:  Acute respiratory failure. EXAM: CHEST 1 VIEW COMPARISON:  07/17/2015. FINDINGS: Tubes and lines are in good position, stable. LEFT lower lobe atelectasis with effusion, unchanged. No pneumothorax. IMPRESSION: No active disease. Electronically Signed   By: Staci Righter M.D.   On: 07/18/2015 07:31   Dg Abd 1 View  07/19/2015  CLINICAL DATA:  Abdominal distention EXAM: ABDOMEN - 1 VIEW COMPARISON:  07/17/2015 FINDINGS: Gas is distension of the colon is stable. There is no obvious free intraperitoneal gas. There are no disproportionally dilated loops of small bowel. NG tube tip remains in the stomach with its tip at the antrum. Stent graft is stable. IMPRESSION: Stable colonic distention. Stable NG tube in the antrum of the stomach. Electronically Signed   By: Marybelle Killings M.D.   On: 07/19/2015 12:03   Dg Chest Port 1  View  07/19/2015  CLINICAL DATA:  Hypoxia EXAM: PORTABLE CHEST 1 VIEW COMPARISON:  July 18, 2015 FINDINGS: Endotracheal tube tip is 5.0 cm above the carina. Right jugular catheter tip is at the cavoatrial junction. Left subclavian catheter tip is in the superior vena cava near the cavoatrial junction. Nasogastric tube tip and side port are in the stomach. No pneumothorax evident. There are pleural effusions bilaterally, larger on the right than on the left. There is atelectatic change in the bases. Lungs elsewhere clear. Heart is upper normal in size with pulmonary  vascularity within normal limits. There is atherosclerotic calcification in the aorta. Patient is status post coronary artery bypass grafting. IMPRESSION: Tube and catheter positions as described. Bilateral pleural effusions with bibasilar atelectasis. Suspect a degree of underlying congestive heart failure. Aortic atherosclerosis present. Electronically Signed   By: Lowella Grip III M.D.   On: 07/19/2015 07:13    Assessment/Plan: Trayson Stitely is a 82 y.o. male with complicated course since CABG in March at [facility] now with C diff colitis (severe) and resp failure now intubated. I suspect he has an ileus given his abd distention, tenderness and decreased Bowel sounds.  Remains quite ill with severe C diff   Recommendations Severe C diff - WBC improving but still 21K  CT with diffuse wall thickening  -started on oral vanco 6/29 and IV flagyl 6/29. Added 7.3 rectal vanco given the probable ileus - continue all above. No indication for IVIG   Possible aspiration PNA - sputum cx pending  Thank you very much for the consult. Will follow with you.  [place], DAVID P   07/19/2015, 2:34 PM

## 2015-07-19 NOTE — Plan of Care (Signed)
Problem: Activity: Goal: Risk for activity intolerance will decrease Outcome: Not Progressing Unable to tolerate activity at this time.

## 2015-07-19 NOTE — Progress Notes (Signed)
OT Cancellation Note  Patient Details Name: John Robinson MRN: [id number] DOB: 07-Dec-1932   Cancelled Treatment:    Reason Eval/Treat Not Completed: Medical issues which prohibited therapy (Pt. with critical INR values. Will continue to monitor, and eval when medically appropriate.)   Harrel Carina, MS, OTR/L   Harrel Carina 07/19/2015, 8:56 AM

## 2015-07-20 ENCOUNTER — Inpatient Hospital Stay: Payer: Commercial Managed Care - HMO

## 2015-07-20 DIAGNOSIS — R7889 Finding of other specified substances, not normally found in blood: Secondary | ICD-10-CM

## 2015-07-20 DIAGNOSIS — D72829 Elevated white blood cell count, unspecified: Secondary | ICD-10-CM

## 2015-07-20 DIAGNOSIS — G9341 Metabolic encephalopathy: Secondary | ICD-10-CM

## 2015-07-20 DIAGNOSIS — I739 Peripheral vascular disease, unspecified: Secondary | ICD-10-CM

## 2015-07-20 DIAGNOSIS — E872 Acidosis: Secondary | ICD-10-CM

## 2015-07-20 LAB — CULTURE, RESPIRATORY W GRAM STAIN

## 2015-07-20 LAB — CULTURE, RESPIRATORY: CULTURE: NORMAL

## 2015-07-20 LAB — RENAL FUNCTION PANEL
ALBUMIN: 2.1 g/dL — AB (ref 3.5–5.0)
ALBUMIN: 2.1 g/dL — AB (ref 3.5–5.0)
ALBUMIN: 2.1 g/dL — AB (ref 3.5–5.0)
ANION GAP: 10 (ref 5–15)
Anion gap: 8 (ref 5–15)
Anion gap: 9 (ref 5–15)
BUN: 38 mg/dL — ABNORMAL HIGH (ref 6–20)
BUN: 32 mg/dL — AB (ref 6–20)
BUN: 28 mg/dL — AB (ref 6–20)
CALCIUM: 7.4 mg/dL — AB (ref 8.9–10.3)
CALCIUM: 7.7 mg/dL — AB (ref 8.9–10.3)
CHLORIDE: 104 mmol/L (ref 101–111)
CO2: 26 mmol/L (ref 22–32)
CO2: 26 mmol/L (ref 22–32)
CO2: 25 mmol/L (ref 22–32)
CREATININE: 3.18 mg/dL — AB (ref 0.61–1.24)
CREATININE: 2.75 mg/dL — AB (ref 0.61–1.24)
Calcium: 7.6 mg/dL — ABNORMAL LOW (ref 8.9–10.3)
Chloride: 105 mmol/L (ref 101–111)
Chloride: 104 mmol/L (ref 101–111)
Creatinine, Ser: 2.43 mg/dL — ABNORMAL HIGH (ref 0.61–1.24)
GFR calc Af Amer: 23 mL/min — ABNORMAL LOW (ref >60)
GFR calc Af Amer: 27 mL/min — ABNORMAL LOW (ref >60)
GFR calc non Af Amer: 23 mL/min — ABNORMAL LOW (ref >60)
GFR, EST AFRICAN AMERICAN: 20 mL/min — AB (ref >60)
GFR, EST NON AFRICAN AMERICAN: 17 mL/min — AB (ref >60)
GFR, EST NON AFRICAN AMERICAN: 20 mL/min — AB (ref >60)
GLUCOSE: 123 mg/dL — AB (ref 65–99)
GLUCOSE: 109 mg/dL — AB (ref 65–99)
Glucose, Bld: 135 mg/dL — ABNORMAL HIGH (ref 65–99)
PHOSPHORUS: 4.3 mg/dL (ref 2.5–4.6)
PHOSPHORUS: 4.2 mg/dL (ref 2.5–4.6)
POTASSIUM: 4.7 mmol/L (ref 3.5–5.1)
Phosphorus: 3.9 mg/dL (ref 2.5–4.6)
Potassium: 4.5 mmol/L (ref 3.5–5.1)
Potassium: 4.7 mmol/L (ref 3.5–5.1)
SODIUM: 138 mmol/L (ref 135–145)
Sodium: 141 mmol/L (ref 135–145)
Sodium: 138 mmol/L (ref 135–145)

## 2015-07-20 LAB — PREPARE FRESH FROZEN PLASMA
UNIT DIVISION: 0
Unit division: 0

## 2015-07-20 LAB — GLUCOSE, CAPILLARY
GLUCOSE-CAPILLARY: 56 mg/dL — AB (ref 65–99)
GLUCOSE-CAPILLARY: 41 mg/dL — AB (ref 65–99)
Glucose-Capillary: 121 mg/dL — ABNORMAL HIGH (ref 65–99)
Glucose-Capillary: 88 mg/dL (ref 65–99)

## 2015-07-20 LAB — PROTIME-INR
INR: 2.72
Prothrombin Time: 28.4 seconds — ABNORMAL HIGH (ref 11.4–15.0)

## 2015-07-20 LAB — CBC
HEMATOCRIT: 34.4 % — AB (ref 40.0–52.0)
Hemoglobin: 11.1 g/dL — ABNORMAL LOW (ref 13.0–18.0)
MCH: 28.7 pg (ref 26.0–34.0)
MCHC: 32.3 g/dL (ref 32.0–36.0)
MCV: 88.8 fL (ref 80.0–100.0)
Platelets: 48 10*3/uL — ABNORMAL LOW (ref 150–440)
RBC: 3.87 MIL/uL — ABNORMAL LOW (ref 4.40–5.90)
RDW: 21.6 % — AB (ref 11.5–14.5)
WBC: 24.7 10*3/uL — ABNORMAL HIGH (ref 3.8–10.6)

## 2015-07-20 LAB — MAGNESIUM
MAGNESIUM: 2 mg/dL (ref 1.7–2.4)
Magnesium: 1.9 mg/dL (ref 1.7–2.4)

## 2015-07-20 LAB — APTT: APTT: 41 s — AB (ref 24–36)

## 2015-07-20 MED ORDER — LORAZEPAM 2 MG/ML IJ SOLN
5.0000 mg/h | INTRAVENOUS | Status: DC
  Administered 2015-07-20 (×2): 5 mg/h via INTRAVENOUS
  Filled 2015-07-20 (×2): qty 25

## 2015-07-20 MED ORDER — LORAZEPAM BOLUS VIA INFUSION
2.0000 mg | INTRAVENOUS | Status: DC | PRN
Start: 2015-07-20 — End: 2015-07-20
  Administered 2015-07-20: 4 mg via INTRAVENOUS
  Administered 2015-07-20 (×2): 2 mg via INTRAVENOUS
  Administered 2015-07-20 (×2): 4 mg via INTRAVENOUS
  Filled 2015-07-20: qty 5

## 2015-07-20 MED ORDER — ATROPINE SULFATE 1 % OP SOLN
1.0000 [drp] | OPHTHALMIC | Status: DC | PRN
  Administered 2015-07-20 (×3): 2 [drp] via SUBLINGUAL
  Filled 2015-07-20: qty 2

## 2015-07-20 MED ORDER — VECURONIUM BROMIDE 10 MG IV SOLR
INTRAVENOUS | Status: AC
  Filled 2015-07-20: qty 10

## 2015-07-20 MED ORDER — STERILE WATER FOR INJECTION IJ SOLN
INTRAMUSCULAR | Status: AC
  Filled 2015-07-20: qty 10

## 2015-07-20 MED ORDER — MORPHINE BOLUS VIA INFUSION
5.0000 mg | INTRAVENOUS | Status: DC | PRN
  Administered 2015-07-20 (×2): 12.5 mg via INTRAVENOUS
  Administered 2015-07-20: 10 mg via INTRAVENOUS
  Filled 2015-07-20 (×4): qty 20

## 2015-07-20 MED ORDER — MORPHINE 100MG IN NS 100ML (1MG/ML) PREMIX INFUSION
10.0000 mg/h | INTRAVENOUS | Status: DC
  Administered 2015-07-20: 15.6 mg/h via INTRAVENOUS
  Administered 2015-07-20: 10 mg/h via INTRAVENOUS
  Filled 2015-07-20 (×2): qty 100

## 2015-07-22 LAB — CULTURE, RESPIRATORY: CULTURE: NORMAL

## 2015-07-22 LAB — CULTURE, RESPIRATORY W GRAM STAIN

## 2015-08-15 NOTE — Progress Notes (Signed)
Pt passed away at 18:44.  Family aware.  Day shift nurse, Marta Lamas notified [facility].  Morphine drip and ativan drip balance was wasted.  Morphine had a total of 30cc left and wasted.  Ativan drip had a total of 20cc. Left and wasted. Both wastes done by [employer], rn and [place], rn.

## 2015-08-15 NOTE — Progress Notes (Signed)
Patient extubated per Dr. Merian Capron order.  Extubated to 2L nasal cannula by Jenny Reichmann, RRT.  Morphine and ativan drips hung prior to extubation.  Pressors stopped post extubation.  Ativan and morphine boluses given at time of removing ET tube.  Wife and family at bedside.  Will continue to monitor.

## 2015-08-15 NOTE — Progress Notes (Signed)
Initial morning blood glucose checked on patient's ear and resulted 41.  RN delayed D10 fluids for 10 minutes and then drew blood glucose off central line and result is 121.

## 2015-08-15 NOTE — Progress Notes (Signed)
ANTICOAGULATION CONSULT NOTE - Follow up [place] for Warfarin  Indication: VTE prophylaxis  Allergies  Allergen Reactions  . Pletal [Cilostazol] Other (See Comments)    Reaction:  Dizziness    Patient Measurements: Height: 6' (182.9 cm) Weight: 184 lb 4.9 oz (83.6 kg) IBW/kg (Calculated) : 77.6  Vital Signs: Temp: 97.7 F (36.5 C) (07/06 0930) BP: 108/93 mmHg (07/06 1000) Pulse Rate: 29 (07/06 0900)  Labs:  Recent Labs  07/17/15 1549  07/17/15 2054 07/18/15 0249  07/18/15 0400  07/19/15 0550  07/19/15 2134 2015-08-01 0230 08-01-15 0534 August 01, 2015 0620  HGB  --   --   --   --   < > 12.1*  --  11.1*  --   --   --   --  11.1*  HCT  --   --   --   --   --  37.7*  --  33.5*  --   --   --   --  34.4*  PLT  --   --   --   --   --  68*  --  47*  --   --   --   --  48*  APTT  --   --   --   --   --   --   --   --   --   --   --   --  41*  LABPROT  --   --   --   --   --  50.0*  --  46.0*  --   --   --   --  28.4*  INR  --   --   --   --   --  5.76*  --  5.16*  --   --   --   --  2.72  CREATININE  --   < > 7.67*  --   --  7.50*  < > 6.14*  < > 3.18* 2.75* 2.43*  --   TROPONINI 0.36*  --  0.67* 0.94*  --   --   --   --   --   --   --   --   --   < > = values in this interval not displayed.  Estimated Creatinine Clearance: 26.2 mL/min (by C-G formula based on Cr of 2.43).  Assessment: Pharmacy consulted to dose warfarin in this 82 year old male admitted with PNA and Cdiff. Currently also on metronidazole and oral vancomycin.  Pt was on warfarin 5 mg PO QHS at home.  INR on 6/26 was 4.25.   6/28 INR  2.95  Warfarin 2 mg 6/29 INR  3.78  none 6/30 INR  4.06  none 7/1   INR  3.45  none 7/2   INR  3.29  none 7/3   INR  4.04  None 7/4   INR  5.76  None 7/5   INR 5.16   None 7/6   INR 2.72  Goal of Therapy:  INR 2-3   Plan:  INR now in therapeutic range but labile so will continue to hold warfarin and f/u AM INR.   Napoleon Form, RPh Clinical  Pharmacist 08/01/15,11:49 AM

## 2015-08-15 NOTE — Care Management (Signed)
Patient is now on comfort care.

## 2015-08-15 NOTE — Progress Notes (Signed)
Per Dr. Oletta Lamas: do not transfer pt to the floor, pt to remain in unit overnight or until death.

## 2015-08-15 NOTE — Progress Notes (Signed)
[facility] NOTE  Pharmacy Consult for Renal dosing of medications Indication: CRRT   Allergies  Allergen Reactions  . Pletal [Cilostazol] Other (See Comments)    Reaction:  Dizziness    Patient Measurements: Height: 6' (182.9 cm) Weight: 184 lb 4.9 oz (83.6 kg) IBW/kg (Calculated) : 77.6   Vital Signs: Temp: 97.7 F (36.5 C) (07/06 0930) BP: 108/93 mmHg (07/06 1000) Pulse Rate: 29 (07/06 0900) Intake/Output from previous day: 07/05 0701 - 07/06 0700 In: 2890.9 [I.V.:2125.9; Blood:465; IV Piggyback:300] Out: 989  Intake/Output from this shift: Total I/O In: 387.7 [I.V.:327.7; NG/GT:60] Out: 48 [Other:48] Vent settings for last 24 hours: Vent Mode:  [-] PRVC FiO2 (%):  [75 %] 75 % Set Rate:  [24 bmp] 24 bmp Vt Set:  [196 mL] 620 mL PEEP:  [5 cmH20] 5 cmH20  Labs:  Recent Labs  07/18/15 0400  07/19/15 0550  07/19/15 2134 06-Aug-2015 0230 2015-08-06 0534 08-06-15 0608 08-06-2015 0620  WBC 31.2*  --  21.7*  --   --   --   --   --  24.7*  HGB 12.1*  --  11.1*  --   --   --   --   --  11.1*  HCT 37.7*  --  33.5*  --   --   --   --   --  34.4*  PLT 68*  --  47*  --   --   --   --   --  48*  APTT  --   --   --   --   --   --   --   --  41*  INR 5.76*  --  5.16*  --   --   --   --   --  2.72  CREATININE 7.50*  < > 6.14*  < > 3.18* 2.75* 2.43*  --   --   MG 3.0*  --  2.5*  < > 2.0 2.0  --  1.9  --   PHOS 11.4*  --  8.4*  < > 4.3 4.2 3.9  --   --   ALBUMIN  --   --   --   < > 2.1* 2.1* 2.1*  --   --   < > = values in this interval not displayed. Estimated Creatinine Clearance: 26.2 mL/min (by C-G formula based on Cr of 2.43).   Recent Labs  08/06/15 0005 08/06/2015 0404 08/06/2015 0723  GLUCAP 88 63* 41*    Microbiology: Recent Results (from the past 720 hour(s))  C difficile quick scan w PCR reflex     Status: Abnormal   Collection Time: 07/09/2015  1:45 AM  Result Value Ref Range Status   C Diff antigen POSITIVE (A) NEGATIVE Final   C Diff  toxin POSITIVE (A) NEGATIVE Final   C Diff interpretation   Final    Positive for toxigenic C. difficile, active toxin production present.    Comment: CRITICAL RESULT CALLED TO, READ BACK BY AND VERIFIED WITH:  JACK DOUGHERTY AT 2229 07/11/15 SDR   Blood culture (routine x 2)     Status: None   Collection Time: 06/24/2015  2:10 PM  Result Value Ref Range Status   Specimen Description BLOOD RIGHT ASSIST CONTROL  Final   Special Requests BOTTLES DRAWN AEROBIC AND ANAEROBIC [place]  Final   Culture NO GROWTH 5 DAYS  Final   Report Status 07/15/2015 FINAL  Final  Blood culture (routine x 2)  Status: None   Collection Time: 06/25/2015  2:10 PM  Result Value Ref Range Status   Specimen Description BLOOD RIGHT HAND  Final   Special Requests   Final    BOTTLES DRAWN AEROBIC AND ANAEROBIC [place]   Culture NO GROWTH 5 DAYS  Final   Report Status 07/15/2015 FINAL  Final  MRSA PCR Screening     Status: Abnormal   Collection Time: 07/11/15  7:06 PM  Result Value Ref Range Status   MRSA by PCR POSITIVE (A) NEGATIVE Final    Comment:        The GeneXpert MRSA Assay (FDA approved for NASAL specimens only), is one component of a comprehensive MRSA colonization surveillance program. It is not intended to diagnose MRSA infection nor to guide or monitor treatment for MRSA infections. READ BACK AND VERIFIED BY MARCELLA TURNER AT 2120 07/11/2015.  TFK   Culture, expectorated sputum-assessment     Status: None   Collection Time: 07/17/15  3:50 PM  Result Value Ref Range Status   Specimen Description TRACHEAL ASPIRATE  Final   Special Requests Normal  Final   Sputum evaluation   Final    Sputum specimen not acceptable for testing.  Please recollect.   CALLED [facility] 07/17/15 @ 0865  [place]   Report Status 07/17/2015 FINAL  Final  Culture, expectorated sputum-assessment     Status: None   Collection Time: 07/17/15  3:50 PM  Result Value Ref Range Status   Specimen Description  SPUTUM  Final   Special Requests NONE  Final   Sputum evaluation THIS SPECIMEN IS ACCEPTABLE FOR SPUTUM CULTURE  Final   Report Status 07/17/2015 FINAL  Final  Culture, respiratory (NON-Expectorated)     Status: None   Collection Time: 07/17/15  3:50 PM  Result Value Ref Range Status   Specimen Description SPUTUM  Final   Special Requests NONE Reflexed from [id number]  Final   Gram Stain   Final    FEW WBC PRESENT,BOTH PMN AND MONONUCLEAR FEW SQUAMOUS EPITHELIAL CELLS PRESENT MODERATE GRAM POSITIVE RODS FEW YEAST RARE GRAM NEGATIVE RODS    Culture   Final    Consistent with normal respiratory flora. Performed at [facility]    Report Status 08/15/15 FINAL  Final  Culture, expectorated sputum-assessment     Status: None   Collection Time: 07/19/15  5:15 PM  Result Value Ref Range Status   Specimen Description TRACHEAL ASPIRATE  Final   Special Requests NONE  Final   Sputum evaluation THIS SPECIMEN IS ACCEPTABLE FOR SPUTUM CULTURE  Final   Report Status 07/19/2015 FINAL  Final  Culture, respiratory (NON-Expectorated)     Status: None (Preliminary result)   Collection Time: 07/19/15  5:15 PM  Result Value Ref Range Status   Specimen Description TRACHEAL ASPIRATE  Final   Special Requests NONE Reflexed from [id number]  Final   Gram Stain   Final    ABUNDANT WBC PRESENT,BOTH PMN AND MONONUCLEAR ABUNDANT GRAM VARIABLE ROD ABUNDANT YEAST    Culture   Final    CULTURE REINCUBATED FOR BETTER GROWTH Performed at [facility]    Report Status PENDING  Incomplete    Medications:  Scheduled:  . sodium chloride   Intravenous Once  . acidophilus  1 capsule Oral BID  . antiseptic oral rinse  7 mL Mouth Rinse 10 times per day  . atorvastatin  80 mg Per Tube QHS  . budesonide (PULMICORT) nebulizer solution  0.25 mg Nebulization BID  .  chlorhexidine gluconate (SAGE KIT)  15 mL Mouth Rinse BID  . [START ON 07/21/2015] famotidine (PEPCID) IV  20 mg Intravenous Q48H  . fentaNYL  (SUBLIMAZE) injection  50 mcg Intravenous Once  . fentaNYL (SUBLIMAZE) injection  50 mcg Intravenous Once  . insulin aspart  0-20 Units Subcutaneous TID WC  . insulin glargine  20 Units Subcutaneous QHS  . ipratropium-albuterol  3 mL Nebulization Q4H  . metronidazole  500 mg Intravenous Q8H  . vancomycin  500 mg Oral Q6H  . vancomycin (VANCOCIN) rectal ENEMA  500 mg Rectal Q6H   Infusions:  . dextrose 40 mL/hr at 07/19/15 2011  . DOPamine Stopped (07/18/15 1730)  . fentaNYL infusion INTRAVENOUS Stopped (07/22/15 1017)  . LORazepam (ATIVAN) infusion 5 mg/hr (22-Jul-2015 1009)  . morphine 12.5 mg/hr (07-22-15 1116)  . norepinephrine (LEVOPHED) Adult infusion Stopped (22-Jul-2015 1016)  . pureflow 3 each (07/22/15 0816)  . vasopressin (PITRESSIN) infusion - *FOR SHOCK* Stopped (07/22/2015 1016)    Assessment: Pharmacy consulted to assist in renally dosing medications in this 82 y/o M with sepsis from C diff and ESRD transitioning from HD to CRRT due to hemodynamic instability.   Plan:  No further medications require adjustment at present. Will continue to monitor and adjust per protocol.   Ulice Dash D Jul 22, 2015,11:59 AM

## 2015-08-15 NOTE — Progress Notes (Signed)
Called [facility] to ask about foley for crrt pt. Dr. Elsworth Soho said that he was end stage renal and no foley needed.

## 2015-08-15 NOTE — Progress Notes (Signed)
Patient resting comfortably at this time.  Morphine and ativan drips infusing for discomfort.  NSR rate 89 per cardiac monitor.  Slow deep respirations.  Wife, son, pastor and grandchildren at bedside.  Report given to [place], RN who is now taking over patient's care.

## 2015-08-15 NOTE — Progress Notes (Signed)
CRRT stopped.  Dr. Stevenson Clinch spoke with patient's family and they decided to make patient comfort care. Waiting for family to arrive.

## 2015-08-15 NOTE — Progress Notes (Signed)
Comfort care,extubated per MD order to 2lnc

## 2015-08-15 NOTE — Discharge Summary (Signed)
Date of Admission: 06/18/2015 Date of Death: 08/10/2015 Time of Death: 18:44  History: 82 year old male past medical history of end-stage renal disease on dialysis, coronary artery disease, recent CABG at [facility] with prolonged hospitalization for complicated course, admitted to [facility] which shortness of breath. Off note, patient had a prolonged hospitalization and nursing home stay after his CABG secondary to multiple consultations including bacteremia, pneumonia, sternotomy wound infection, deconditioning, renal failure. Other day of admission he was having shortness of breath, feeling fatigue, feeling weak, having diarrhea, and trouble with swallowing. He also admitted to cold chills, and was coughing up greenish phlegm, admitted to worry diarrhea almost every hour. Prior to hospitalization he was only home for about 1 week after being in rehabilitation for about 6 weeks.  Hospital course: Patient was admitted to the general medical floor, was treated for bilateral pneumonia, end-stage renal disease on dialysis, and C. difficile colitis. His hospital course was complicated by worsening sepsis along with eventual respiratory failure and a brief PEA arrests requiring 2 cycles of ACLS followed by intubation and admission to the ICU. His intubation and brief PEA arrest occurred on 07/17/2015.  While in the ICU, he remain on account of ventilator, was requiring 2-3 pressors at times, continue to be dependent on the mechanical ventilator. He could not tolerate hemodialysis due to severe hypotension requiring multiple pressors. For his C. difficile colitis, he was started on IV Ativan, Flagyl, and rectal vancomycin, with very little improvement. His abdomen continued to be firm and distended, diarrhea eventually stop, the patient did not make any significant stool during the last 3 days. He did not have any significant improvement with advance therapies including chronic renal replacement therapy, mechanical  ventilation, maximal vasopressor dosing, and antibiotics. Given his worsening clinical status, along with C. difficile colitis/sepsis/ventilator dependent respiratory failure, his wife and family decided on withdrawal of care on 10-Aug-2015. Patient did have a living will that stated if he did not have any significant improvement in clinical status after several days of advance therapies (as stated above) (, that his family are to respect his wishes of comfort care and no further prolongation of suffering. Patient was extubated to comfort care on 08-10-2015; this was done respect to family desires and patient wishes.  Time of death- 18:44 Date of Death- Aug 10, 2015   Problem list: Septic shock related to C. Difficile Acute hypoxemic respiratory failure Aspiration PNA Severe COPD/Emphysema Cardiopulmonary arrest possibly related to aspiration A. fib with RVR ESRD On HD-Now on CRRT Hyperkalemia-improved C difficile colitis H/O Severe peripheral vascular disease CABG with stenting, on anticoagulation ELEVATED INR Leukocytosis with increasing white cell count, due to C. difficile with sepsis and steroids Hyperglycemia-steroid induced  Metabolic encephalopathy  Vilinda Boehringer, MD [facility] Pager [phone] (please enter 7-digits) On Call Pager - [phone] (please enter 7-digits)

## 2015-08-15 NOTE — Progress Notes (Signed)
CDS notified of pending death of patient.  RN spoke with Maryjean Morn, CDS coordinator.

## 2015-08-15 NOTE — Progress Notes (Signed)
PULMONARY / CRITICAL CARE MEDICINE   Name: John Robinson MRN: [id number] DOB: 31-Dec-1932    ADMISSION DATE:  07/01/2015 CONSULTATION DATE: 07/17/15  REFERRING MD:  Sherley Bounds  CHIEF COMPLAINT:  Acute Respiratory failure   PATIENT PROFILE: 82 year old male severe debility, ESRD and deconditioning, status post long hospital admission for CABG, complicated by sternal wound infection. Went home one week ago after 3 months of being in medical facilities, returned with C. difficile with sepsis. Patient noted to have gasping respirations and appeared to be in respiratory distress, therefore decided to intubate the patient.  Patient had brieif loss of pulse, ACLS was initiated with ROSC. Advanced airway placed and the patient is mechanically ventilated.  Now DNR  SUBJECTIVE: Remains intubated and sedated with episodes of agitation. Starting to have peripheral cyanosis/necrosis from pressors. I have met with the family and discussed the overall decline in the patient's clinical condition and they have decided for withdrawal of care today. Patient has a living will that states these are his wishes.  VITAL SIGNS: BP 91/76 mmHg  Pulse 85  Temp(Src) 97 F (36.1 C) (Other (Comment))  Resp 24  Ht 6' (1.829 m)  Wt 184 lb 4.9 oz (83.6 kg)  BMI 24.99 kg/m2  SpO2 77%  HEMODYNAMICS: CVP:  [3 mmHg-8 mmHg] 6 mmHg  VENTILATOR SETTINGS: Vent Mode:  [-] PRVC FiO2 (%):  [75 %] 75 % Set Rate:  [24 bmp] 24 bmp Vt Set:  [620 mL-630 mL] 620 mL PEEP:  [5 cmH20] 5 cmH20  INTAKE / OUTPUT: I/O last 3 completed shifts: In: 4932.9 [I.V.:3617.9; Blood:465; Other:200; NG/GT:100; IV Piggyback:550] Out: 9562 [Other:989; Stool:200]  PHYSICAL EXAMINATION: General: acutely ill looking, sedated Neuro: Agitated with least stimulation; moves all extremities HEENT: Atraumatic, normocephalic, PERRLA, oral mucosa moist Cardiovascular: HR irregular-irregular, S1S2, no MRG noted Lungs: coarse and crackly  throughout, symmetrical chest expansion. ETT with minimal secretions Abdomen:  Distended, hypoactive bowel sounds, tender on light palpation Musculoskeletal:  No inflammation/ deformity noted Skin:   Mid sternal surgical incision, peripheral cyanosis/purple appearance of fingers and toes  LABS:  BMET  Recent Labs Lab 07/19/15 2134 July 25, 2015 0230 2015/07/25 0534  NA 141 138 138  K 4.5 4.7 4.7  CL 105 104 104  CO2 _0 BUN 38* 32* 28*  CREATININE 3.18* 2.75* 2.43*  GLUCOSE 135* 123* 109*    Electrolytes  Recent Labs Lab 07/19/15 2134 2015/07/25 0230 07-25-15 0534 07-25-2015 0608  CALCIUM 7.4* 7.7* 7.6*  --   MG 2.0 2.0  --  1.9  PHOS 4.3 4.2 3.9  --     CBC  Recent Labs Lab 07/18/15 0400 07/19/15 0550 07-25-15 0620  WBC 31.2* 21.7* 24.7*  HGB 12.1* 11.1* 11.1*  HCT 37.7* 33.5* 34.4*  PLT 68* 47* 48*    Coag's  Recent Labs Lab 07/18/15 0400 07/19/15 0550 07/25/15 0620  APTT  --   --  41*  INR 5.76* 5.16* 2.72    Sepsis Markers  Recent Labs Lab 07/14/15 1730  07/17/15 1445 07/17/15 1703 07/18/15 0400 07/19/15 0550  LATICACIDVEN 2.2*  --  6.2* 7.2*  --   --   PROCALCITON 2.30  < > 1.70  --  3.90 5.06  < > = values in this interval not displayed.  ABG  Recent Labs Lab 07/14/15 1800 07/17/15 1613 07/18/15 0148  PHART 7.32* 7.22* 7.30*  PCO2ART 48 28* 30*  PO2ART  --  109* 77*    Liver Enzymes  Recent Labs Lab  07/19/15 2134 08/12/15 0230 08-12-15 0534  ALBUMIN 2.1* 2.1* 2.1*    Cardiac Enzymes  Recent Labs Lab 07/17/15 1549 07/17/15 2054 07/18/15 0249  TROPONINI 0.36* 0.67* 0.94*    Glucose  Recent Labs Lab 07/19/15 2020 07/19/15 2024 07/19/15 2225 August 12, 2015 0005 2015/08/12 0404 2015-08-12 0723  GLUCAP 37* 153* 113* 88 56* 41*    Imaging Dg Abd 1 View  07/19/2015  CLINICAL DATA:  Abdominal distention EXAM: ABDOMEN - 1 VIEW COMPARISON:  07/17/2015 FINDINGS: Gas is distension of the colon is stable. There is no  obvious free intraperitoneal gas. There are no disproportionally dilated loops of small bowel. NG tube tip remains in the stomach with its tip at the antrum. Stent graft is stable. IMPRESSION: Stable colonic distention. Stable NG tube in the antrum of the stomach. Electronically Signed   By: Marybelle Killings M.D.   On: 07/19/2015 12:03   Dg Chest Port 1 View  Aug 12, 2015  CLINICAL DATA:  Acute respiratory failure, pneumonia, sepsis, history of coronary artery disease, former smoker. EXAM: PORTABLE CHEST 1 VIEW COMPARISON:  Portable chest x-ray of July 19, 2015 FINDINGS: The lungs are reasonably well inflated. There are bilateral pleural effusions greater on the right than on the left. Bibasilar atelectasis or pneumonia is present. The cardiac silhouette is top-normal in size. The pulmonary vascularity is not clearly engorged. There is calcification in the wall of the aortic arch. The dual-lumen dialysis type catheter tip projects over the distal SVC. The endotracheal tube tip lies 7.6 cm above the carina. The esophagogastric tube tip projects below the inferior margin of the image. The right internal jugular venous catheter tip projects over the middle third of the SVC. The sternal wires are intact. IMPRESSION: Persistent bibasilar atelectasis or pneumonia and small pleural effusions greater on the right than on the left. No significant pulmonary vascular congestion. The support devices are in stable position. Aortic atherosclerosis. Electronically Signed   By: David  [place] M.D.   On: 12-Aug-2015 07:06     STUDIES:  7/3 CT head without contrast>>No ICH 7/3 CT abdomen and pelvis>>Diffuse edematous wall thickening, consistent with the reported history of C. difficile infection.  CULTURES: 7/4 sputum culture>> 6/26 blood culture NGTD 6/26 C-Diff>> Positive  ANTIBIOTICS: vancomycin 6/26 >> 6/27 Azithromycin 6/26 >> 6/27 cefepime 6/26 >> 6/28 metronidazole 6/27 >> Vancomycin PO 6/27 >>  Vancomycin rectal  7/3>  SIGNIFICANT EVENTS 07/17/15>>Patient had brief episode of loss of pulse and ACLS started, received epi x 2, bicarb x 1 7/3>>  Patient was in respiratory distress requiring emergent intubation,thick secretions, possible aspiration 7/4> HD, elevated INR, 1u FFP, more awake, still on pressors   7/5>vomiting, TF on hold, starting CRRT  LINES/TUBES: 7/3 ET tube>> 6/30 Right internal jugular>>  DISCUSSION 82 year old male severe debility, ESRD and deconditioning, status post prolonged hospital admission for CABG, complicated by sternal wound infection. Went home one week ago after 3 months of being in medical facilities, returned with C. difficile with sepsis. Patient noted to have gasping respirations and appeared to be in respiratory distress, therefore decided to intubate the patient on 7/2.  Patient had brief loss of pulse, ACLS was initiated with ROSC. Advanced airway placed and the patient is mechanically ventilated.  Since intubation patient has had steady clinical decline with increased pressor requirement, transitioning from HD to CRRT, increased FiO2 requirement, and overall worsening status.   ASSESSMENT / PLAN:  Septic shock related to C. Difficile Acute hypoxemic respiratory failure Aspiration PNA Severe COPD/Emphysema Cardiopulmonary arrest possibly  related to aspiration A. fib with RVR ESRD On HD-Now on CRRT Hyperkalemia-improved C difficile colitis H/O Severe peripheral vascular disease CABG with stenting, on anticoagulation ELEVATED INR Leukocytosis with increasing white cell count, due to C. difficile with sepsis and steroids Hyperglycemia-steroid induced  Metabolic encephalopathy   Plan: -That with family members, including wife, 2 sons, grandkids, daughter-in-law, pastor and had family discussion regarding worsening clinical status, poor clinical response, increasing advanced therapies without any significant clinical improvement. -Wife has a living will that  states patient not want prolong duration of advanced therapies to include dialysis, ventilation, pressors, etc. -At this time wife has decided that advanced therapies at been utilized to its utmost without any significant clinical turnaround, wife and family has decided on withdrawal of care with respect to patient's wishes and his living will. -Orders for withdrawal care placed.  I have personally obtained a history, examined the patient, evaluated laboratory and imaging results, formulated the assessment and plan and placed orders. CRITICAL CARE: The patient is critically ill with multiple organ systems failure and requires high complexity decision making for assessment and support, frequent evaluation and titration of therapies, application of advanced monitoring technologies and extensive interpretation of multiple databases. Critical Care Time devoted to patient care services described in this note is 45 minutes.    Vilinda Boehringer, MD [place] [facility] Pager [phone] (please enter 7-digits) On Call Pager - [phone] (please enter 7-digits)

## 2015-08-15 NOTE — Consult Note (Signed)
Pharmacy Antibiotic Note  John Robinson is a 82 y.o. male admitted on 06/15/2015 with pneumonia. MD notes dysphagia and concern for aspiration PNA. Pharmacy has been consulted for C diff management. Hemodialysis patient transitioned to CRRT.    Plan: Will continue current orders for Metronidazole 500mg  IV Q8hr and Vancomycin 500 mg PO q6h. Vancomycin enema added per ID. Patient is currently on day 9 of therapy. Will f/u planned duration of therapy.   Height: 6' (182.9 cm) Weight: 184 lb 4.9 oz (83.6 kg) IBW/kg (Calculated) : 77.6  Temp (24hrs), Avg:96.6 F (35.9 C), Min:95.5 F (35.3 C), Max:97.7 F (36.5 C)   Recent Labs Lab 07/14/15 1730  07/16/15 0500 07/17/15 0445 07/17/15 1445 07/17/15 1703  07/18/15 0400  07/19/15 0550  07/19/15 1330 07/19/15 1715 07/19/15 2134 26-Jul-2015 0230 Jul 26, 2015 0534 07-26-2015 0620  WBC  --   < > 44.2* 30.3*  --   --   --  31.2*  --  21.7*  --   --   --   --   --   --  24.7*  CREATININE 4.84*  < > 6.22* 6.86*  --   --   < > 7.50*  < > 6.14*  < > 4.69* 4.00* 3.18* 2.75* 2.43*  --   LATICACIDVEN 2.2*  --   --   --  6.2* 7.2*  --   --   --   --   --   --   --   --   --   --   --   < > = values in this interval not displayed.  Estimated Creatinine Clearance: 26.2 mL/min (by C-G formula based on Cr of 2.43).    Allergies  Allergen Reactions  . Pletal [Cilostazol] Other (See Comments)    Reaction:  Dizziness    Antimicrobials this admission: vancomycin 6/26 >> 6/27 Azithromycin 6/26 >> 6/27 cefepime 6/26 >> 6/28 Metronidazole 6/27 >> Vancomycin PO 6/27 >>  Vancomycin enema 7/3 >>   Dose adjustments this admission:  Microbiology results: 6/26 BCx: NG 6/26 cdiff: positive  6/27 Sputum Cx: normal flora 6/27 MRSA PCR: positive 7/3 SCx: pending  Pharmacy will continue to monitor and adjust per consult.    Ulice Dash, PharmD Clinical Pharmacist   2015/07/26 12:01 PM

## 2015-08-15 NOTE — Progress Notes (Signed)
Nutrition Brief Note  Chart reviewed. Pt now transitioning to comfort care. Plan for terminal extubation, remains NPO No further nutrition interventions warranted at this time. RD will sign off.  Please re-consult as needed.   Kerman Passey [place], [place], LDN [phone] Pager  [phone] Weekend/On-Call Pager

## 2015-08-15 NOTE — Progress Notes (Signed)
Pastoral Care provided °

## 2015-08-15 DEATH — deceased

## 2016-03-31 IMAGING — CT CT HEAD WITHOUT CONTRAST
1 series · 16 of 30 positions shown, 20 images · non-contrast
Comparison: 02/02/2013 and prior head CTs

CLINICAL DATA: 81-year-old male with headache and hypertension.

EXAM:
CT HEAD WITHOUT CONTRAST
TECHNIQUE: Contiguous axial images were obtained from the base of the skull
through the vertex without intravenous contrast.

[Series 2: head wo · axial · 0.42mm/px · z∈[+408,+552]mm · 16 of 36 slices shown, 20 images]
[im 2/36  brain]
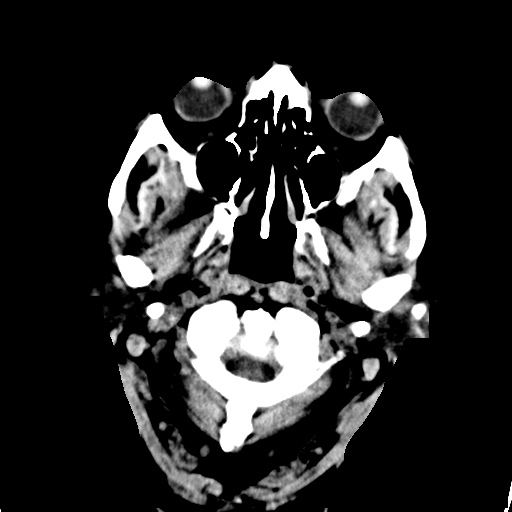
[im 2/36  bone]
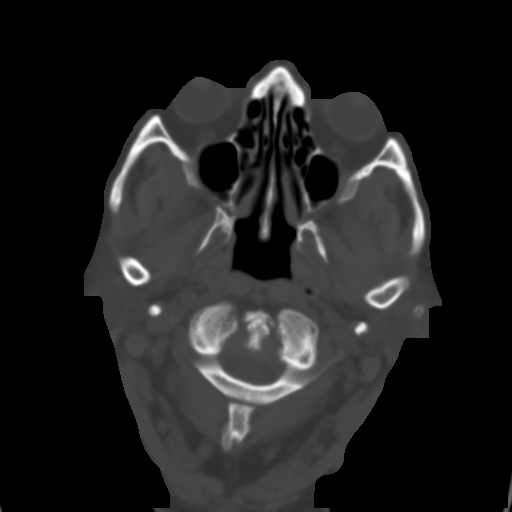
[im 4/36  brain]
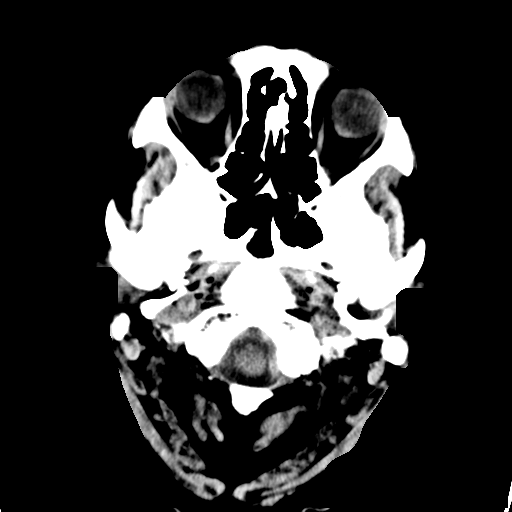
[im 7/36  brain]
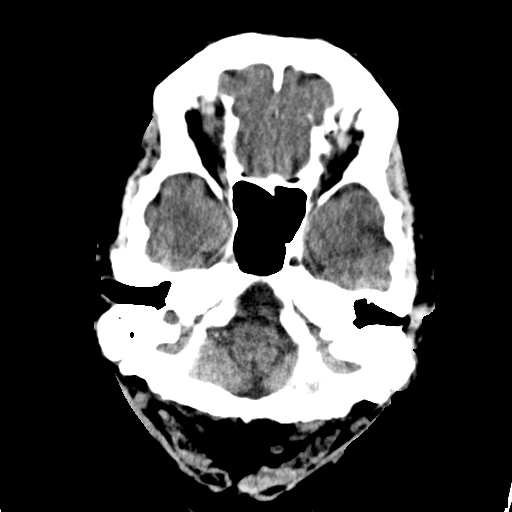
[im 9/36  brain]
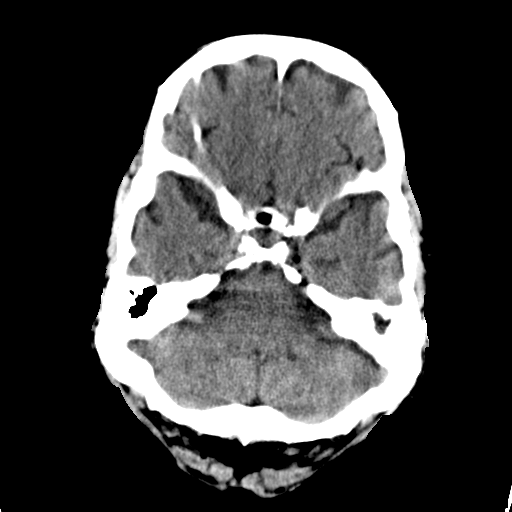
[im 10/36  brain]
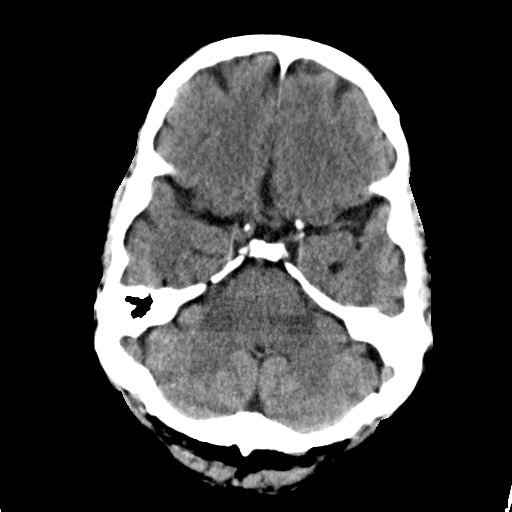
[im 10/36  bone]
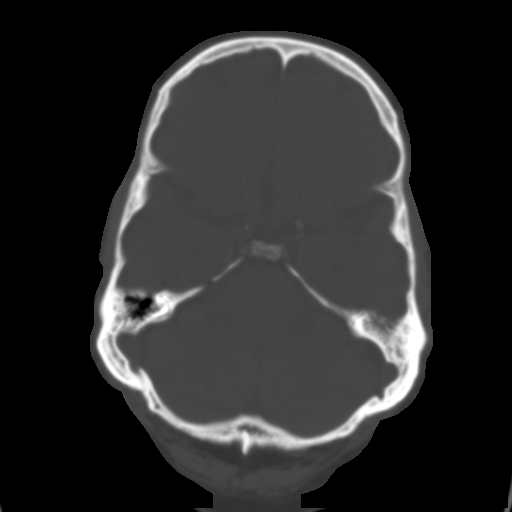
[im 13/36  brain]
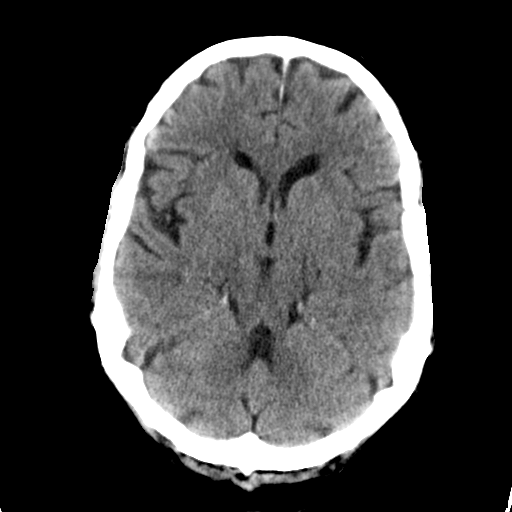
[im 15/36  brain]
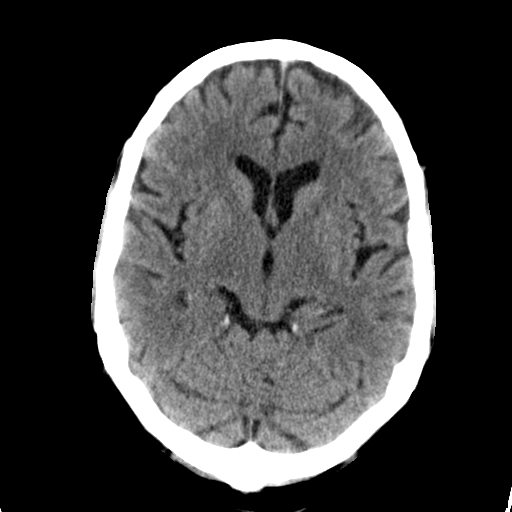
[im 17/36  brain]
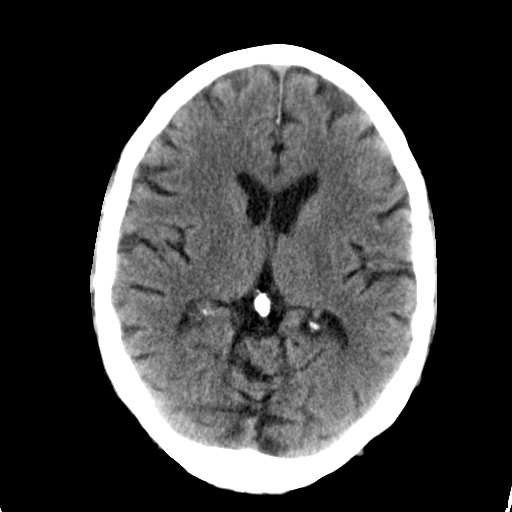
[im 19/36  brain]
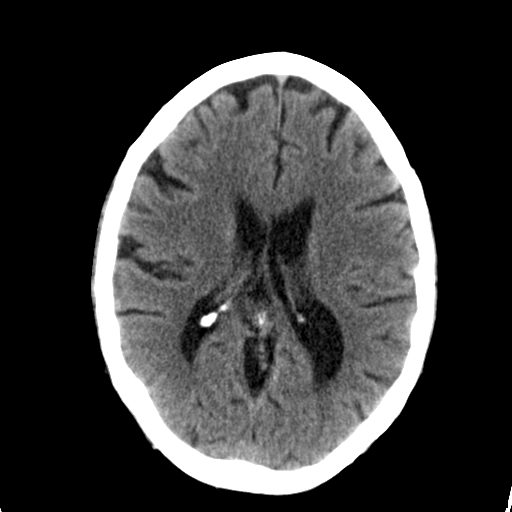
[im 19/36  bone]
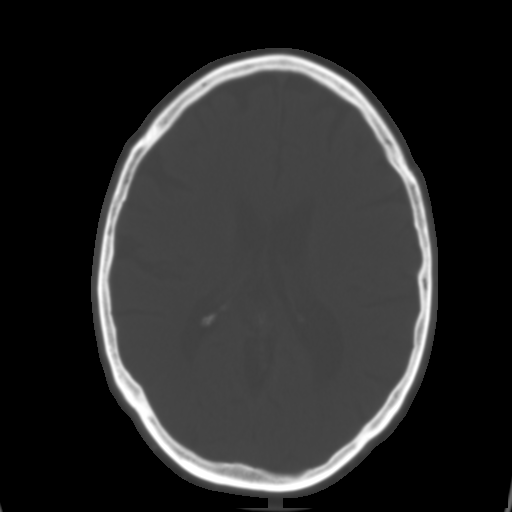
[im 21/36  brain]
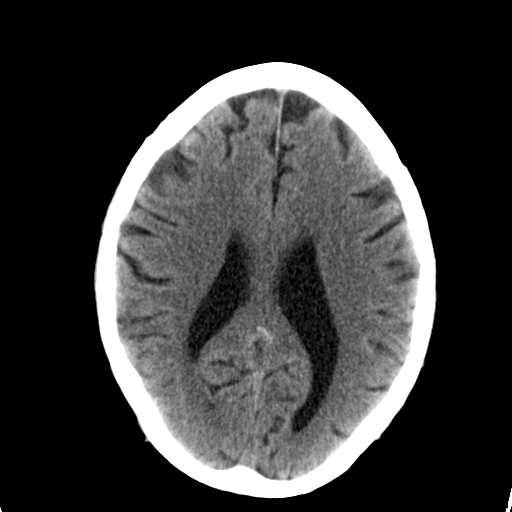
[im 23/36  brain]
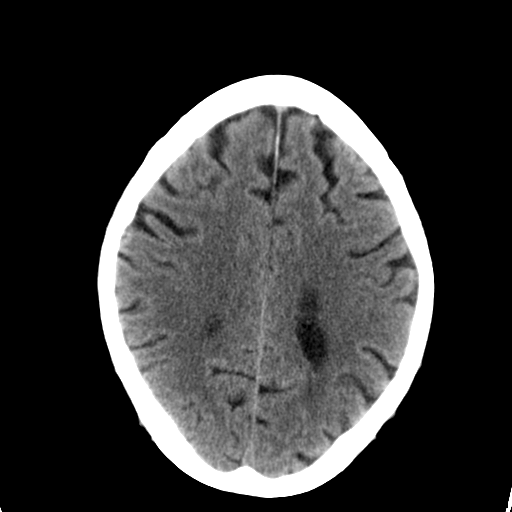
[im 26/36  brain]
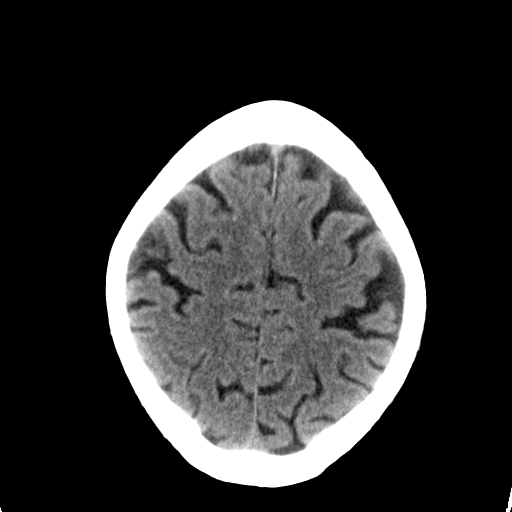
[im 27/36  brain]
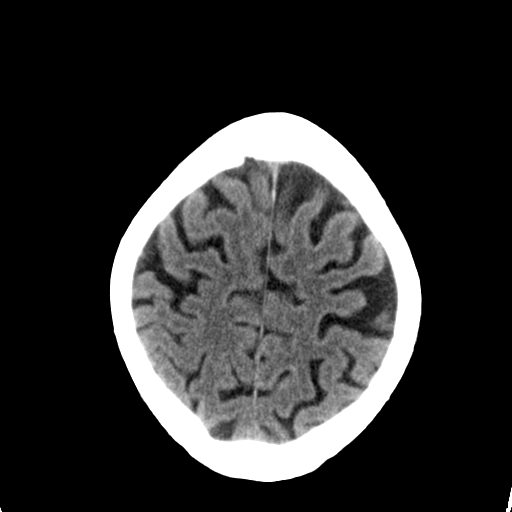
[im 27/36  bone]
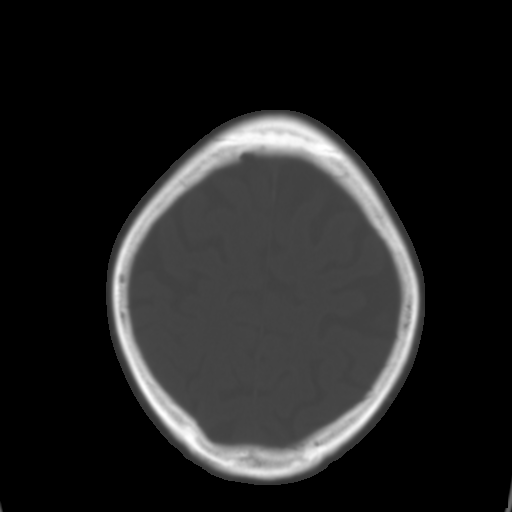
[im 29/36  brain]
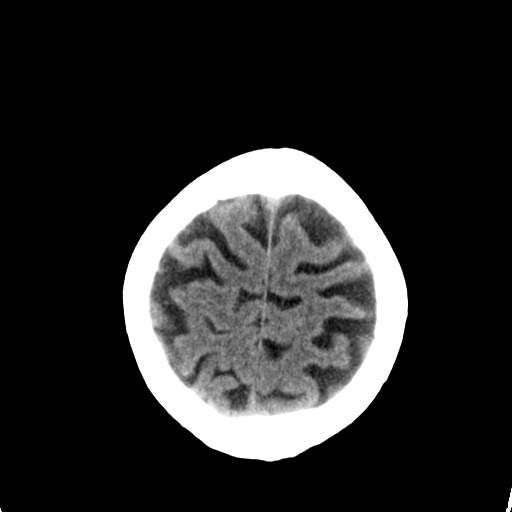
[im 32/36  brain]
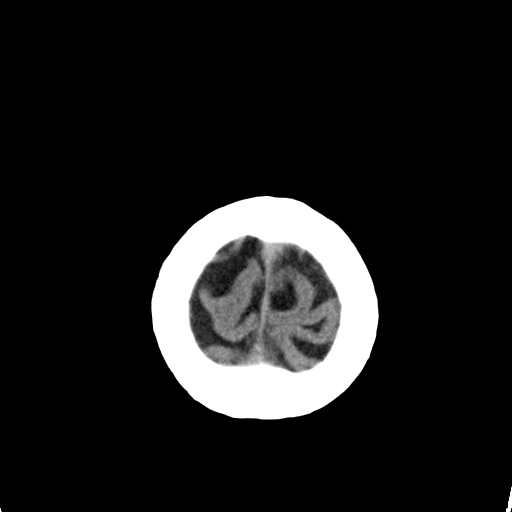
[im 34/36  brain]
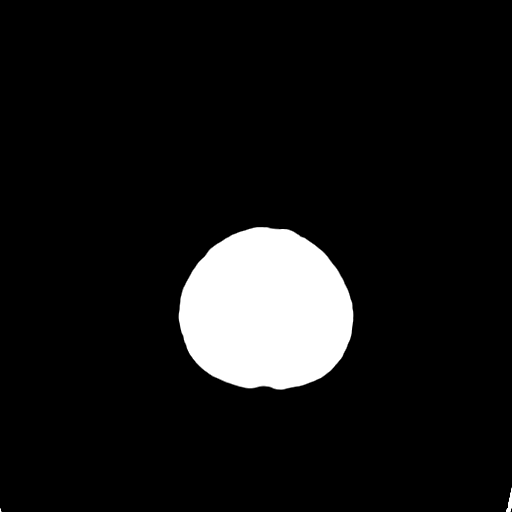

[16 of 30 positions shown; findings below may reference images not displayed]

FINDINGS: Mild generalized cerebral volume loss and minimal chronic
small-vessel white matter ischemic changes are again noted.

No acute intracranial abnormalities are identified, including mass
lesion or mass effect, hydrocephalus, extra-axial fluid collection,
midline shift, hemorrhage, or acute infarction.

Left mastoid postsurgical changes and fluid again noted.

There appears to be at least a mild to moderate degree of central
spinal narrowing at the C1-C2 level.
IMPRESSION: No evidence of acute intracranial abnormality.

Mild atrophy and very mild chronic small-vessel white matter
ischemic changes.

Suggestion of some degree of central spinal narrowing at the C1-2
level.

## 2016-07-31 IMAGING — CR DG CHEST 2V
1 series · 2 of 2 positions shown · non-contrast
Comparison: 11/28/2013

CLINICAL DATA: Hypertension and dizziness, cough

EXAM:
CHEST  2 VIEW

[Series 1: dxr chest pa (or ap) and lateral · 0.14mm/px · 2 of 2 slices shown]
[im 1/2]
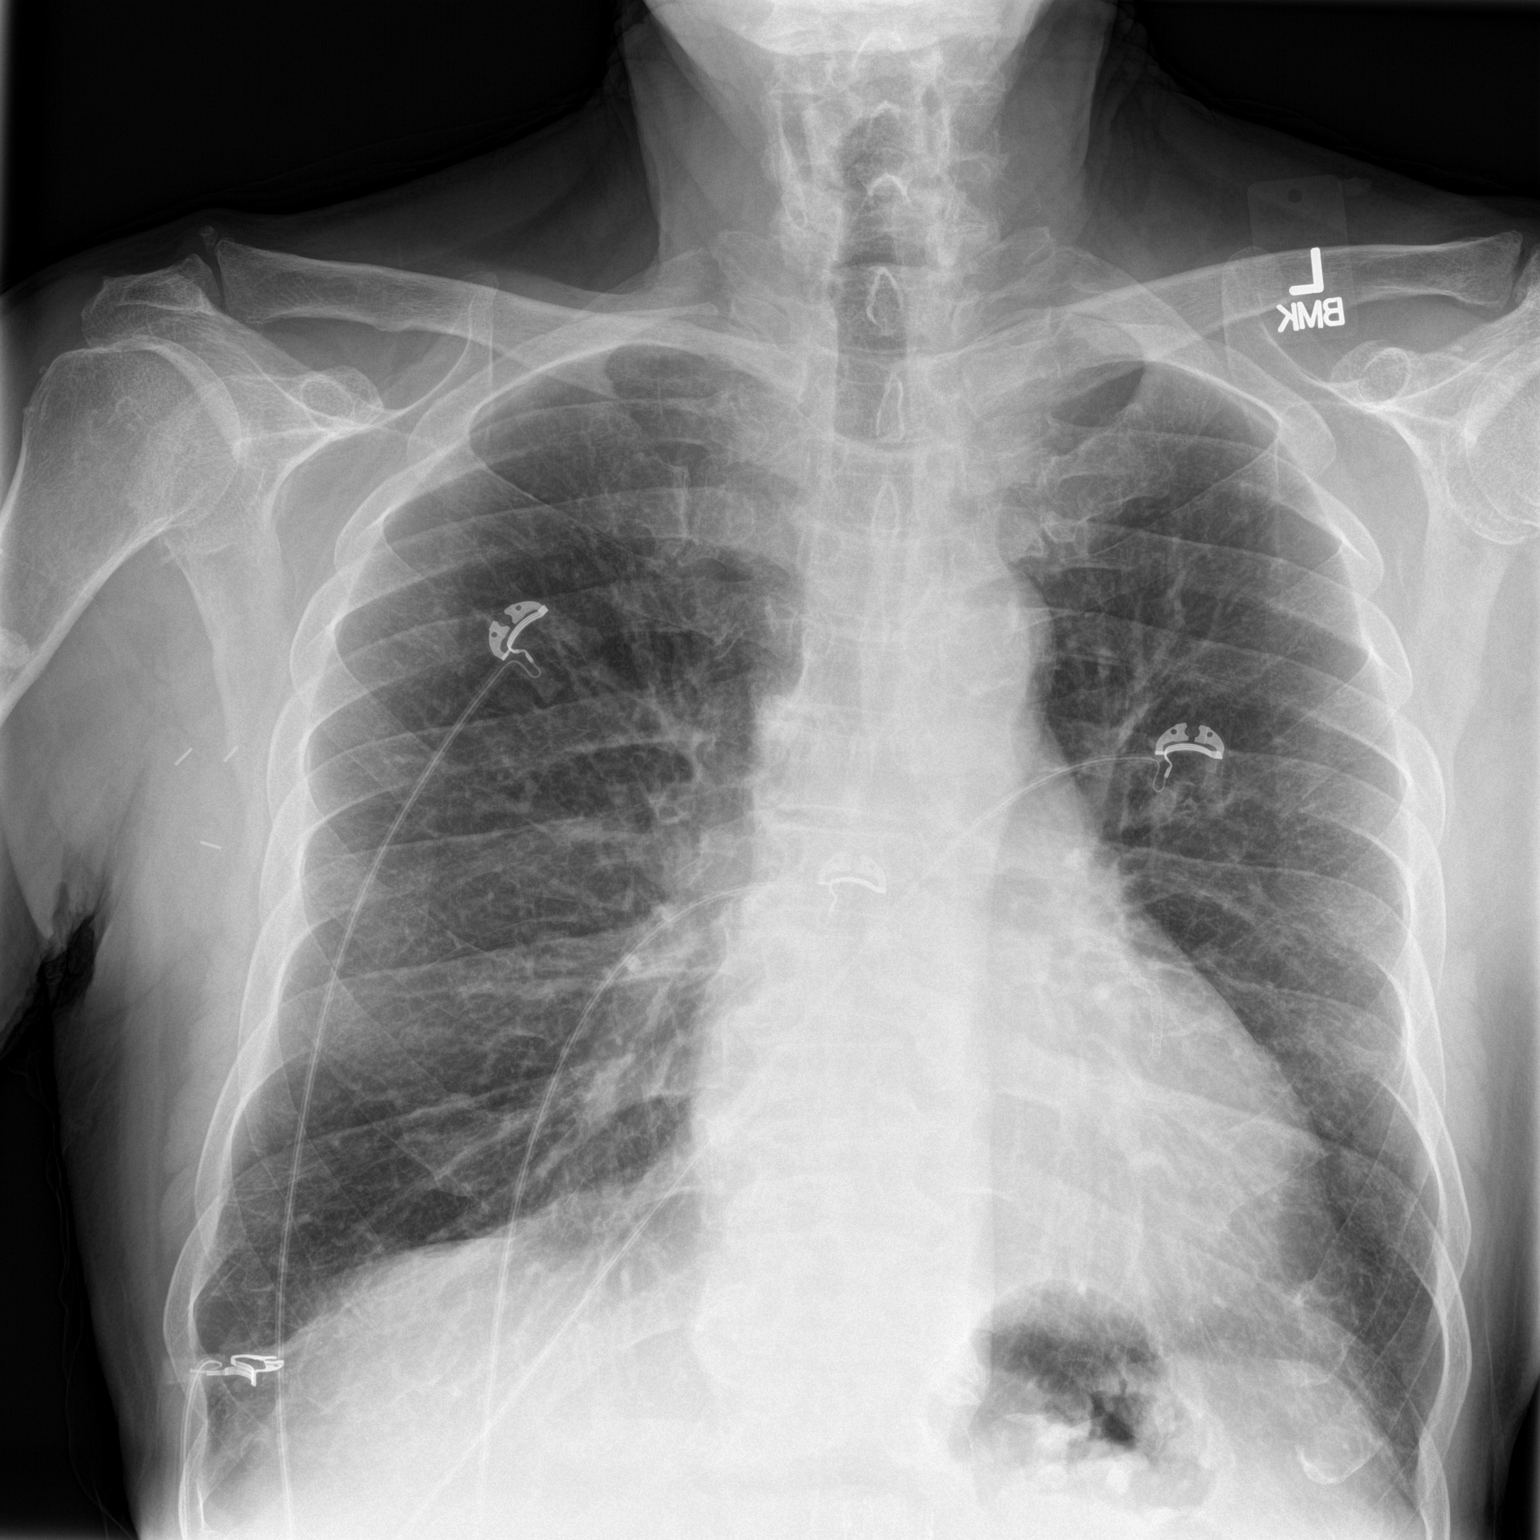
[im 2/2]
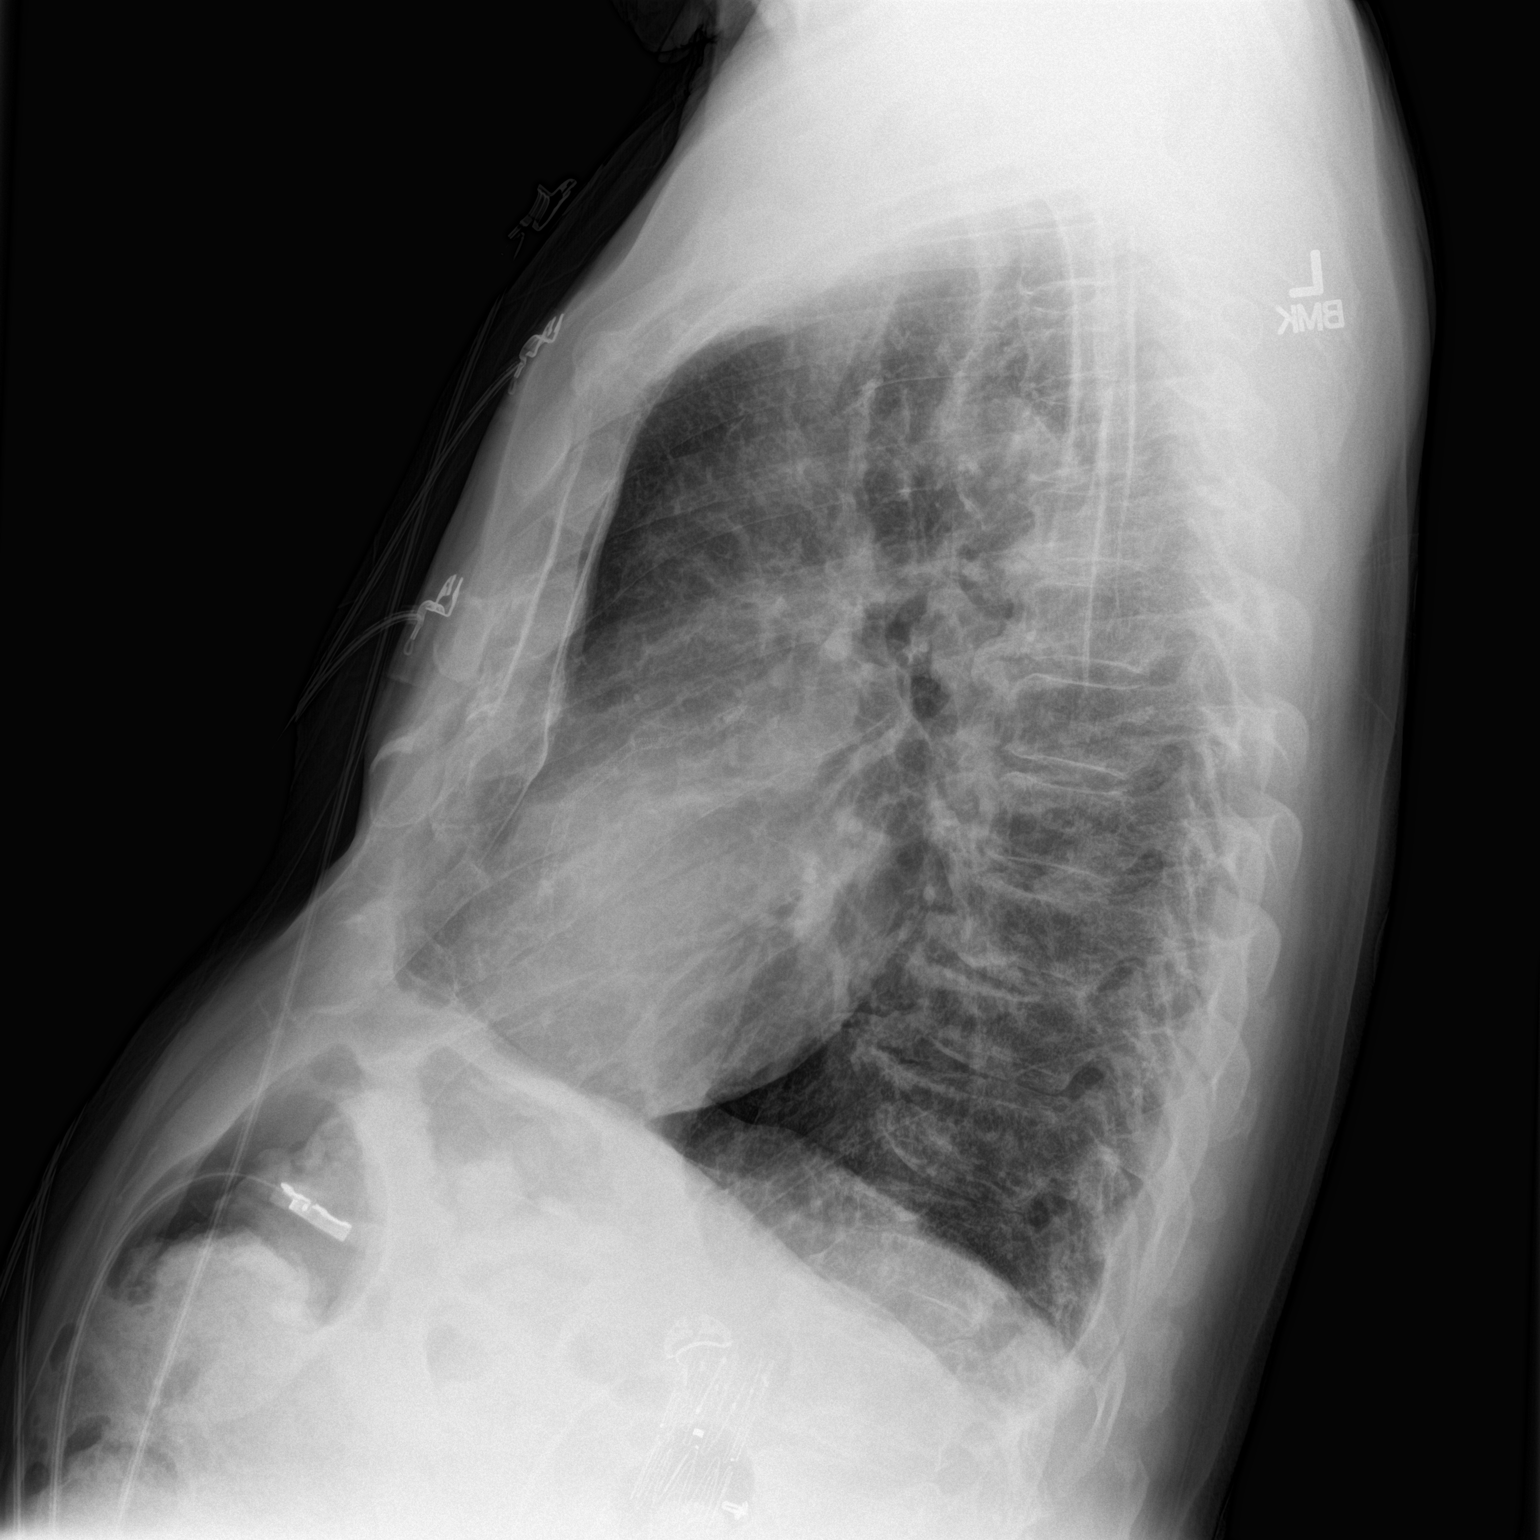

[2 of 2 positions shown; findings below may reference images not displayed]

FINDINGS: Cardiac shadow is within normal limits. The lungs are clear. Mild
chronic interstitial changes are again noted. Changes of aortic
calcification and prior aortic stent graft therapy are noted.
IMPRESSION: Chronic bronchitic changes without acute abnormality.

## 2017-04-04 IMAGING — CR DG CHEST 1V
1 series · 2 of 2 positions shown · non-contrast
Comparison: 04/10/2014, 03/30/2014

CLINICAL DATA: Central chest pain, onset in 30 minutes prior.

EXAM:
CHEST 1 VIEW

[Series 1: portable · 0.17mm/px · 2 of 2 slices shown]
[im 1/2]
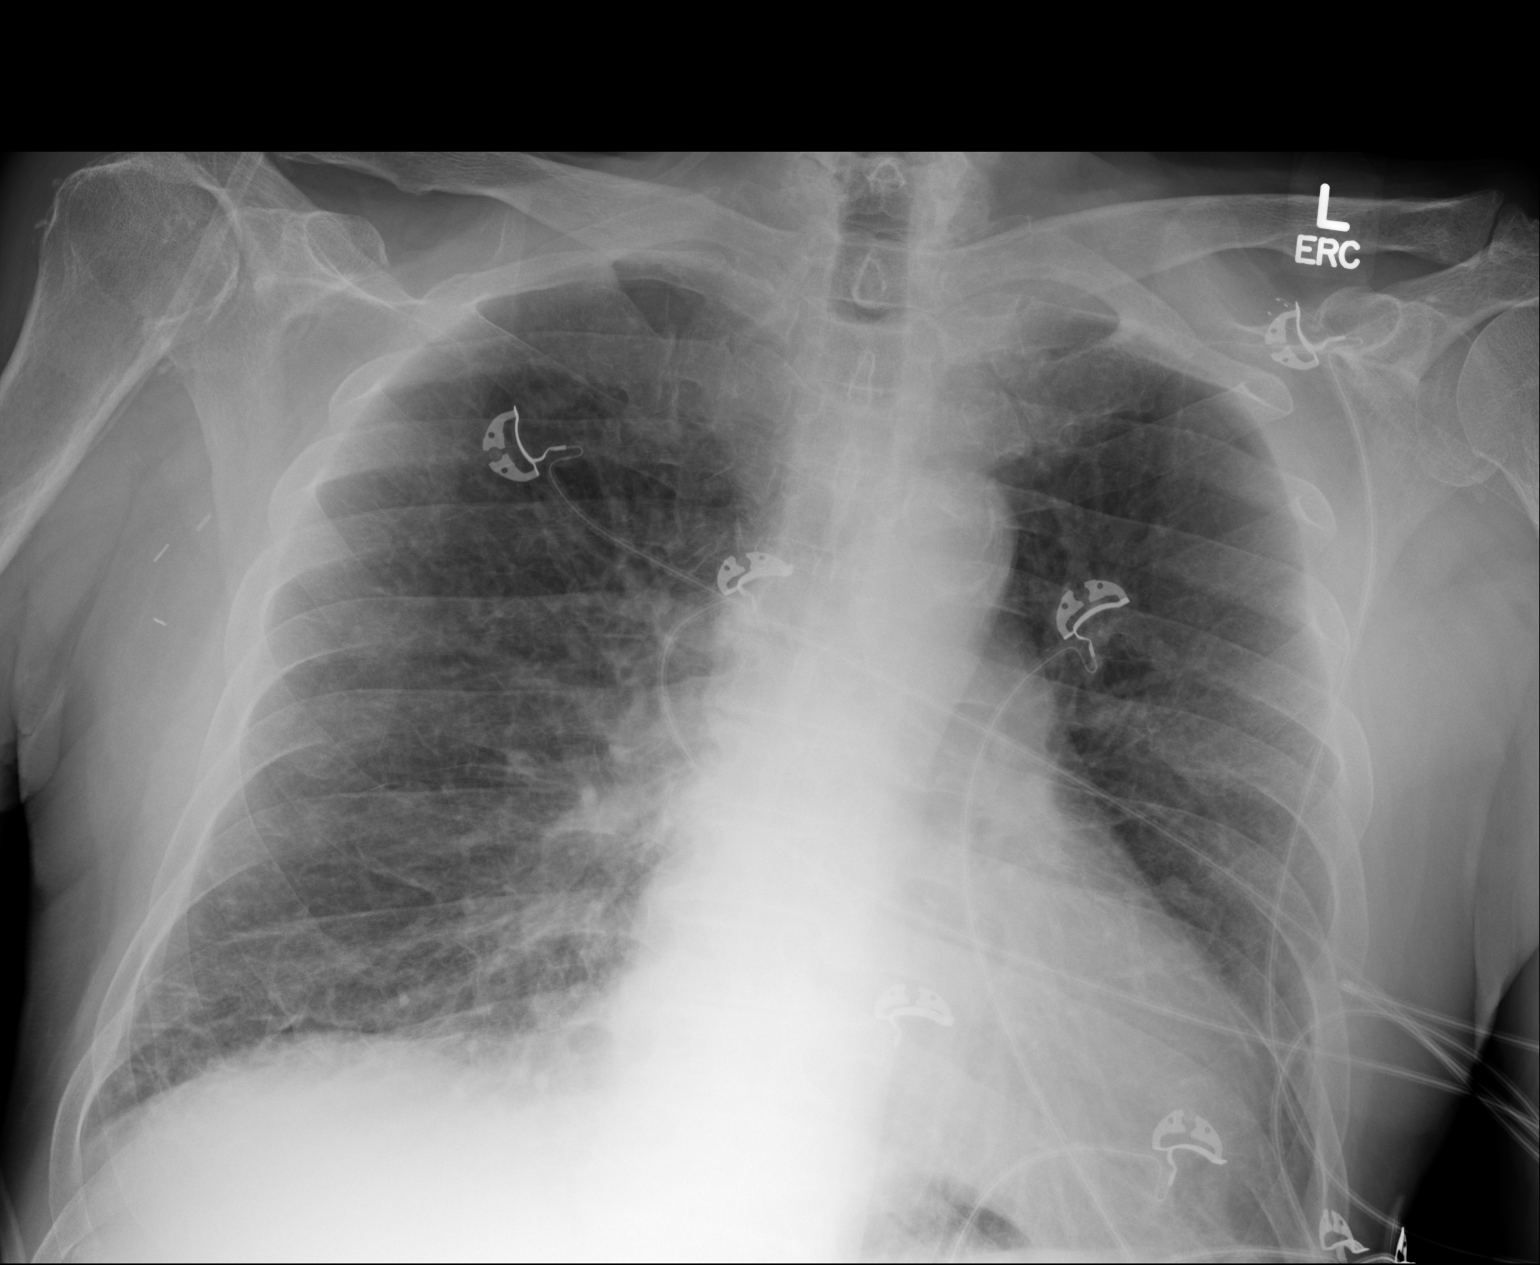
[im 2/2]
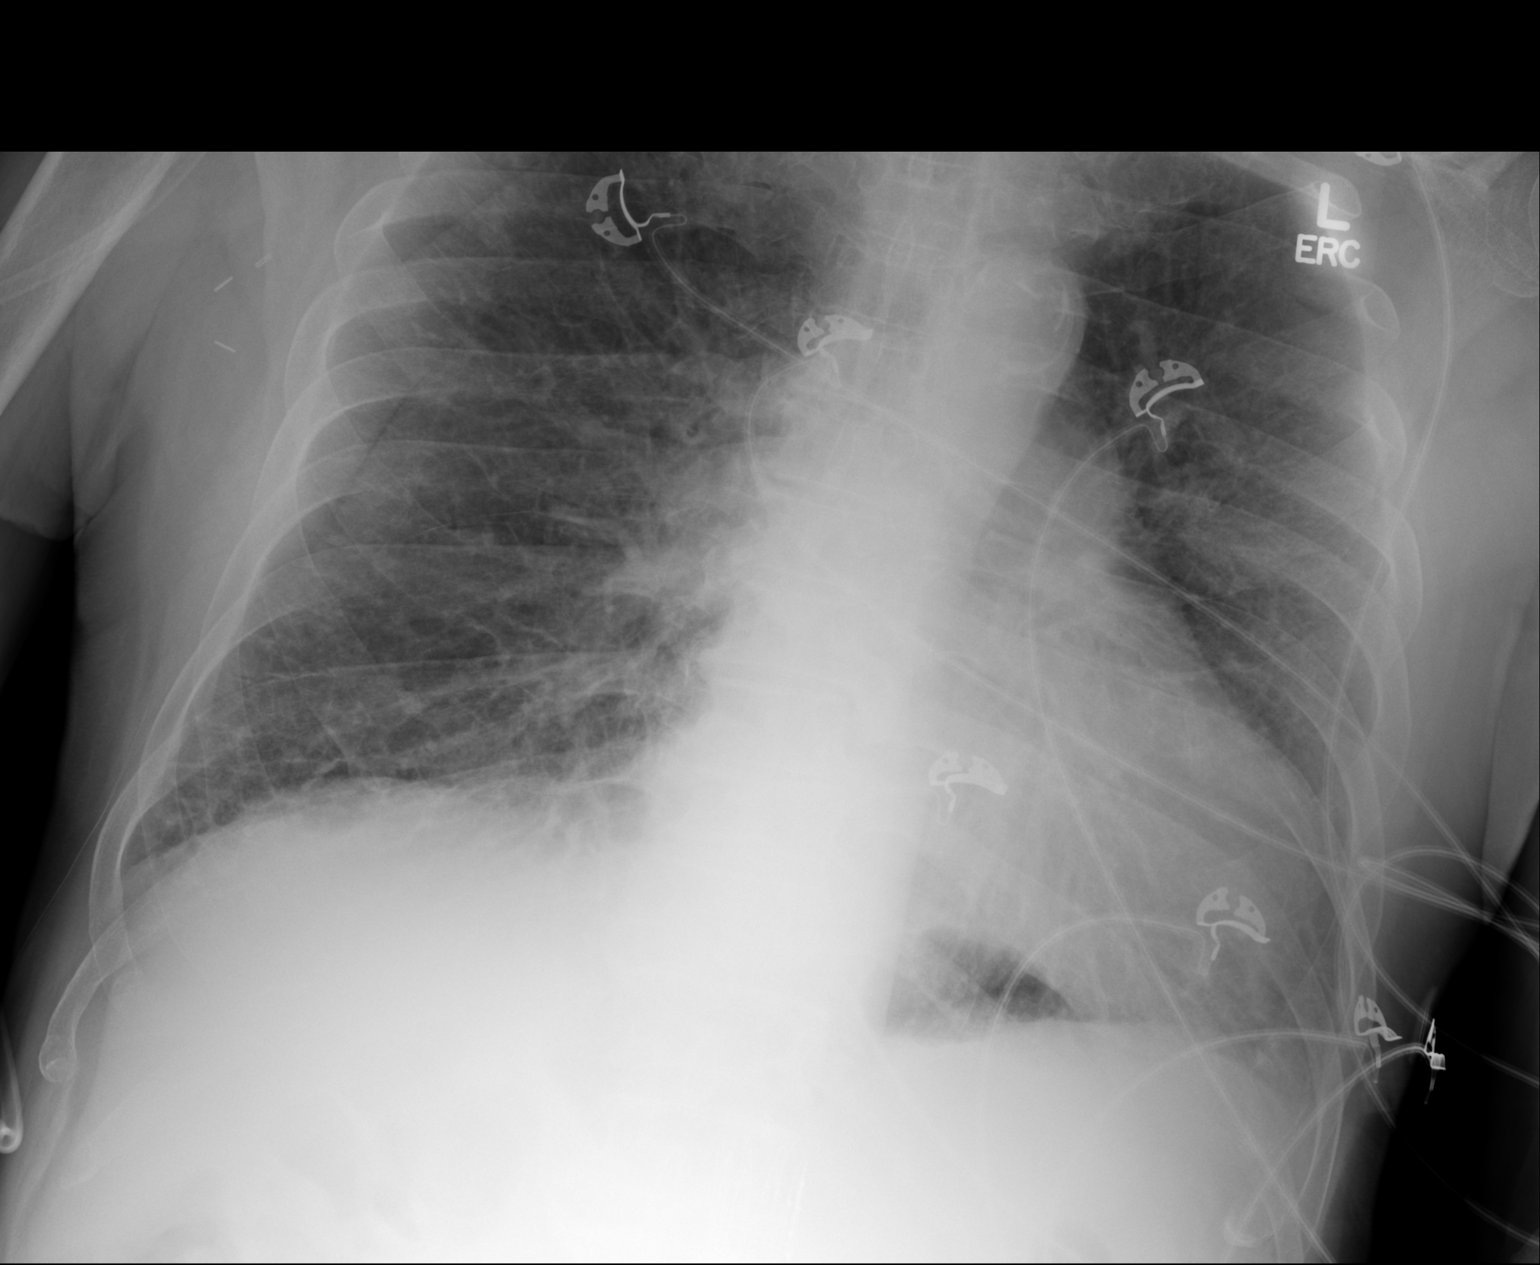

[2 of 2 positions shown; findings below may reference images not displayed]

FINDINGS: Borderline cardiomegaly. Increase in interstitial markings, suspect
edema superimposed on chronic change. Probable Kerley B-lines noted
at the right lung base, less likely scarring. No confluent airspace
disease to suggest pneumonia. No large pleural effusion or
pneumothorax. Multiple skin folds project over the right hemithorax.
IMPRESSION: Mild cardiomegaly and interstitial edema, suspect mild CHF;
superimposed on background chronic change.
